# Patient Record
Sex: Female | Born: 1975 | Race: White | Hispanic: No | Marital: Married | Smoking: Former smoker
Health system: Southern US, Community
[De-identification: ages and names within clinical notes are randomized; demographics above are authoritative.]

## PROBLEM LIST (undated history)

## (undated) DIAGNOSIS — G43909 Migraine, unspecified, not intractable, without status migrainosus: Secondary | ICD-10-CM

## (undated) DIAGNOSIS — G510 Bell's palsy: Secondary | ICD-10-CM

## (undated) DIAGNOSIS — F988 Other specified behavioral and emotional disorders with onset usually occurring in childhood and adolescence: Secondary | ICD-10-CM

## (undated) HISTORY — DX: Migraine, unspecified, not intractable, without status migrainosus: G43.909

## (undated) HISTORY — PX: TUBAL LIGATION: SHX77

## (undated) HISTORY — PX: INCONTINENCE SURGERY: SHX676

## (undated) HISTORY — PX: HEMORRHOID SURGERY: SHX153

## (undated) HISTORY — DX: Bell's palsy: G51.0

## (undated) HISTORY — DX: Other specified behavioral and emotional disorders with onset usually occurring in childhood and adolescence: F98.8

---

## 1999-08-12 ENCOUNTER — Inpatient Hospital Stay (HOSPITAL_COMMUNITY): Admission: AD | Admit: 1999-08-12 | Discharge: 1999-08-12 | Payer: Self-pay | Admitting: Obstetrics

## 2004-04-04 ENCOUNTER — Emergency Department (HOSPITAL_COMMUNITY): Admission: EM | Admit: 2004-04-04 | Discharge: 2004-04-04 | Payer: Self-pay | Admitting: Emergency Medicine

## 2004-04-07 ENCOUNTER — Emergency Department (HOSPITAL_COMMUNITY): Admission: EM | Admit: 2004-04-07 | Discharge: 2004-04-07 | Payer: Self-pay | Admitting: Emergency Medicine

## 2004-10-14 ENCOUNTER — Encounter: Admission: RE | Admit: 2004-10-14 | Discharge: 2004-10-14 | Payer: Self-pay | Admitting: Family Medicine

## 2005-06-12 ENCOUNTER — Emergency Department (HOSPITAL_COMMUNITY): Admission: EM | Admit: 2005-06-12 | Discharge: 2005-06-12 | Payer: Self-pay | Admitting: Emergency Medicine

## 2006-03-08 ENCOUNTER — Emergency Department (HOSPITAL_COMMUNITY): Admission: EM | Admit: 2006-03-08 | Discharge: 2006-03-09 | Payer: Self-pay | Admitting: *Deleted

## 2006-03-10 ENCOUNTER — Emergency Department (HOSPITAL_COMMUNITY): Admission: EM | Admit: 2006-03-10 | Discharge: 2006-03-10 | Payer: Self-pay | Admitting: Emergency Medicine

## 2006-09-11 ENCOUNTER — Emergency Department (HOSPITAL_COMMUNITY): Admission: EM | Admit: 2006-09-11 | Discharge: 2006-09-11 | Payer: Self-pay | Admitting: Emergency Medicine

## 2006-11-02 ENCOUNTER — Emergency Department (HOSPITAL_COMMUNITY): Admission: EM | Admit: 2006-11-02 | Discharge: 2006-11-02 | Payer: Self-pay | Admitting: Family Medicine

## 2009-06-11 ENCOUNTER — Emergency Department (HOSPITAL_BASED_OUTPATIENT_CLINIC_OR_DEPARTMENT_OTHER): Admission: EM | Admit: 2009-06-11 | Discharge: 2009-06-12 | Payer: Self-pay | Admitting: Emergency Medicine

## 2009-06-12 ENCOUNTER — Ambulatory Visit: Payer: Self-pay | Admitting: Diagnostic Radiology

## 2011-01-08 LAB — URINALYSIS, ROUTINE W REFLEX MICROSCOPIC
Glucose, UA: NEGATIVE mg/dL
Specific Gravity, Urine: 1.02 (ref 1.005–1.030)
Urobilinogen, UA: 0.2 mg/dL (ref 0.0–1.0)
pH: 6.5 (ref 5.0–8.0)

## 2011-01-08 LAB — CBC
HCT: 36.8 % (ref 36.0–46.0)
Hemoglobin: 12.6 g/dL (ref 12.0–15.0)
RBC: 4.01 MIL/uL (ref 3.87–5.11)

## 2011-01-08 LAB — GC/CHLAMYDIA PROBE AMP, GENITAL: Chlamydia, DNA Probe: NEGATIVE

## 2011-01-08 LAB — WET PREP, GENITAL
Trich, Wet Prep: NONE SEEN
Yeast Wet Prep HPF POC: NONE SEEN

## 2011-01-08 LAB — BASIC METABOLIC PANEL
CO2: 30 mEq/L (ref 19–32)
Calcium: 9.3 mg/dL (ref 8.4–10.5)
GFR calc Af Amer: >60 mL/min (ref >60)
GFR calc non Af Amer: >60 mL/min (ref >60)
Potassium: 4.5 mEq/L (ref 3.5–5.1)
Sodium: 142 mEq/L (ref 135–145)

## 2011-01-08 LAB — URINE MICROSCOPIC-ADD ON

## 2011-01-08 LAB — DIFFERENTIAL
Eosinophils Relative: 1 % (ref 0–5)
Lymphocytes Relative: 31 % (ref 12–46)
Lymphs Abs: 2.3 10*3/uL (ref 0.7–4.0)
Monocytes Absolute: 0.5 10*3/uL (ref 0.1–1.0)
Monocytes Relative: 6 % (ref 3–12)
Neutro Abs: 4.5 10*3/uL (ref 1.7–7.7)

## 2011-03-08 ENCOUNTER — Inpatient Hospital Stay (INDEPENDENT_AMBULATORY_CARE_PROVIDER_SITE_OTHER)
Admission: RE | Admit: 2011-03-08 | Discharge: 2011-03-08 | Disposition: A | Discharge status: Home / Self Care | Source: Ambulatory Visit | Attending: Emergency Medicine | Admitting: Emergency Medicine

## 2011-03-08 ENCOUNTER — Ambulatory Visit (INDEPENDENT_AMBULATORY_CARE_PROVIDER_SITE_OTHER)

## 2011-03-08 DIAGNOSIS — S8010XA Contusion of unspecified lower leg, initial encounter: Secondary | ICD-10-CM

## 2011-03-26 ENCOUNTER — Encounter: Payer: Self-pay | Admitting: Family Medicine

## 2011-03-26 DIAGNOSIS — G43909 Migraine, unspecified, not intractable, without status migrainosus: Secondary | ICD-10-CM | POA: Insufficient documentation

## 2011-07-25 ENCOUNTER — Encounter (HOSPITAL_BASED_OUTPATIENT_CLINIC_OR_DEPARTMENT_OTHER): Payer: Self-pay | Admitting: *Deleted

## 2011-07-25 ENCOUNTER — Emergency Department (HOSPITAL_BASED_OUTPATIENT_CLINIC_OR_DEPARTMENT_OTHER)
Admission: EM | Admit: 2011-07-25 | Discharge: 2011-07-25 | Disposition: A | Discharge status: Home / Self Care | Attending: Emergency Medicine | Admitting: Emergency Medicine

## 2011-07-25 DIAGNOSIS — R51 Headache: Secondary | ICD-10-CM | POA: Insufficient documentation

## 2011-07-25 DIAGNOSIS — R110 Nausea: Secondary | ICD-10-CM | POA: Insufficient documentation

## 2011-07-25 MED ORDER — DIPHENHYDRAMINE HCL 50 MG/ML IJ SOLN
25.0000 mg | Freq: Once | INTRAMUSCULAR | Status: AC
  Administered 2011-07-25: 25 mg via INTRAVENOUS
  Filled 2011-07-25: qty 1

## 2011-07-25 MED ORDER — LORAZEPAM 2 MG/ML IJ SOLN
INTRAMUSCULAR | Status: AC
  Administered 2011-07-25: 1 mg via INTRAVENOUS
  Filled 2011-07-25: qty 1

## 2011-07-25 MED ORDER — LORAZEPAM 2 MG/ML IJ SOLN
1.0000 mg | Freq: Once | INTRAMUSCULAR | Status: AC
  Administered 2011-07-25: 1 mg via INTRAVENOUS

## 2011-07-25 MED ORDER — KETOROLAC TROMETHAMINE 30 MG/ML IJ SOLN
30.0000 mg | Freq: Once | INTRAMUSCULAR | Status: AC
  Administered 2011-07-25: 30 mg via INTRAVENOUS
  Filled 2011-07-25: qty 1

## 2011-07-25 MED ORDER — METOCLOPRAMIDE HCL 5 MG/ML IJ SOLN
10.0000 mg | Freq: Once | INTRAMUSCULAR | Status: AC
  Administered 2011-07-25: 10 mg via INTRAVENOUS
  Filled 2011-07-25: qty 2

## 2011-07-25 NOTE — ED Notes (Signed)
Pt c/o feeling hot & "weird" after injections- NP made aware, order received

## 2011-07-25 NOTE — ED Notes (Signed)
Pt reports headache since 10/2- saw PCP last week and was given prednisone- reports headache has never gone away- also was given  maxalt on Friday without relief

## 2011-07-25 NOTE — ED Provider Notes (Signed)
History     CSN: [id number] Arrival date & time: 07/25/2011  3:56 PM   First MD Initiated Contact with Patient 07/25/11 1605      Chief Complaint  Patient presents with  . Headache    (Consider location/radiation/quality/duration/timing/severity/associated sxs/prior treatment) HPI Comments: Pt states that she has been having headache for 1 month:pt state that she was seen by her doctor 2 days ago and was given steroids and maxalt without relief  Patient is a 35 y.o. female presenting with headaches. The history is provided by the patient. No language interpreter was used.  Headache  This is a recurrent problem. The current episode started more than 1 week ago. The problem occurs constantly. The problem has not changed since onset.The headache is associated with bright light. The pain is located in the frontal region. The quality of the pain is described as throbbing. The pain is moderate. The pain does not radiate. Associated symptoms include nausea. Pertinent negatives include no fever, no near-syncope, no syncope, no shortness of breath and no vomiting.    Past Medical History  Diagnosis Date  . Migraines     Past Surgical History  Procedure Date  . Incontinence surgery   . Tubal ligation   . Hemorrhoid surgery     No family history on file.  History  Substance Use Topics  . Smoking status: Former Games developer  . Smokeless tobacco: Not on file  . Alcohol Use: No    OB History    Grav Para Term Preterm Abortions TAB SAB Ect Mult Living                  Review of Systems  Constitutional: Negative for fever.  Respiratory: Negative for shortness of breath.   Cardiovascular: Negative for syncope and near-syncope.  Gastrointestinal: Positive for nausea. Negative for vomiting.  Neurological: Positive for headaches.  All other systems reviewed and are negative.    Allergies  Codeine; Penicillins; and Treximet  Home Medications   Current Outpatient Rx  Name Route  Sig Dispense Refill  . ACETAMINOPHEN 500 MG PO TABS Oral Take 1,000 mg by mouth every 6 (six) hours as needed. For pain     . CLONAZEPAM 1 MG PO TABS Oral Take 1 mg by mouth 4 (four) times a week. Take Monday - Thursday     . HYDROCODONE-ACETAMINOPHEN 5-500 MG PO TABS Oral Take 1-2 tablets by mouth every 6 (six) hours as needed. For pain     . RIZATRIPTAN BENZOATE 10 MG PO TBDP Oral Take 10 mg by mouth as needed. May repeat in 2 hours if needed     . ALPRAZOLAM 0.25 MG PO TABS Oral Take 0.25 mg by mouth 3 (three) times daily as needed.      . METHYLPHENIDATE HCL 10 MG PO CP24 Oral Take 10 mg by mouth as needed.        BP 109/55  Pulse 70  Temp(Src) 98.6 F (37 C) (Oral)  Resp 20  Ht 5\' 4"  (1.626 m)  Wt 140 lb (63.504 kg)  BMI 24.03 kg/m2  SpO2 98%  LMP 07/25/2011  Physical Exam  Nursing note and vitals reviewed. Constitutional: She is oriented to person, place, and time. She appears well-developed and well-nourished.  HENT:  Head: Normocephalic and atraumatic.  Right Ear: External ear normal.  Left Ear: External ear normal.  Mouth/Throat: Oropharynx is clear and moist.  Eyes: Conjunctivae are normal. Pupils are equal, round, and reactive to light.  Neck: Normal range  of motion.  Cardiovascular: Normal rate and regular rhythm.   Pulmonary/Chest: Effort normal and breath sounds normal.  Musculoskeletal: Normal range of motion.  Neurological: She is alert and oriented to person, place, and time.  Skin: Skin is warm and dry.  Psychiatric: She has a normal mood and affect.    ED Course  Procedures (including critical care time)  Labs Reviewed - No data to display No results found.   1. Headache       MDM  Pt is feeling better and ready to go home:pt sleeping in room   Medical screening examination/treatment/procedure(s) were performed by non-physician practitioner and as supervising physician I was immediately available for consultation/collaboration. Osvaldo Human, M.D.     Teressa Lower, NP 07/25/11 1827  Carleene Cooper III, MD 07/27/11 [phone]

## 2011-10-05 HISTORY — PX: BREAST ENHANCEMENT SURGERY: SHX7

## 2012-05-15 ENCOUNTER — Other Ambulatory Visit: Payer: Self-pay | Admitting: Family Medicine

## 2012-05-15 ENCOUNTER — Ambulatory Visit
Admission: RE | Admit: 2012-05-15 | Discharge: 2012-05-15 | Disposition: A | Discharge status: Home / Self Care | Source: Ambulatory Visit | Attending: Family Medicine | Admitting: Family Medicine

## 2012-05-15 DIAGNOSIS — R52 Pain, unspecified: Secondary | ICD-10-CM

## 2012-10-26 ENCOUNTER — Emergency Department (HOSPITAL_BASED_OUTPATIENT_CLINIC_OR_DEPARTMENT_OTHER)
Admission: EM | Admit: 2012-10-26 | Discharge: 2012-10-26 | Disposition: A | Discharge status: Home / Self Care | Attending: Emergency Medicine | Admitting: Emergency Medicine

## 2012-10-26 ENCOUNTER — Encounter (HOSPITAL_BASED_OUTPATIENT_CLINIC_OR_DEPARTMENT_OTHER): Payer: Self-pay | Admitting: *Deleted

## 2012-10-26 DIAGNOSIS — Z8679 Personal history of other diseases of the circulatory system: Secondary | ICD-10-CM | POA: Insufficient documentation

## 2012-10-26 DIAGNOSIS — Z87891 Personal history of nicotine dependence: Secondary | ICD-10-CM | POA: Insufficient documentation

## 2012-10-26 DIAGNOSIS — J329 Chronic sinusitis, unspecified: Secondary | ICD-10-CM

## 2012-10-26 DIAGNOSIS — Z79899 Other long term (current) drug therapy: Secondary | ICD-10-CM | POA: Insufficient documentation

## 2012-10-26 MED ORDER — LEVOFLOXACIN 750 MG PO TABS: 750.0000 mg | ORAL_TABLET | Freq: Every day | ORAL | Status: DC

## 2012-10-26 MED ORDER — FLUTICASONE PROPIONATE 50 MCG/ACT NA SUSP: 2.0000 | Freq: Every day | NASAL | Status: DC

## 2012-10-26 NOTE — ED Notes (Signed)
Pt c/o h/a and sinus congestion x 1 day

## 2012-10-26 NOTE — ED Provider Notes (Signed)
Medical screening examination/treatment/procedure(s) were performed by non-physician practitioner and as supervising physician I was immediately available for consultation/collaboration.   Lakresha Stifter, MD 10/26/12 2333 

## 2012-10-26 NOTE — ED Provider Notes (Signed)
History     CSN: [id number]  Arrival date & time 10/26/12  1845   First MD Initiated Contact with Patient 10/26/12 1858      Chief Complaint  Patient presents with  . Headache    (Consider location/radiation/quality/duration/timing/severity/associated sxs/prior treatment) HPI Comments: Pt states that she has a history of sinusitis and she feels like the symptoms started again today:pt denies fever:pt state that she is followed by [facility]  Patient is a 37 y.o. female presenting with headaches. The history is provided by the patient. No language interpreter was used.  Headache  This is a new problem. The current episode started 2 days ago. The problem occurs constantly. The problem has not changed since onset.The pain is located in the frontal region. The quality of the pain is described as throbbing. The pain is moderate. The pain does not radiate.    Past Medical History  Diagnosis Date  . Migraines     Past Surgical History  Procedure Date  . Incontinence surgery   . Tubal ligation   . Hemorrhoid surgery     History reviewed. No pertinent family history.  History  Substance Use Topics  . Smoking status: Former Games developer  . Smokeless tobacco: Not on file  . Alcohol Use: No    OB History    Grav Para Term Preterm Abortions TAB SAB Ect Mult Living                  Review of Systems  Constitutional: Negative.   Respiratory: Negative.   Cardiovascular: Negative.   Neurological: Positive for headaches.    Allergies  Codeine; Penicillins; and Treximet  Home Medications   Current Outpatient Rx  Name  Route  Sig  Dispense  Refill  . ACETAMINOPHEN 500 MG PO TABS   Oral   Take 1,000 mg by mouth every 6 (six) hours as needed. For pain          . TOPIRAMATE 100 MG PO TABS   Oral   Take 100 mg by mouth 2 (two) times daily.         Marland Kitchen ALPRAZOLAM 0.25 MG PO TABS   Oral   Take 0.25 mg by mouth 3 (three) times daily as needed.           Marland Kitchen  CLONAZEPAM 1 MG PO TABS   Oral   Take 1 mg by mouth 4 (four) times a week. Take Monday - Thursday          . FLUTICASONE PROPIONATE 50 MCG/ACT NA SUSP   Nasal   Place 2 sprays into the nose daily.   16 g   2   . HYDROCODONE-ACETAMINOPHEN 5-500 MG PO TABS   Oral   Take 1-2 tablets by mouth every 6 (six) hours as needed. For pain          . LEVOFLOXACIN 750 MG PO TABS   Oral   Take 1 tablet (750 mg total) by mouth daily.   5 tablet   0   . METHYLPHENIDATE HCL ER (LA) 10 MG PO CP24   Oral   Take 10 mg by mouth as needed.           Marland Kitchen RIZATRIPTAN BENZOATE 10 MG PO TBDP   Oral   Take 10 mg by mouth as needed. May repeat in 2 hours if needed            BP 124/74  Pulse 83  Temp 98.2 F (36.8 C) (Oral)  Resp 16  Ht 5\' 4"  (1.626 m)  Wt 143 lb (64.864 kg)  BMI 24.55 kg/m2  SpO2 100%  LMP 10/05/2012  Physical Exam  Nursing note and vitals reviewed. Constitutional: She appears well-developed and well-nourished.  HENT:  Right Ear: External ear normal.  Left Ear: External ear normal.  Nose: Mucosal edema present. Right sinus exhibits frontal sinus tenderness. Left sinus exhibits frontal sinus tenderness.  Neck: Normal range of motion. Neck supple.  Cardiovascular: Normal rate and regular rhythm.   Pulmonary/Chest: Effort normal and breath sounds normal.  Musculoskeletal: Normal range of motion.  Neurological: She is alert.  Skin: Skin is warm and dry.  Psychiatric: She has a normal mood and affect.    ED Course  Procedures (including critical care time)  Labs Reviewed - No data to display No results found.   1. Sinusitis       MDM  Pt treat for sinusitis with levaquin and flonase        Teressa Lower, NP 10/26/12 2112

## 2012-10-26 NOTE — ED Notes (Signed)
Pt reports waking up with severe headache around 7 am, reports nose bleed to left nare this am bleeding controlled at that time, no bleeding noted at this time. Pt has taken tylenol 3 times today last being around 1600 with no relief.

## 2012-11-04 ENCOUNTER — Emergency Department (HOSPITAL_COMMUNITY)
Admission: EM | Admit: 2012-11-04 | Discharge: 2012-11-04 | Disposition: A | Discharge status: Home / Self Care | Attending: Emergency Medicine | Admitting: Emergency Medicine

## 2012-11-04 ENCOUNTER — Emergency Department (HOSPITAL_COMMUNITY)

## 2012-11-04 ENCOUNTER — Encounter (HOSPITAL_COMMUNITY): Payer: Self-pay | Admitting: Emergency Medicine

## 2012-11-04 DIAGNOSIS — M5410 Radiculopathy, site unspecified: Secondary | ICD-10-CM

## 2012-11-04 DIAGNOSIS — M5412 Radiculopathy, cervical region: Secondary | ICD-10-CM | POA: Insufficient documentation

## 2012-11-04 DIAGNOSIS — Z79899 Other long term (current) drug therapy: Secondary | ICD-10-CM | POA: Insufficient documentation

## 2012-11-04 DIAGNOSIS — G43909 Migraine, unspecified, not intractable, without status migrainosus: Secondary | ICD-10-CM | POA: Insufficient documentation

## 2012-11-04 DIAGNOSIS — Z87891 Personal history of nicotine dependence: Secondary | ICD-10-CM | POA: Insufficient documentation

## 2012-11-04 DIAGNOSIS — M25512 Pain in left shoulder: Secondary | ICD-10-CM

## 2012-11-04 MED ORDER — HYDROCODONE-ACETAMINOPHEN 5-325 MG PO TABS: 2.0000 | ORAL_TABLET | ORAL | Status: DC | PRN

## 2012-11-04 MED ORDER — PREDNISONE 20 MG PO TABS: 40.0000 mg | ORAL_TABLET | Freq: Every day | ORAL | Status: DC

## 2012-11-04 MED ORDER — NAPROXEN 500 MG PO TABS: 500.0000 mg | ORAL_TABLET | Freq: Two times a day (BID) | ORAL | Status: DC

## 2012-11-04 NOTE — ED Notes (Signed)
Pt c/o lt shoulder pain since yesterday.  States that it only hurts when she lifts it above her head.  Does not remember what she did to it.

## 2012-11-04 NOTE — ED Provider Notes (Signed)
History   This chart was scribed for non-physician practitioner working with Hurman Horn, MD by Frederik Pear, ED Scribe. This patient was seen in room WTR7/WTR7 and the patient's care was started at 1749.   CSN: [id number]  Arrival date & time 11/04/12  1627   First MD Initiated Contact with Patient 11/04/12 1749      Chief Complaint  Patient presents with  . Shoulder Pain    (Consider location/radiation/quality/duration/timing/severity/associated sxs/prior treatment) The history is provided by the patient. No language interpreter was used.    Tracey Malone is a 37 y.o. female who presents to the Emergency Department complaining of constant, moderate left anterior shoulder pain that is aggravated by lifting it above her head that began last night. She denies paresthesia to her left area, but reports that the sensation feels different than baseline. She denies any injury to the area as well having lifted any boxes, weights, children, or other heavy objects. She reports that she has treated the pain with Tylenol with no relief. She reports a h/o of pain in other joints. She has no chronic medical conditions, including arthritis, that she require daily medications.  She rates her pain as moderate, located in the left shoulder with radiation down the left arm, worsens with movement and nothing makes it better.  It is associated with numbness of the left hand and arm.    Past Medical History  Diagnosis Date  . Migraines     Past Surgical History  Procedure Date  . Incontinence surgery   . Tubal ligation   . Hemorrhoid surgery     History reviewed. No pertinent family history.  History  Substance Use Topics  . Smoking status: Former Games developer  . Smokeless tobacco: Not on file  . Alcohol Use: No    OB History    Grav Para Term Preterm Abortions TAB SAB Ect Mult Living                  Review of Systems  Constitutional: Negative for fever, diaphoresis, appetite change,  fatigue and unexpected weight change.  HENT: Negative for mouth sores and neck stiffness.   Eyes: Negative for visual disturbance.  Respiratory: Negative for cough, chest tightness, shortness of breath and wheezing.   Cardiovascular: Negative for chest pain.  Gastrointestinal: Negative for nausea, vomiting, abdominal pain, diarrhea and constipation.  Genitourinary: Negative for dysuria, urgency, frequency and hematuria.  Musculoskeletal: Negative for back pain.       Shoulder pain  Skin: Negative for rash.  Neurological: Negative for syncope, light-headedness and headaches.  Hematological: Does not bruise/bleed easily.  Psychiatric/Behavioral: Negative for sleep disturbance. The patient is not nervous/anxious.   All other systems reviewed and are negative.    Allergies  Codeine; Penicillins; and Treximet  Home Medications   Current Outpatient Rx  Name  Route  Sig  Dispense  Refill  . ACETAMINOPHEN 500 MG PO TABS   Oral   Take 1,000 mg by mouth every 6 (six) hours as needed. For pain          . FLUTICASONE PROPIONATE 50 MCG/ACT NA SUSP   Nasal   Place 2 sprays into the nose daily.   16 g   2   . LEVOFLOXACIN 750 MG PO TABS   Oral   Take 1 tablet (750 mg total) by mouth daily.   5 tablet   0   . TOPIRAMATE 100 MG PO TABS   Oral   Take 100 mg  by mouth 2 (two) times daily.         Marland Kitchen HYDROCODONE-ACETAMINOPHEN 5-325 MG PO TABS   Oral   Take 2 tablets by mouth every 4 (four) hours as needed for pain.   10 tablet   0   . NAPROXEN 500 MG PO TABS   Oral   Take 1 tablet (500 mg total) by mouth 2 (two) times daily with a meal.   30 tablet   0   . PREDNISONE 20 MG PO TABS   Oral   Take 2 tablets (40 mg total) by mouth daily.   10 tablet   0     BP 115/67  Pulse 87  Temp 98 F (36.7 C) (Oral)  Resp 18  SpO2 100%  LMP 10/05/2012  Physical Exam  Nursing note and vitals reviewed. Constitutional: She is oriented to person, place, and time. She appears  well-developed and well-nourished. No distress.  HENT:  Head: Normocephalic and atraumatic.  Mouth/Throat: Oropharynx is clear and moist. No oropharyngeal exudate.  Eyes: Conjunctivae normal are normal. No scleral icterus.  Neck: Normal range of motion. Neck supple.  Cardiovascular: Normal rate, regular rhythm and intact distal pulses.   Pulmonary/Chest: Effort normal and breath sounds normal. No respiratory distress. She has no wheezes.  Abdominal: Soft. Bowel sounds are normal. She exhibits no mass. There is no tenderness. There is no rebound and no guarding.  Musculoskeletal: Normal range of motion. She exhibits no edema.       Some decreased ROM secondary to pain with adduction.  Adduction more decreased with active than passive. Full rotational ROM. Strong equal grip strength bilaterally. Strength 5/5 in all nerve distributions.  Decreased sensation to sharp and dull in the radial distribution in the left hand and forearm. Intact 2 point discrimination in all nerve distribution.   Lymphadenopathy:    She has no cervical adenopathy.  Neurological: She is alert and oriented to person, place, and time. No cranial nerve deficit. She exhibits normal muscle tone. Coordination normal.       Speech is clear and goal oriented Moves extremities without ataxia  Skin: Skin is warm and dry. She is not diaphoretic.  Psychiatric: She has a normal mood and affect.    ED Course  Procedures (including critical care time)  DIAGNOSTIC STUDIES: Oxygen Saturation is 100% on room air, normal by my interpretation.    COORDINATION OF CARE:  20:20- Discussed planned course of treatment with the patient, including antiinflammatories, pain and following up with an orthopedist, who is agreeable at this time.   Labs Reviewed - No data to display Dg Shoulder Left  11/04/2012  *RADIOLOGY REPORT*  Clinical Data: Left shoulder pain, no known injury  LEFT SHOULDER - 2+ VIEW  Comparison: None.  Findings: No  fracture or dislocation is seen.  The joint spaces are preserved.  The visualized soft tissues are unremarkable.  Visualized left lung is clear.  IMPRESSION: Normal left shoulder radiographs.   Original Report Authenticated By: Charline Bills, M.D.      1. Left anterior shoulder pain   2. Radiculopathy of arm       MDM  Ty Hilts presents with nontraumatic L shoulder pain.  X-ray without bony abnormality.  Pt with radicular symptoms on exam and decreased ROM 2/2 pain.  Pt without neck pain but this does not exclude a cervical radiculopathy.  Will sling arm for comfort and d/c home with steroids, pain medication and anti-inflammatories. Pt has been instructed to  f/u with orthopedics for futher evaluation and outpatient imaging.  I have also discussed reasons to return immediately to the ER.  Patient expresses understanding and agrees with plan.  1. Medications: Naprosyn, Vicodin, prednisone, usual home medications 2. Treatment: rest, drink plenty of fluids, use sling for comfort 3. Follow Up: Please followup with your primary doctor for discussion of your diagnoses and further evaluation after today's visit; followup with [facility] orthopedics for further evaluation of your shoulder pain   I personally performed the services described in this documentation, which was scribed in my presence. The recorded information has been reviewed and is accurate.     Dahlia Client Grayling Schranz, PA-C 11/05/12 0030

## 2012-11-05 NOTE — ED Provider Notes (Signed)
Medical screening examination/treatment/procedure(s) were performed by non-physician practitioner and as supervising physician I was immediately available for consultation/collaboration.  Hurman Horn, MD 11/05/12 1215

## 2013-01-26 ENCOUNTER — Ambulatory Visit (INDEPENDENT_AMBULATORY_CARE_PROVIDER_SITE_OTHER): Admitting: Physician Assistant

## 2013-01-26 ENCOUNTER — Encounter: Payer: Self-pay | Admitting: Physician Assistant

## 2013-01-26 VITALS — BP 132/86 | HR 108 | Temp 98.2°F | Resp 18 | Ht 63.75 in | Wt 146.0 lb

## 2013-01-26 DIAGNOSIS — B9689 Other specified bacterial agents as the cause of diseases classified elsewhere: Secondary | ICD-10-CM

## 2013-01-26 DIAGNOSIS — J988 Other specified respiratory disorders: Secondary | ICD-10-CM

## 2013-01-26 DIAGNOSIS — A499 Bacterial infection, unspecified: Secondary | ICD-10-CM

## 2013-01-26 MED ORDER — AZITHROMYCIN 250 MG PO TABS: ORAL_TABLET | ORAL | Status: DC

## 2013-01-26 MED ORDER — BENZONATATE 200 MG PO CAPS: 200.0000 mg | ORAL_CAPSULE | Freq: Two times a day (BID) | ORAL | Status: DC | PRN

## 2013-01-26 NOTE — Progress Notes (Signed)
Patient ID: QUEEN ABBETT MRN: [id number], DOB: 11/22/1975, 37 y.o. Date of Encounter: 01/26/2013, 2:48 PM    Chief Complaint:  Chief Complaint  Patient presents with  . c/o upper respiratory     HPI: 37 y.o. year old female says she has "gotten same thing her husband has." Her husband has been sick for a full week and has thick dark phlegm.  Pt has lots of drainage in throat but has not been able to cough out any phlegm. Has cough. Has a lot of pressure in head. Not blowing much from nose. This morning left ear felt like "fluid behind it." No sore throat.No other ear pain. No fever/chills.      Home Meds: Current Outpatient Prescriptions on File Prior to Visit  Medication Sig Dispense Refill  . acetaminophen (TYLENOL) 500 MG tablet Take 1,000 mg by mouth every 6 (six) hours as needed. For pain       . topiramate (TOPAMAX) 100 MG tablet Take 100 mg by mouth 2 (two) times daily.      . fluticasone (FLONASE) 50 MCG/ACT nasal spray Place 2 sprays into the nose daily.  16 g  2  . HYDROcodone-acetaminophen (NORCO/VICODIN) 5-325 MG per tablet Take 2 tablets by mouth every 4 (four) hours as needed for pain.  10 tablet  0  . levofloxacin (LEVAQUIN) 750 MG tablet Take 1 tablet (750 mg total) by mouth daily.  5 tablet  0  . naproxen (NAPROSYN) 500 MG tablet Take 1 tablet (500 mg total) by mouth 2 (two) times daily with a meal.  30 tablet  0  . predniSONE (DELTASONE) 20 MG tablet Take 2 tablets (40 mg total) by mouth daily.  10 tablet  0   No current facility-administered medications on file prior to visit.    Allergies:  Allergies  Allergen Reactions  . Codeine Shortness Of Breath  . Penicillins Swelling  . Treximet (Sumatriptan-Naproxen Sodium)     Throat gets tight.      Review of Systems: See HPI for pertinent ros. All others negative.    Physical Exam: Blood pressure 132/86, pulse 108, temperature 98.2 F (36.8 C), temperature source Oral, resp. rate 18, height 5' 3.75"  (1.619 m), weight 146 lb (66.225 kg)., Body mass index is 25.27 kg/(m^2). General: Well developed, well nourished, in no acute distress. HEENT: Normocephalic, atraumatic, eyes without discharge, sclera non-icteric, nares are without discharge. Bilateral auditory canals clear, TM's are without perforation, pearly grey and translucent with reflective cone of light bilaterally. Oral cavity moist, posterior pharynx without exudate, erythema, peritonsillar abscess, or post nasal drip.Sinuses with no tenderness with percusion.  Neck: Supple. No thyromegaly. Full ROM. No lymphadenopathy. Lungs: Clear bilaterally to auscultation without wheezes, rales, or rhonchi. Breathing is unlabored. Heart: Regular rhythm. No murmurs, rubs, or gallops. Msk:  Strength and tone normal for age. Extremities/Skin: Warm and dry. No clubbing or cyanosis. No edema. No rashes or suspicious lesions. Neuro: Alert and oriented X 3. Moves all extremities spontaneously. Gait is normal. CNII-XII grossly in tact. Psych:  Responds to questions appropriately with a normal affect.     ASSESSMENT AND PLAN:  37 y.o. year old female with  1. Bacterial respiratory infection - azithromycin (ZITHROMAX) 250 MG tablet; Day 1: Take 2 pills daily Days 2-5: Take one pill daily  Dispense: 6 tablet; Refill: 0 - benzonatate (TESSALON) 200 MG capsule; Take 1 capsule (200 mg total) by mouth 2 (two) times daily as needed for cough.  Dispense: 20 capsule;  Refill: 0  F/u if worsens/does not resolve.  Murray Hodgkins [place], [place], [facility] 01/26/2013 2:48 PM

## 2013-03-13 ENCOUNTER — Other Ambulatory Visit: Payer: Self-pay | Admitting: Family Medicine

## 2013-03-13 NOTE — Telephone Encounter (Signed)
?   OK to Refill  

## 2013-03-13 NOTE — Telephone Encounter (Signed)
Ok to refill 

## 2013-06-27 ENCOUNTER — Telehealth: Payer: Self-pay | Admitting: Family Medicine

## 2013-06-27 ENCOUNTER — Encounter: Payer: Self-pay | Admitting: Family Medicine

## 2013-06-27 ENCOUNTER — Other Ambulatory Visit: Payer: Self-pay | Admitting: Family Medicine

## 2013-06-27 ENCOUNTER — Ambulatory Visit (INDEPENDENT_AMBULATORY_CARE_PROVIDER_SITE_OTHER): Admitting: Family Medicine

## 2013-06-27 VITALS — BP 100/60 | HR 80 | Temp 97.3°F | Resp 16 | Wt 147.5 lb

## 2013-06-27 DIAGNOSIS — N3090 Cystitis, unspecified without hematuria: Secondary | ICD-10-CM

## 2013-06-27 DIAGNOSIS — Z23 Encounter for immunization: Secondary | ICD-10-CM

## 2013-06-27 DIAGNOSIS — K219 Gastro-esophageal reflux disease without esophagitis: Secondary | ICD-10-CM

## 2013-06-27 DIAGNOSIS — F988 Other specified behavioral and emotional disorders with onset usually occurring in childhood and adolescence: Secondary | ICD-10-CM

## 2013-06-27 DIAGNOSIS — N390 Urinary tract infection, site not specified: Secondary | ICD-10-CM

## 2013-06-27 LAB — URINALYSIS, ROUTINE W REFLEX MICROSCOPIC
Glucose, UA: NEGATIVE mg/dL
Specific Gravity, Urine: 1.015 (ref 1.005–1.030)
pH: 7 (ref 5.0–8.0)

## 2013-06-27 LAB — URINALYSIS, MICROSCOPIC ONLY
Casts: NONE SEEN
Crystals: NONE SEEN

## 2013-06-27 MED ORDER — DEXLANSOPRAZOLE 30 MG PO CPDR: 30.0000 mg | DELAYED_RELEASE_CAPSULE | Freq: Every day | ORAL | Status: DC

## 2013-06-27 MED ORDER — METHYLPHENIDATE HCL ER (LA) 10 MG PO CP24: 10.0000 mg | ORAL_CAPSULE | Freq: Every day | ORAL | Status: DC

## 2013-06-27 MED ORDER — FLUCONAZOLE 150 MG PO TABS: 150.0000 mg | ORAL_TABLET | Freq: Once | ORAL | Status: DC

## 2013-06-27 MED ORDER — TOPIRAMATE 100 MG PO TABS: 100.0000 mg | ORAL_TABLET | Freq: Every day | ORAL | Status: DC

## 2013-06-27 MED ORDER — PANTOPRAZOLE SODIUM 40 MG PO TBEC: 40.0000 mg | DELAYED_RELEASE_TABLET | Freq: Every day | ORAL | Status: DC

## 2013-06-27 MED ORDER — CIPROFLOXACIN HCL 500 MG PO TABS: 500.0000 mg | ORAL_TABLET | Freq: Two times a day (BID) | ORAL | Status: DC

## 2013-06-27 NOTE — Telephone Encounter (Signed)
Insurance will not pay for dexilant DR 30 mg cap 1 QD. They are saying that she must try Omeprazole or Nexium or pantoprazole first.

## 2013-06-27 NOTE — Assessment & Plan Note (Addendum)
Meds refilled, uses very rarely for concentration Chart reviewed and dose reviewed, given 30 tablets

## 2013-06-27 NOTE — Assessment & Plan Note (Signed)
cipro given Dilfucan for yeast

## 2013-06-27 NOTE — Telephone Encounter (Signed)
Ok to switch and which one would you like to switch too

## 2013-06-27 NOTE — Assessment & Plan Note (Signed)
Trial of dexilant  

## 2013-06-27 NOTE — Progress Notes (Signed)
  Subjective:    Patient ID: Tracey Malone, female    DOB: [birth year], 37 y.o.   MRN: [id number]  HPI   Patient here with dysuria for the past 4 days. She has history of bladder mesh as well as a urethral sling that was placed in 2012. She has history of recurrent UTI in the past. She denies any abdominal pain, fever, nausea vomiting associated.  She also complains of acid reflux worsened over the past few months she's been trying to control with diet has been using a lot of TUMS. She will like to try Paxil and she knows her insurance covers this.  Refill needed on her Topamax as well as her Ritalin she uses as needed for ADD    Review of Systems  GEN- denies fatigue, fever, weight loss,weakness, recent illness HEENT- denies eye drainage, change in vision, nasal discharge, CVS- denies chest pain, palpitations RESP- denies SOB, cough, wheeze ABD- denies N/V, change in stools, abd pain GU- +dysuria, hematuria, dribbling, incontinence MSK- denies joint pain, muscle aches, injury Neuro- denies headache, dizziness, syncope, seizure activity      Objective:   Physical Exam GEN- NAD, alert and oriented x3 CVS- RRR, no murmur RESP-CTAB ABD-NABS,soft, mild suprapubic tenderness, no rebound, no CVA tenderness EXT- No edema Pulses- Radial 2+        Assessment & Plan:

## 2013-06-27 NOTE — Patient Instructions (Signed)
Topamax changed to 100mg  tablet Dexilant as for acid reflux Cipro for UTI Diflucan in case of yeast infection Call if you do not improve F/U with Dr. Tanya Nones

## 2013-06-27 NOTE — Telephone Encounter (Signed)
Switch to protonix 40mg  1 tablet daily

## 2013-06-27 NOTE — Telephone Encounter (Signed)
Sent med to pharm

## 2013-06-28 NOTE — Telephone Encounter (Signed)
?   OK to Refill  

## 2013-06-28 NOTE — Telephone Encounter (Signed)
ok 

## 2013-08-09 ENCOUNTER — Ambulatory Visit (INDEPENDENT_AMBULATORY_CARE_PROVIDER_SITE_OTHER): Admitting: Family Medicine

## 2013-08-09 ENCOUNTER — Encounter: Payer: Self-pay | Admitting: Family Medicine

## 2013-08-09 VITALS — BP 110/70 | HR 74 | Temp 97.9°F | Resp 16 | Ht 65.0 in | Wt 147.0 lb

## 2013-08-09 DIAGNOSIS — Z23 Encounter for immunization: Secondary | ICD-10-CM

## 2013-08-09 DIAGNOSIS — Z0000 Encounter for general adult medical examination without abnormal findings: Secondary | ICD-10-CM

## 2013-08-09 NOTE — Progress Notes (Signed)
Subjective:    Patient ID: Tracey Malone, female    DOB: 29-Jan-1976, 37 y.o.   MRN: [id number]  HPI  Patient is a very pleasant 37 year old white female who comes in today for complete physical exam. She has a history of abnormal Pap smears. Her last aspirin was 4 years ago. She is evident for a Pap smear. Otherwise she has no major medical concerns the she has no family history of breast cancer or colon cancer. She has no family history of premature coronary artery disease. She is already had her flu shot this year. She is overdue Tdap. Past Medical History  Diagnosis Date  . Migraines    Current Outpatient Prescriptions on File Prior to Visit  Medication Sig Dispense Refill  . acetaminophen (TYLENOL) 500 MG tablet Take 1,000 mg by mouth every 6 (six) hours as needed. For pain       . ALPRAZolam (XANAX) 0.25 MG tablet TAKE 1 TABLET BY MOUTH 3 TIMES A DAY AS NEEDED  90 tablet  0  . methylphenidate (RITALIN LA) 10 MG 24 hr capsule Take 1 capsule (10 mg total) by mouth daily.  30 capsule  0  . pantoprazole (PROTONIX) 40 MG tablet Take 1 tablet (40 mg total) by mouth daily.  30 tablet  5  . topiramate (TOPAMAX) 100 MG tablet Take 1 tablet (100 mg total) by mouth at bedtime.  30 tablet  6   No current facility-administered medications on file prior to visit.   Allergies  Allergen Reactions  . Codeine Shortness Of Breath  . Penicillins Swelling  . Treximet [Sumatriptan-Naproxen Sodium]     Throat gets tight.   History   Social History  . Marital Status: Married    Spouse Name: N/A    Number of Children: N/A  . Years of Education: N/A   Occupational History  . Not on file.   Social History Main Topics  . Smoking status: Former Games developer  . Smokeless tobacco: Not on file  . Alcohol Use: No  . Drug Use: No  . Sexual Activity: Yes    Birth Control/ Protection: Surgical     Comment: Married to [employer], two kids.  Retired from [employer].   Other Topics Concern  . Not on file    Social History Narrative  . No narrative on file   Family History  Problem Relation Age of Onset  . COPD Father    Past Surgical History  Procedure Laterality Date  . Incontinence surgery    . Tubal ligation    . Hemorrhoid surgery       Review of Systems  All other systems reviewed and are negative.       Objective:   Physical Exam  Vitals reviewed. Constitutional: She is oriented to person, place, and time. She appears well-developed and well-nourished. No distress.  HENT:  Head: Normocephalic and atraumatic.  Right Ear: External ear normal.  Left Ear: External ear normal.  Nose: Nose normal.  Mouth/Throat: Oropharynx is clear and moist. No oropharyngeal exudate.  Eyes: Conjunctivae and EOM are normal. Pupils are equal, round, and reactive to light. Right eye exhibits no discharge. Left eye exhibits no discharge. No scleral icterus.  Neck: Normal range of motion. Neck supple. No JVD present. No tracheal deviation present. No thyromegaly present.  Cardiovascular: Normal rate, regular rhythm, normal heart sounds and intact distal pulses.  Exam reveals no gallop and no friction rub.   No murmur heard. Pulmonary/Chest: Effort normal and breath sounds normal.  No stridor. No respiratory distress. She has no wheezes. She has no rales. She exhibits no tenderness.  Abdominal: Soft. Bowel sounds are normal. She exhibits no distension and no mass. There is no tenderness. There is no rebound and no guarding.  Genitourinary: Vagina normal and uterus normal. No vaginal discharge found.  Musculoskeletal: Normal range of motion. She exhibits no edema and no tenderness.  Lymphadenopathy:    She has no cervical adenopathy.  Neurological: She is alert and oriented to person, place, and time. She has normal reflexes. She displays normal reflexes. No cranial nerve deficit. She exhibits normal muscle tone. Coordination normal.  Skin: Skin is warm. No rash noted. She is not diaphoretic. No  erythema. No pallor.  Psychiatric: She has a normal mood and affect. Her behavior is normal. Judgment and thought content normal.          Assessment & Plan:  1. Routine general medical examination at a health care facility His physical exam is completely normal. Her breast exam is normal. She has post surgical changes from breast enhancement surgery.  Pelvic exam is completely normal. Have asked the patient to return fasting for a CBC, CMP, fasting lipid panel and a TSH. She will receive her tetanus shot today. She has already received her flu shot. Pap smear was sent to pathology. Follow up in one year or as needed. - Pap IG (Image Guided)

## 2013-08-09 NOTE — Addendum Note (Signed)
Addended by: Legrand Rams B on: 08/09/2013 02:36 PM   Modules accepted: Orders

## 2013-08-10 ENCOUNTER — Other Ambulatory Visit

## 2013-08-10 DIAGNOSIS — Z0000 Encounter for general adult medical examination without abnormal findings: Secondary | ICD-10-CM

## 2013-08-10 LAB — COMPLETE METABOLIC PANEL WITH GFR
ALT: 21 U/L (ref 0–35)
Albumin: 4.6 g/dL (ref 3.5–5.2)
CO2: 24 mEq/L (ref 19–32)
Calcium: 9.8 mg/dL (ref 8.4–10.5)
Chloride: 108 mEq/L (ref 96–112)
Creat: 0.88 mg/dL (ref 0.50–1.10)
GFR, Est African American: >89 mL/min
Total Protein: 7.1 g/dL (ref 6.0–8.3)

## 2013-08-10 LAB — CBC WITH DIFFERENTIAL/PLATELET
Eosinophils Relative: 1 % (ref 0–5)
HCT: 37.8 % (ref 36.0–46.0)
Lymphocytes Relative: 24 % (ref 12–46)
Lymphs Abs: 1.7 10*3/uL (ref 0.7–4.0)
MCV: 87.9 fL (ref 78.0–100.0)
Platelets: 328 10*3/uL (ref 150–400)
RBC: 4.3 MIL/uL (ref 3.87–5.11)
WBC: 6.9 10*3/uL (ref 4.0–10.5)

## 2013-08-10 LAB — LIPID PANEL
LDL Cholesterol: 130 mg/dL — ABNORMAL HIGH (ref 0–99)
Triglycerides: 100 mg/dL (ref <150)

## 2013-08-10 LAB — TSH: TSH: 1.341 u[IU]/mL (ref 0.350–4.500)

## 2013-08-10 LAB — PAP IG (IMAGE GUIDED)

## 2013-08-13 ENCOUNTER — Other Ambulatory Visit: Payer: Self-pay | Admitting: Family Medicine

## 2013-08-20 ENCOUNTER — Encounter: Payer: Self-pay | Admitting: Family Medicine

## 2013-08-21 ENCOUNTER — Other Ambulatory Visit: Payer: Self-pay | Admitting: Family Medicine

## 2013-08-21 MED ORDER — CARBAMAZEPINE ER 200 MG PO TB12: ORAL_TABLET | ORAL | Status: DC

## 2013-09-23 ENCOUNTER — Other Ambulatory Visit: Payer: Self-pay | Admitting: Family Medicine

## 2013-09-25 MED ORDER — ALPRAZOLAM 0.25 MG PO TABS: ORAL_TABLET | ORAL | Status: DC

## 2013-09-25 NOTE — Telephone Encounter (Signed)
Rx Refilled  

## 2013-10-24 ENCOUNTER — Encounter: Payer: Self-pay | Admitting: Physician Assistant

## 2013-10-24 ENCOUNTER — Ambulatory Visit (INDEPENDENT_AMBULATORY_CARE_PROVIDER_SITE_OTHER): Admitting: Physician Assistant

## 2013-10-24 VITALS — BP 106/64 | HR 84 | Temp 97.3°F | Resp 18 | Wt 149.0 lb

## 2013-10-24 DIAGNOSIS — J069 Acute upper respiratory infection, unspecified: Secondary | ICD-10-CM

## 2013-10-24 DIAGNOSIS — B9689 Other specified bacterial agents as the cause of diseases classified elsewhere: Secondary | ICD-10-CM

## 2013-10-24 DIAGNOSIS — A499 Bacterial infection, unspecified: Secondary | ICD-10-CM

## 2013-10-24 MED ORDER — AZITHROMYCIN 250 MG PO TABS: ORAL_TABLET | ORAL | Status: DC

## 2013-10-24 NOTE — Progress Notes (Signed)
Patient ID: Tracey Malone I Bibby MRN: [id number], DOB: 11/01/1975, 38 y.o. Date of Encounter: 10/24/2013, 10:11 AM    Chief Complaint:  Chief Complaint  Patient presents with  . sick    runny nose, head hurts,ears stuffy     HPI: 38 y.o. year old white female states that she's been sick for over one week. Says that she has no chest congestion-- that everything is from her neck up. Says that it is mostly on her left side.  says that her left cheek to left ear area feels like there is a brick there. Has had no significant fever that she is aware of.     Home Meds: See attached medication section for any medications that were entered at today's visit. The computer does not put those onto this list.The following list is a list of meds entered prior to today's visit.   Current Outpatient Prescriptions on File Prior to Visit  Medication Sig Dispense Refill  . acetaminophen (TYLENOL) 500 MG tablet Take 1,000 mg by mouth every 6 (six) hours as needed. For pain       . ALPRAZolam (XANAX) 0.25 MG tablet TAKE 1 TABLET BY MOUTH 3 TIMES A DAY AS NEEDED  90 tablet  0  . methylphenidate (RITALIN LA) 10 MG 24 hr capsule Take 1 capsule (10 mg total) by mouth daily.  30 capsule  0  . pantoprazole (PROTONIX) 40 MG tablet Take 1 tablet (40 mg total) by mouth daily.  30 tablet  5  . topiramate (TOPAMAX) 100 MG tablet Take 1 tablet (100 mg total) by mouth at bedtime.  30 tablet  6   No current facility-administered medications on file prior to visit.    Allergies:  Allergies  Allergen Reactions  . Codeine Shortness Of Breath  . Penicillins Swelling  . Treximet [Sumatriptan-Naproxen Sodium]     Throat gets tight.      Review of Systems: See HPI for pertinent ROS. All other ROS negative.    Physical Exam: Blood pressure 106/64, pulse 84, temperature 97.3 F (36.3 C), temperature source Oral, resp. rate 18, weight 149 lb (67.586 kg)., Body mass index is 24.79 kg/(m^2). General:  WNWD WF Appears  in no acute distress. HEENT: Normocephalic, atraumatic, eyes without discharge, sclera non-icteric, nares are without discharge. Bilateral auditory canals clear, TM's are without perforation, pearly grey and translucent with reflective cone of light bilaterally. Oral cavity moist, posterior pharynx without exudate, erythema, peritonsillar abscess. Left maxillary sinus is tender with percussion. Right maxillary sinuses not tender. Frontal sinuses are nontender with percussion.  Neck: Supple. No thyromegaly. No lymphadenopathy. Lungs: Clear bilaterally to auscultation without wheezes, rales, or rhonchi. Breathing is unlabored. Heart: Regular rhythm. No murmurs, rubs, or gallops. Msk:  Strength and tone normal for age. Extremities/Skin: Warm and dry. No clubbing or cyanosis. No edema. No rashes or suspicious lesions. Neuro: Alert and oriented X 3. Moves all extremities spontaneously. Gait is normal. CNII-XII grossly in tact. Psych:  Responds to questions appropriately with a normal affect.     ASSESSMENT AND PLAN:  38 y.o. year old female with  1. Bacterial upper respiratory infection Penicillin allergy. - azithromycin (ZITHROMAX) 250 MG tablet; Day 1: Take 2 daily.  Days 2-5: Take 1 daily.  Dispense: 6 tablet; Refill: 0 Complete antibiotic. If symptoms do not resolve within one week after completion of antibiotics and followup. And use over-the-counter decongestants as it for symptom relief if needed.  [address]igned, Mary Beth Vega [place], [place], [facility] 10/24/2013 10:11  AM    

## 2013-11-07 ENCOUNTER — Ambulatory Visit (INDEPENDENT_AMBULATORY_CARE_PROVIDER_SITE_OTHER): Admitting: Physician Assistant

## 2013-11-07 ENCOUNTER — Encounter: Payer: Self-pay | Admitting: Physician Assistant

## 2013-11-07 VITALS — BP 104/70 | HR 88 | Temp 97.3°F | Resp 18 | Ht 65.0 in | Wt 150.0 lb

## 2013-11-07 DIAGNOSIS — B9789 Other viral agents as the cause of diseases classified elsewhere: Secondary | ICD-10-CM

## 2013-11-07 DIAGNOSIS — R509 Fever, unspecified: Secondary | ICD-10-CM

## 2013-11-07 DIAGNOSIS — J988 Other specified respiratory disorders: Secondary | ICD-10-CM

## 2013-11-07 DIAGNOSIS — E669 Obesity, unspecified: Secondary | ICD-10-CM

## 2013-11-07 LAB — INFLUENZA A AND B
Inflenza A Ag: NEGATIVE
Influenza B Ag: NEGATIVE

## 2013-11-07 MED ORDER — PHENTERMINE HCL 37.5 MG PO CAPS: 37.5000 mg | ORAL_CAPSULE | ORAL | Status: DC

## 2013-11-08 NOTE — Progress Notes (Signed)
Patient ID: Ty HiltsMichelle I Scaff MRN: [id number], DOB: [birth year], 38 y.o. Date of Encounter: @DATE @  Chief Complaint:  Chief Complaint  Patient presents with  . sick x 1 day    cough, itchy eyes, burning nose,  no aches or chills    HPI: 38 y.o. year old white female  presents with the following complaints:  She had an office visit with me on 10/24/12. At that time she been sick for one week with a lot of pressure in her left cheek and ear. Had no chest congestion. Was prescribed a Z-Pak which she completed and her symptoms completely resolved. She remained symptom free in this aspect until just yesterday. Yesterday developed a burning sensation in her nose is also having runny nose and sneezing. Some mild cough that seems to be secondary to postnasal drip. No sore throat, no ear ache, no significant chest congestion. No fevers or chills.  She has a history of migraine headaches and was prescribed Topamax. However this is causing significant cognitive slowing. She says that at work she is typing  the same thing repeatedly. Sometimes she is talking to her kids and will start a sentence and then there will be a long pause before she can complete the sentence. Her kids ask her what is wrong with her and she says that she took her Topamax. She says that she is very concerned that this is going to cause an error at work when she is adding up money etc. therefore she is already started weaning herself off of the Topamax herself. Says that her migraines are not as bad as they were in the past.  She says that she quit smoking and her weight has increased significantly. She says that when Dr. Tanya Nonespickard prescribed Topamax he discussed the fact that one positive with the Topamax is that it can help with weight management. She is upset that her weight is up significantly and thinks that now she is going off of the Topamax her weight is going to increase even further. Says that she has started an exercise  program. Is requesting some type of medicine to just help her get a "jump start on weight loss". says that she had labs to evaluate this weight gain with Dr. Tanya Nonespickard. I reviewed records and on 08/10/13 TSH was done and was normal. She has no hypertension and no cardiac history.   Past Medical History  Diagnosis Date  . Migraines      Home Meds: See attached medication section for current medication list. Any medications entered into computer today will not appear on this note's list. The medications listed below were entered prior to today. Current Outpatient Prescriptions on File Prior to Visit  Medication Sig Dispense Refill  . acetaminophen (TYLENOL) 500 MG tablet Take 1,000 mg by mouth every 6 (six) hours as needed. For pain       . ALPRAZolam (XANAX) 0.25 MG tablet TAKE 1 TABLET BY MOUTH 3 TIMES A DAY AS NEEDED  90 tablet  0  . methylphenidate (RITALIN LA) 10 MG 24 hr capsule Take 1 capsule (10 mg total) by mouth daily.  30 capsule  0  . pantoprazole (PROTONIX) 40 MG tablet Take 1 tablet (40 mg total) by mouth daily.  30 tablet  5   No current facility-administered medications on file prior to visit.    Allergies:  Allergies  Allergen Reactions  . Codeine Shortness Of Breath  . Penicillins Swelling  . Topamax [Topiramate]  Cognitive Slowing--types same word over and over. Cannot get out correct words when talking.  Colleen Can [Sumatriptan-Naproxen Sodium]     Throat gets tight.    History   Social History  . Marital Status: Married    Spouse Name: N/A    Number of Children: N/A  . Years of Education: N/A   Occupational History  . Not on file.   Social History Main Topics  . Smoking status: Former Games developer  . Smokeless tobacco: Not on file  . Alcohol Use: No  . Drug Use: No  . Sexual Activity: Yes    Birth Control/ Protection: Surgical     Comment: Married to [employer], two kids.  Retired from [employer].   Other Topics Concern  . Not on file   Social History  Narrative  . No narrative on file    Family History  Problem Relation Age of Onset  . COPD Father      Review of Systems:  See HPI for pertinent ROS. All other ROS negative.    Physical Exam: Blood pressure 104/70, pulse 88, temperature 97.3 F (36.3 C), temperature source Oral, resp. rate 18, height 5\' 5"  (1.651 m), weight 150 lb (68.04 kg)., Body mass index is 24.96 kg/(m^2). General: WNWD WF. Appears in no acute distress. Head: Normocephalic, atraumatic, eyes without discharge, sclera non-icteric, nares are without discharge. Bilateral auditory canals clear, TM's are without perforation, pearly grey and translucent with reflective cone of light bilaterally. Oral cavity moist, posterior pharynx without exudate, erythema, peritonsillar abscess. No sinus tenderness with percussion.  Neck: Supple. No thyromegaly. No lymphadenopathy. Lungs: Clear bilaterally to auscultation without wheezes, rales, or rhonchi. Breathing is unlabored. Heart: RRR with S1 S2. No murmurs, rubs, or gallops. Musculoskeletal:  Strength and tone normal for age. Extremities/Skin: Warm and dry. Neuro: Alert and oriented X 3. Moves all extremities spontaneously. Gait is normal. CNII-XII grossly in tact. Psych:  Responds to questions appropriately with a normal affect.     ASSESSMENT AND PLAN:  38 y.o. year old female with  1. Viral respiratory infection Results for orders placed in visit on 11/07/13  INFLUENZA A AND B      Result Value Range   Source-INFBD NASAL     Inflenza A Ag NEG  Negative   Influenza B Ag NEG  Negative     2. Fever, unspecified - Influenza a and b  3. Obesity, unspecified Discussed that this medication will suppress her appetite while she is on the medication. Discussed that the problem is, once she has stopped the medication, things are going to be back to where they are now. Therefore, while on the medication she must establish a good exercise and diet program that she can  continue lifelong. - phentermine 37.5 MG capsule; Take 1 capsule (37.5 mg total) by mouth every morning.  Dispense: 30 capsule; Refill: 0  4. adverse effects with Topamax: Agree for her to stop and stay off of the Topamax. I have added Topamax to her" allergy" list.  Signed, Frazier Richards, [place], [facility] 11/08/2013 7:21 AM

## 2013-12-04 ENCOUNTER — Other Ambulatory Visit: Payer: Self-pay | Admitting: Physician Assistant

## 2013-12-04 ENCOUNTER — Other Ambulatory Visit: Payer: Self-pay | Admitting: Family Medicine

## 2013-12-06 MED ORDER — ALPRAZOLAM 0.25 MG PO TABS: ORAL_TABLET | ORAL | Status: DC

## 2013-12-06 NOTE — Telephone Encounter (Signed)
Med called to pharm 

## 2013-12-08 ENCOUNTER — Other Ambulatory Visit: Payer: Self-pay | Admitting: Physician Assistant

## 2013-12-11 ENCOUNTER — Other Ambulatory Visit: Payer: Self-pay | Admitting: Family Medicine

## 2013-12-11 DIAGNOSIS — E669 Obesity, unspecified: Secondary | ICD-10-CM

## 2013-12-11 MED ORDER — PHENTERMINE HCL 37.5 MG PO CAPS: 37.5000 mg | ORAL_CAPSULE | ORAL | Status: DC

## 2014-06-12 ENCOUNTER — Telehealth: Payer: Self-pay | Admitting: Family Medicine

## 2014-06-12 NOTE — Telephone Encounter (Signed)
Patient has cpe scheduled with dr Tanya Nones in November would like referral for mammogram to the [facility] of [place]  [phone]

## 2014-06-13 ENCOUNTER — Other Ambulatory Visit: Payer: Self-pay | Admitting: Family Medicine

## 2014-06-13 DIAGNOSIS — Z1231 Encounter for screening mammogram for malignant neoplasm of breast: Secondary | ICD-10-CM

## 2014-06-13 NOTE — Telephone Encounter (Signed)
Order place for mammogram and letter sent to pt to call and schedule appt

## 2014-06-14 ENCOUNTER — Other Ambulatory Visit: Payer: Self-pay | Admitting: Physician Assistant

## 2014-06-19 ENCOUNTER — Emergency Department (HOSPITAL_BASED_OUTPATIENT_CLINIC_OR_DEPARTMENT_OTHER)
Admission: EM | Admit: 2014-06-19 | Discharge: 2014-06-20 | Disposition: A | Discharge status: Home / Self Care | Attending: Emergency Medicine | Admitting: Emergency Medicine

## 2014-06-19 ENCOUNTER — Encounter (HOSPITAL_BASED_OUTPATIENT_CLINIC_OR_DEPARTMENT_OTHER): Payer: Self-pay | Admitting: Emergency Medicine

## 2014-06-19 ENCOUNTER — Emergency Department (HOSPITAL_BASED_OUTPATIENT_CLINIC_OR_DEPARTMENT_OTHER)

## 2014-06-19 DIAGNOSIS — R29810 Facial weakness: Secondary | ICD-10-CM | POA: Insufficient documentation

## 2014-06-19 DIAGNOSIS — G510 Bell's palsy: Secondary | ICD-10-CM | POA: Insufficient documentation

## 2014-06-19 LAB — COMPREHENSIVE METABOLIC PANEL
ALT: 18 U/L (ref 0–35)
ANION GAP: 13 (ref 5–15)
AST: 17 U/L (ref 0–37)
Albumin: 4.4 g/dL (ref 3.5–5.2)
Alkaline Phosphatase: 61 U/L (ref 39–117)
BILIRUBIN TOTAL: 0.4 mg/dL (ref 0.3–1.2)
BUN: 13 mg/dL (ref 6–23)
CO2: 27 meq/L (ref 19–32)
CREATININE: 0.8 mg/dL (ref 0.50–1.10)
Calcium: 9.9 mg/dL (ref 8.4–10.5)
Chloride: 100 mEq/L (ref 96–112)
GFR calc non Af Amer: >90 mL/min (ref >90)
Glucose, Bld: 147 mg/dL — ABNORMAL HIGH (ref 70–99)
Potassium: 3.7 mEq/L (ref 3.7–5.3)
Sodium: 140 mEq/L (ref 137–147)
Total Protein: 8.1 g/dL (ref 6.0–8.3)

## 2014-06-19 LAB — PROTIME-INR
INR: 0.96 (ref 0.00–1.49)
Prothrombin Time: 12.8 seconds (ref 11.6–15.2)

## 2014-06-19 LAB — CBC
HEMATOCRIT: 39.3 % (ref 36.0–46.0)
HEMOGLOBIN: 13.3 g/dL (ref 12.0–15.0)
MCH: 29.6 pg (ref 26.0–34.0)
MCHC: 33.8 g/dL (ref 30.0–36.0)
MCV: 87.5 fL (ref 78.0–100.0)
Platelets: 302 10*3/uL (ref 150–400)
RBC: 4.49 MIL/uL (ref 3.87–5.11)
RDW: 13.2 % (ref 11.5–15.5)
WBC: 8 10*3/uL (ref 4.0–10.5)

## 2014-06-19 LAB — URINALYSIS, ROUTINE W REFLEX MICROSCOPIC
Bilirubin Urine: NEGATIVE
Glucose, UA: NEGATIVE mg/dL
Ketones, ur: NEGATIVE mg/dL
Leukocytes, UA: NEGATIVE
Nitrite: NEGATIVE
PH: 6 (ref 5.0–8.0)
Protein, ur: NEGATIVE mg/dL
SPECIFIC GRAVITY, URINE: 1.018 (ref 1.005–1.030)
UROBILINOGEN UA: 0.2 mg/dL (ref 0.0–1.0)

## 2014-06-19 LAB — DIFFERENTIAL
Basophils Absolute: 0 10*3/uL (ref 0.0–0.1)
Basophils Relative: 0 % (ref 0–1)
EOS PCT: 0 % (ref 0–5)
Eosinophils Absolute: 0 10*3/uL (ref 0.0–0.7)
LYMPHS PCT: 28 % (ref 12–46)
Lymphs Abs: 2.3 10*3/uL (ref 0.7–4.0)
MONO ABS: 0.5 10*3/uL (ref 0.1–1.0)
MONOS PCT: 7 % (ref 3–12)
NEUTROS ABS: 5.2 10*3/uL (ref 1.7–7.7)
Neutrophils Relative %: 65 % (ref 43–77)

## 2014-06-19 LAB — RAPID URINE DRUG SCREEN, HOSP PERFORMED
Amphetamines: NOT DETECTED
Barbiturates: NOT DETECTED
Benzodiazepines: NOT DETECTED
Cocaine: NOT DETECTED
Opiates: NOT DETECTED
Tetrahydrocannabinol: NOT DETECTED

## 2014-06-19 LAB — APTT: APTT: 27 s (ref 24–37)

## 2014-06-19 LAB — URINE MICROSCOPIC-ADD ON

## 2014-06-19 LAB — TROPONIN I

## 2014-06-19 LAB — ETHANOL: Alcohol, Ethyl (B): <11 mg/dL (ref 0–11)

## 2014-06-19 MED ORDER — DIPHENHYDRAMINE HCL 50 MG/ML IJ SOLN
25.0000 mg | Freq: Once | INTRAMUSCULAR | Status: AC
  Administered 2014-06-19: 25 mg via INTRAVENOUS
  Filled 2014-06-19: qty 1

## 2014-06-19 MED ORDER — SODIUM CHLORIDE 0.9 % IV BOLUS (SEPSIS)
1000.0000 mL | Freq: Once | INTRAVENOUS | Status: AC
  Administered 2014-06-19: 1000 mL via INTRAVENOUS

## 2014-06-19 MED ORDER — METOCLOPRAMIDE HCL 5 MG/ML IJ SOLN
10.0000 mg | Freq: Once | INTRAMUSCULAR | Status: AC
  Administered 2014-06-19: 10 mg via INTRAVENOUS
  Filled 2014-06-19: qty 2

## 2014-06-19 NOTE — ED Provider Notes (Signed)
CSN: [id number]     Arrival date & time 06/19/14  2007 History  This chart was scribed for Tracey Octave, MD by Luisa Dago, ED Scribe. This patient was seen in room MH09/MH09 and the patient's care was started at 8:39 PM.  Chief Complaint  Patient presents with  . Facial Droop   The history is provided by the patient. No language interpreter was used.   HPI Comments: Tracey Malone is a 38 y.o. female who presents to the Emergency Department complaining of left sided facial droop that started 2 days PTA. Pt states that she was taking a picture in the car when she noticed that her left side of her face was drooping. She does endorse a frontal headache 3 days ago, but it resolved after taking Tylenol. Pt states that the headache she had on Monday felt different, because it woke her up from her sleep. Spouse states pt called her a yesterday saying that she had forgotten her house number. He also thinks that pt is slurring her speech. Pt does not think that her speech has changed. Tracey Malone endorses tingling to the right side of her face. She denies any chest pain, abdominal pain, hx of HTN, hx of DM, dizziness, light-headedness. Denies being in any pain. The last time she was known normal was 2 days ago.  PCP: Leo Grosser, MD   Past Medical History  Diagnosis Date  . Migraines    Past Surgical History  Procedure Laterality Date  . Incontinence surgery    . Tubal ligation    . Hemorrhoid surgery     Family History  Problem Relation Age of Onset  . COPD Father    History  Substance Use Topics  . Smoking status: Never Smoker   . Smokeless tobacco: Not on file  . Alcohol Use: No   OB History   Grav Para Term Preterm Abortions TAB SAB Ect Mult Living                 Review of Systems A complete 10 system review of systems was obtained and all systems are negative except as noted in the HPI and PMH.    Allergies  Codeine; Topamax; Treximet; and Penicillins  Home  Medications   Prior to Admission medications   Medication Sig Start Date End Date Taking? Authorizing Provider  acetaminophen (TYLENOL) 500 MG tablet Take 1,000 mg by mouth every 6 (six) hours as needed. For pain    Yes Historical Provider, MD  phentermine 37.5 MG capsule Take 1 capsule (37.5 mg total) by mouth every morning. 12/11/13  Yes Dorena Bodo, PA-C   Triage Vitals:BP 120/73  Pulse 103  Temp(Src) 98.2 F (36.8 C) (Oral)  Resp 18  Ht  (1.626 m)  Wt 130 lb (58.968 kg)  BMI 22.30 kg/m2  SpO2 100%  LMP 06/02/2014  Physical Exam  Nursing note and vitals reviewed. Constitutional: She is oriented to person, place, and time. She appears well-developed and well-nourished. No distress.  HENT:  Head: Normocephalic and atraumatic.  Mouth/Throat: Oropharynx is clear and moist. No oropharyngeal exudate.  Eyes: Conjunctivae and EOM are normal. Pupils are equal, round, and reactive to light.  Neck: Normal range of motion. Neck supple.  No meningismus.  Cardiovascular: Normal rate, regular rhythm, normal heart sounds and intact distal pulses.   No murmur heard. Pulmonary/Chest: Effort normal and breath sounds normal. No respiratory distress.  Abdominal: Soft. There is no tenderness. There is no rebound and no  guarding.  Musculoskeletal: Normal range of motion. She exhibits no edema and no tenderness.  Neurological: She is alert and oriented to person, place, and time. No cranial nerve deficit. She exhibits normal muscle tone. Coordination normal.  Equal grip strength. Sensation intact. Flattening of frontalis muscle on the left, but still able to wrinkle forehead somewhat. Symmetrical eyebrow raise. Asymetric smile with nasolabial folding flattening on the left. Equal strength throughout. No ataxia on finger to nose.   Skin: Skin is warm.  Psychiatric: She has a normal mood and affect. Her behavior is normal.    ED Course  Procedures (including critical care time)  DIAGNOSTIC  STUDIES: Oxygen Saturation is 100% on RA, normal by my interpretation.    COORDINATION OF CARE: 8:47 PM- Will order a head CT. Pt advised of plan for treatment and pt agrees.  Labs Review Labs Reviewed  COMPREHENSIVE METABOLIC PANEL - Abnormal; Notable for the following:    Glucose, Bld 147 (*)    All other components within normal limits  URINALYSIS, ROUTINE W REFLEX MICROSCOPIC - Abnormal; Notable for the following:    APPearance CLOUDY (*)    Hgb urine dipstick SMALL (*)    All other components within normal limits  URINE MICROSCOPIC-ADD ON - Abnormal; Notable for the following:    Squamous Epithelial / LPF FEW (*)    All other components within normal limits  ETHANOL  PROTIME-INR  APTT  CBC  DIFFERENTIAL  URINE RAPID DRUG SCREEN (HOSP PERFORMED)  TROPONIN I    Imaging Review Ct Head Wo Contrast  06/19/2014   CLINICAL DATA:  Facial droop  EXAM: CT HEAD WITHOUT CONTRAST  TECHNIQUE: Contiguous axial images were obtained from the base of the skull through the vertex without intravenous contrast.  COMPARISON:  Prior CT from 09/11/2006  FINDINGS: There is no acute intracranial hemorrhage or infarct. No mass lesion or midline shift. Gray-white matter differentiation is well maintained. Ventricles are normal in size without evidence of hydrocephalus. CSF containing spaces are within normal limits. No extra-axial fluid collection.  The calvarium is intact.  Orbital soft tissues are within normal limits.  The paranasal sinuses and mastoid air cells are well pneumatized and free of fluid.  Scalp soft tissues are unremarkable.  IMPRESSION: Normal head CT   Electronically Signed   By: Rise Mu M.D.   On: 06/19/2014 21:15   9:23 PM-Pt was rechecked. Head CT normal. She will transferred to [facility] for an MRI.  MDM   Final diagnoses:  Facial droop   Patient presents with left-sided facial droop, difficulty speaking last seen normal 2 days ago. Says she had a headache 2 days  ago which has since resolved. She took a picture of her self today noticed her face was drooping today. Her husband returned home after being out of town and found her to have a facial droop with difficulty speaking. No weakness in her arms or legs.  Code stroke not activated given delayed presentation. CT head is negative.  Labs are unremarkable.  Suspect Bell's palsy versus complex migraine. No headache at this time. Doubt CVA. Patient has some motor function of her left forehead so Bell's palsy appears to be incomplete. Will need MRI to rule out CVA.  Discussed with Dr. Radford Pax who accepts patient for MRI [facility].   Date: 06/19/2014  Rate: 107  Rhythm: sinus tachycardia  QRS Axis: normal  Intervals: normal  ST/T Wave abnormalities: normal  Conduction Disutrbances:none  Narrative Interpretation:   Old  EKG Reviewed: none available    I personally performed the services described in this documentation, which was scribed in my presence. The recorded information has been reviewed and is accurate.    Tracey Octave, MD 06/19/14 [id number]

## 2014-06-19 NOTE — ED Notes (Signed)
Pt transferred from [facility] for slurred speech, right side facial droop. CT negative, here to neuro consult and MRI. A&OX4. BP-122/71, HR-102, RR-18. NIH-2, passed swallow screen.

## 2014-06-19 NOTE — ED Provider Notes (Signed)
Pt arrives at this receiving facility, from one [place] ED to another [facility], as a transfer due to service availability and/or Pt request.  Pt seen at [facility] by Dr Lajean Saver.  Pt facial droop starting Monday, HA waking from sleep early Tuesday morning.  No recent viral illness or tick bite.  Husband returned from deployment today, noted change in speech.  Was transferred for MRI due to incomplete nature of facial droop, presumed incomplete Bell's Palsy.  MRI not available at this time unless emergent.  Consult to neurology, Dr Amada Jupiter, who will see patient.  Olivia Mackie, MD 06/20/14 [phone]

## 2014-06-19 NOTE — ED Notes (Signed)
Patient transported to CT 

## 2014-06-19 NOTE — ED Notes (Signed)
Pt monitored by pulse ox, bp cuff, and 5-lead. 

## 2014-06-19 NOTE — ED Notes (Addendum)
C/o rt facial numbness with rt facial droop. States h/a awaken her on Tuesday during the night from a sound sleep.C/o rt sided h/a 3/10.  No vision problems and no other neuro deficits. Husband states he feels her speech is slurred.

## 2014-06-19 NOTE — ED Notes (Signed)
C/o ha onset Monday,  Denies ha now  Had onset of facial droop rt side Tuesday am  Grips equal bilateral

## 2014-06-19 NOTE — ED Notes (Signed)
Admitting MD at bedside.

## 2014-06-20 DIAGNOSIS — R29810 Facial weakness: Secondary | ICD-10-CM

## 2014-06-20 MED ORDER — PREDNISONE 5 MG PO TABS: 10.0000 mg | ORAL_TABLET | Freq: Every day | ORAL | Status: DC

## 2014-06-20 MED ORDER — PREDNISONE 20 MG PO TABS
60.0000 mg | ORAL_TABLET | Freq: Once | ORAL | Status: AC
  Administered 2014-06-20: 60 mg via ORAL
  Filled 2014-06-20: qty 3

## 2014-06-20 NOTE — Discharge Instructions (Signed)
Please take prednisone as prescribed.  Follow up with neurology.   Bell's Palsy Bell's palsy is a condition in which the muscles on one side of the face cannot move (paralysis). This is because the nerves in the face are paralyzed. It is most often thought to be caused by a virus. The virus causes swelling of the nerve that controls movement on one side of the face. The nerve travels through a tight space surrounded by bone. When the nerve swells, it can be compressed by the bone. This results in damage to the protective covering around the nerve. This damage interferes with how the nerve communicates with the muscles of the face. As a result, it can cause weakness or paralysis of the facial muscles.  Injury (trauma), tumor, and surgery may cause Bell's palsy, but most of the time the cause is unknown. It is a relatively common condition. It starts suddenly (abrupt onset) with the paralysis usually ending within 2 days. Bell's palsy is not dangerous. But because the eye does not close properly, you may need care to keep the eye from getting dry. This can include splinting (to keep the eye shut) or moistening with artificial tears. Bell's palsy very seldom occurs on both sides of the face at the same time. SYMPTOMS   Eyebrow sagging.  Drooping of the eyelid and corner of the mouth.  Inability to close one eye.  Loss of taste on the front of the tongue.  Sensitivity to loud noises. TREATMENT  The treatment is usually non-surgical. If the patient is seen within the first 24 to 48 hours, a short course of steroids may be prescribed, in an attempt to shorten the length of the condition. Antiviral medicines may also be used with the steroids, but it is unclear if they are helpful.  You will need to protect your eye, if you cannot close it. The cornea (clear covering over your eye) will become dry and can be damaged. Artificial tears can be used to keep your eye moist. Glasses or an eye patch should be  worn to protect your eye. PROGNOSIS  Recovery is variable, ranging from days to months. Although the problem usually goes away completely (about 80% of cases resolve), predicting the outcome is impossible. Most people improve within 3 weeks of when the symptoms began. Improvement may continue for 3 to 6 months. A small number of people have moderate to severe weakness that is permanent.  HOME CARE INSTRUCTIONS   If your caregiver prescribed medication to reduce swelling in the nerve, use as directed. Do not stop taking the medication unless directed by your caregiver.  Use moisturizing eye drops as needed to prevent drying of your eye, as directed by your caregiver.  Protect your eye, as directed by your caregiver.  Use facial massage and exercises, as directed by your caregiver.  Perform your normal activities, and get your normal rest. SEEK IMMEDIATE MEDICAL CARE IF:   There is pain, redness or irritation in the eye.  You or your child has an oral temperature above 102 F (38.9 C), not controlled by medicine. MAKE SURE YOU:   Understand these instructions.  Will watch your condition.  Will get help right away if you are not doing well or get worse. Document Released: 09/20/2005 Document Revised: 12/13/2011 Document Reviewed: 12/28/2013 [facility] Patient Information 2015 [place], [place]. This information is not intended to replace advice given to you by your health care provider. Make sure you discuss any questions you have with your  health care provider. ° °

## 2014-06-20 NOTE — Consult Note (Signed)
Neurology Consultation Reason for Consult: Facial weakness Referring Physician: Jenean Lindau  CC: Facial weakness  History is obtained from: Patient, husband  HPI: Tracey Malone is a 38 y.o. female with facial weakness progressively worsening over the past several days. Her husband states that he first noticed she was moving her mouth slightly differently this past weekend. She states that she noticed when she took a selfie he today that her left side of her face was not reacting normally. She denies numbness, weakness, difficulty walking. She has had a little bit of slurred speech today.  Of note, she has been under a great deal of stress  LKW: Last weekend tpa given?: no, not stroke, outside of window   ROS: A 14 point ROS was performed and is negative except as noted in the HPI.   Past Medical History  Diagnosis Date  . Migraines     Family History: Denies history of neurological disease  Social History: Tob: Denies  Exam: Current vital signs: BP 107/62  Pulse 84  Temp(Src) 98.6 F (37 C) (Oral)  Resp 17  Ht  (1.626 m)  Wt 58.968 kg (130 lb)  BMI 22.30 kg/m2  SpO2 98%  LMP 06/02/2014 Vital signs in last 24 hours: Temp:  [98.2 F (36.8 C)-98.6 F (37 C)] 98.6 F (37 C) (09/16 2151) Pulse Rate:  [84-103] 84 (09/16 2330) Resp:  [17-22] 17 (09/16 2330) BP: (100-120)/(53-73) 107/62 mmHg (09/16 2330) SpO2:  [98 %-100 %] 98 % (09/16 2330) Weight:  [58.968 kg (130 lb)] 58.968 kg (130 lb) (09/16 2022)  General: In bed, NAD CV: Regular rate and rhythm Mental Status: Patient is awake, alert, oriented to person, place, month, year, and situation. Immediate and remote memory are intact. Patient is able to give a clear and coherent history. No signs of aphasia or neglect She has a mild dysarthria that improves with elevation of her left angle of her mouth Cranial Nerves: II: Visual Fields are full. Pupils are equal, round, and reactive to light.  Discs are  sharp. III,IV, VI: EOMI without ptosis or diploplia.  V: Facial sensation is symmetric to pinprick VII: Facial movement is notable for left facial weakness including some involvement of the forehead with a notable diminishment in forehead wrinkling. VIII: hearing is intact to voice X: Uvula elevates symmetrically XI: Shoulder shrug is symmetric. XII: tongue is midline without atrophy or fasciculations.  Motor: Tone is normal. Bulk is normal. 5/5 strength was present in all four extremities.  Sensory: Sensation is symmetric to light touch and temperature in the arms and legs. Deep Tendon Reflexes: 2+ and symmetric in the biceps and patellae.  Plantars: Toes are downgoing bilaterally.  Cerebellar: FNF and HKS are intact bilaterally Gait: Able to tandem     I have reviewed labs in epic and the results pertinent to this consultation are: CMP-unremarkable  I have reviewed the images obtained: CT head-no acute findings  Impression: 38 year old female with subacute progressive left facial weakness involving before head. 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 Unless she were to  have worsening beyond 3 weeks or develop other symptoms, I don't feel that further investigation is needed at this time and would therefore treat for Bell's palsy.  Recommendations: 1) prednisone 60 mg per day for 5 days then 50 for one day and 40 for one day then 30 for one day and 20 for one day then 10  for one day then stop for a total of a 10 day course 2) followup as outpatient with neurology.   Ritta Slot, MD [facility] [phone]  If 7pm- 7am, please page neurology on call as listed in [facility].

## 2014-07-01 ENCOUNTER — Inpatient Hospital Stay: Admitting: Family Medicine

## 2014-07-01 ENCOUNTER — Ambulatory Visit
Admission: RE | Admit: 2014-07-01 | Discharge: 2014-07-01 | Disposition: A | Discharge status: Home / Self Care | Source: Ambulatory Visit | Attending: Family Medicine | Admitting: Family Medicine

## 2014-07-01 DIAGNOSIS — Z1231 Encounter for screening mammogram for malignant neoplasm of breast: Secondary | ICD-10-CM

## 2014-07-02 ENCOUNTER — Ambulatory Visit (INDEPENDENT_AMBULATORY_CARE_PROVIDER_SITE_OTHER): Admitting: Family Medicine

## 2014-07-02 ENCOUNTER — Encounter: Payer: Self-pay | Admitting: Family Medicine

## 2014-07-02 VITALS — BP 100/80 | HR 76 | Temp 98.9°F | Resp 14 | Ht 65.0 in | Wt 136.0 lb

## 2014-07-02 DIAGNOSIS — G510 Bell's palsy: Secondary | ICD-10-CM

## 2014-07-02 DIAGNOSIS — Z09 Encounter for follow-up examination after completed treatment for conditions other than malignant neoplasm: Secondary | ICD-10-CM

## 2014-07-02 NOTE — Progress Notes (Signed)
Subjective:    Patient ID: Tracey Malone, female    DOB: 02/16/1976, 38 y.o.   MRN: [id number]  HPI  Patient was seen in ER 06/20/14.  I have copied the Neurologist consult note below for my reference: CC: Facial weakness  History is obtained from: Patient, husband  HPI: Tracey Malone is a 38 y.o. female with facial weakness progressively worsening over the past several days. Her husband states that he first noticed she was moving her mouth slightly differently this past weekend. She states that she noticed when she took a selfie he today that her left side of her face was not reacting normally. She denies numbness, weakness, difficulty walking. She has had a little bit of slurred speech today.  Of note, she has been under a great deal of stress  LKW: Last weekend  tpa given?: no, not stroke, outside of window  ROS: A 14 point ROS was performed and is negative except as noted in the HPI.  Past Medical History   Diagnosis  Date   .  Migraines    Family History:  Denies history of neurological disease  Social History:  Tob: Denies  Exam:  Current vital signs:  BP 107/62  Pulse 84  Temp(Src) 98.6 F (37 C) (Oral)  Resp 17  Ht 5\' 4"  (1.626 m)  Wt 58.968 kg (130 lb)  BMI 22.30 kg/m2  SpO2 98%  LMP 06/02/2014  Vital signs in last 24 hours:  Temp: [98.2 F (36.8 C)-98.6 F (37 C)] 98.6 F (37 C) (09/16 2151)  Pulse Rate: [84-103] 84 (09/16 2330)  Resp: [17-22] 17 (09/16 2330)  BP: (100-120)/(53-73) 107/62 mmHg (09/16 2330)  SpO2: [98 %-100 %] 98 % (09/16 2330)  Weight: [58.968 kg (130 lb)] 58.968 kg (130 lb) (09/16 2022)  General: In bed, NAD  CV: Regular rate and rhythm  Mental Status:  Patient is awake, alert, oriented to person, place, month, year, and situation.  Immediate and remote memory are intact.  Patient is able to give a clear and coherent history.  No signs of aphasia or neglect  She has a mild dysarthria that improves with elevation of her left angle of  her mouth  Cranial Nerves:  II: Visual Fields are full. Pupils are equal, round, and reactive to light. Discs are sharp.  III,IV, VI: EOMI without ptosis or diploplia.  V: Facial sensation is symmetric to pinprick  VII: Facial movement is notable for left facial weakness including some involvement of the forehead with a notable diminishment in forehead wrinkling.  VIII: hearing is intact to voice  X: Uvula elevates symmetrically  XI: Shoulder shrug is symmetric.  XII: tongue is midline without atrophy or fasciculations.  Motor:  Tone is normal. Bulk is normal. 5/5 strength was present in all four extremities.  Sensory:  Sensation is symmetric to light touch and temperature in the arms and legs.  Deep Tendon Reflexes:  2+ and symmetric in the biceps and patellae.  Plantars:  Toes are downgoing bilaterally.  Cerebellar:  FNF and HKS are intact bilaterally  Gait:  Able to tandem  I have reviewed labs in epic and the results pertinent to this consultation are:  CMP-unremarkable  I have reviewed the images obtained: CT head-no acute findings  Impression: 38 year old female with subacute progressive left facial weakness involving before head. 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 Unless she were to have worsening  beyond 3 weeks or develop other symptoms, I don't feel that further investigation is needed at this time and would therefore treat for Bell's palsy.  Recommendations:  1) prednisone 60 mg per day for 5 days then 50 for one day and 40 for one day then 30 for one day and 20 for one day then 10 for one day then stop for a total of a 10 day course  2) followup as outpatient with neurology.  Ritta SlotMcNeill Kirkpatrick, MD  [facility]  She is here today for follow up.  I have reviewed all of her laboratory studies, her head CT, and her hospital notes. Patient states that her symptoms began  approximately 12 days ago. It was a gradual onset. The patient noticed weakness in her orehead muscles as well as weakness in her cheek muscles. She was unable to smile. The wound this was limited to the left side of her face. She denied any numbness or tingling in the face. She denied any postauricular pain. She denied any recent cold symptoms or viral infections. She denied any recent tick bites, erythema migrans, or unexplained rashes. She went to the emergency room and workup was negative for acute stroke and she was diagnosed with Bell's palsy. She completed a ten-day course of prednisone. The weakness in her face is starting to improve. She still has some mild weakness in the muscles over her left eye. She also has some slight weakness in her facial muscles particularly noticeable when she tries to smile. When her faces at rest there is no obvious asymmetry. She denies any other neurologic deficits. She denies any headache, fevers, neck stiffness, or rashes. Past Medical History  Diagnosis Date  . Migraines    Past Surgical History  Procedure Laterality Date  . Incontinence surgery    . Tubal ligation    . Hemorrhoid surgery     No current outpatient prescriptions on file prior to visit.   No current facility-administered medications on file prior to visit.   Allergies  Allergen Reactions  . Codeine Shortness Of Breath  . Topamax [Topiramate]     Cognitive Slowing--types same word over and over. Cannot get out correct words when talking.  Colleen Can. Treximet [Sumatriptan-Naproxen Sodium] Shortness Of Breath and Swelling    Throat gets tight.  . Penicillins Swelling   History   Social History  . Marital Status: Married    Spouse Name: N/A    Number of Children: N/A  . Years of Education: N/A   Occupational History  . Not on file.   Social History Main Topics  . Smoking status: Never Smoker   . Smokeless tobacco: Not on file  . Alcohol Use: No  . Drug Use: No  . Sexual Activity:  Yes    Birth Control/ Protection: Surgical     Comment: Married to Tracey Malone, two kids.  Retired from [employer].   Other Topics Concern  . Not on file   Social History Narrative  . No narrative on file   Family History  Problem Relation Age of Onset  . COPD Father       Review of Systems  All other systems reviewed and are negative.      Objective:   Physical Exam  Vitals reviewed. Constitutional: She is oriented to person, place, and time. She appears well-developed and well-nourished. No distress.  HENT:  Head: Normocephalic and atraumatic.  Right Ear: External ear normal.  Left Ear: External ear normal.  Nose: Nose normal.  Mouth/Throat:  Oropharynx is clear and moist. No oropharyngeal exudate.  Eyes: Conjunctivae and EOM are normal. Pupils are equal, round, and reactive to light. Right eye exhibits no discharge. Left eye exhibits no discharge. No scleral icterus.  Neck: Normal range of motion. Neck supple. No JVD present. No tracheal deviation present. No thyromegaly present.  Cardiovascular: Normal rate, regular rhythm, normal heart sounds and intact distal pulses.   No murmur heard. Pulmonary/Chest: Effort normal and breath sounds normal. No respiratory distress. She has no wheezes. She has no rales. She exhibits no tenderness.  Lymphadenopathy:    She has no cervical adenopathy.  Neurological: She is alert and oriented to person, place, and time. She has normal strength and normal reflexes. A cranial nerve deficit is present. No sensory deficit. She displays a negative Romberg sign. Coordination and gait normal.  Patient has a left-sided palsy in the seventh cranial nerve  Skin: She is not diaphoretic.  Psychiatric: She has a normal mood and affect. Her behavior is normal. Judgment and thought content normal.          Assessment & Plan:  Hospital discharge follow-up  Bell's palsy   Based on the patient's history, physical exam, and evaluation in the emergency  room, the patient has Barrett's palsy. I see no evidence of a recent herpetiform infection or Lyme disease. Therefore I do not believe the addition of acyclovir or doxycycline would be helpful. She's completed a ten-day course of prednisone. She is already beginning to improve. I explained to the patient that a 90% of patients improving 6 weeks to 3 months. I anticipate gradual spontaneous resolution of her symptoms over the next several weeks. The patient is instructed to followup immediately if she develops new neurologic deficits or if her symptoms worsen.

## 2014-08-12 ENCOUNTER — Encounter: Payer: Self-pay | Admitting: Family Medicine

## 2014-08-12 ENCOUNTER — Ambulatory Visit (INDEPENDENT_AMBULATORY_CARE_PROVIDER_SITE_OTHER): Admitting: Family Medicine

## 2014-08-12 VITALS — BP 98/62 | HR 72 | Temp 98.4°F | Resp 14 | Ht 65.0 in | Wt 134.0 lb

## 2014-08-12 DIAGNOSIS — Z0000 Encounter for general adult medical examination without abnormal findings: Secondary | ICD-10-CM

## 2014-08-12 DIAGNOSIS — G510 Bell's palsy: Secondary | ICD-10-CM | POA: Insufficient documentation

## 2014-08-12 DIAGNOSIS — Z23 Encounter for immunization: Secondary | ICD-10-CM

## 2014-08-12 LAB — COMPLETE METABOLIC PANEL WITH GFR
ALK PHOS: 49 U/L (ref 39–117)
ALT: 20 U/L (ref 0–35)
AST: 19 U/L (ref 0–37)
Albumin: 4.5 g/dL (ref 3.5–5.2)
BILIRUBIN TOTAL: 0.5 mg/dL (ref 0.2–1.2)
BUN: 16 mg/dL (ref 6–23)
CO2: 27 mEq/L (ref 19–32)
Calcium: 9.2 mg/dL (ref 8.4–10.5)
Chloride: 102 mEq/L (ref 96–112)
Creat: 0.78 mg/dL (ref 0.50–1.10)
GFR, Est African American: >89 mL/min
GLUCOSE: 77 mg/dL (ref 70–99)
Potassium: 4.8 mEq/L (ref 3.5–5.3)
Sodium: 137 mEq/L (ref 135–145)
Total Protein: 6.9 g/dL (ref 6.0–8.3)

## 2014-08-12 LAB — CBC WITH DIFFERENTIAL/PLATELET
Basophils Absolute: 0 10*3/uL (ref 0.0–0.1)
Basophils Relative: 0 % (ref 0–1)
EOS PCT: 1 % (ref 0–5)
Eosinophils Absolute: 0.1 10*3/uL (ref 0.0–0.7)
HCT: 38.8 % (ref 36.0–46.0)
Hemoglobin: 13.2 g/dL (ref 12.0–15.0)
LYMPHS PCT: 29 % (ref 12–46)
Lymphs Abs: 1.7 10*3/uL (ref 0.7–4.0)
MCH: 29.8 pg (ref 26.0–34.0)
MCHC: 34 g/dL (ref 30.0–36.0)
MCV: 87.6 fL (ref 78.0–100.0)
Monocytes Absolute: 0.3 10*3/uL (ref 0.1–1.0)
Monocytes Relative: 6 % (ref 3–12)
NEUTROS ABS: 3.6 10*3/uL (ref 1.7–7.7)
Neutrophils Relative %: 64 % (ref 43–77)
PLATELETS: 299 10*3/uL (ref 150–400)
RBC: 4.43 MIL/uL (ref 3.87–5.11)
RDW: 13.7 % (ref 11.5–15.5)
WBC: 5.7 10*3/uL (ref 4.0–10.5)

## 2014-08-12 LAB — LIPID PANEL
Cholesterol: 179 mg/dL (ref 0–200)
HDL: 74 mg/dL (ref >39)
LDL Cholesterol: 94 mg/dL (ref 0–99)
Total CHOL/HDL Ratio: 2.4 Ratio
Triglycerides: 56 mg/dL (ref <150)
VLDL: 11 mg/dL (ref 0–40)

## 2014-08-12 LAB — TSH: TSH: 1.337 u[IU]/mL (ref 0.350–4.500)

## 2014-08-12 MED ORDER — CYCLOBENZAPRINE HCL 5 MG PO TABS: 5.0000 mg | ORAL_TABLET | Freq: Three times a day (TID) | ORAL | Status: DC | PRN

## 2014-08-12 NOTE — Addendum Note (Signed)
Addended by: Legrand RamsWILLIS, SANDY B on: 08/12/2014 12:34 PM   Modules accepted: Orders

## 2014-08-12 NOTE — Progress Notes (Signed)
Subjective:    Patient ID: Tracey Malone, female    DOB: [birth year], 38 y.o.   MRN: [id number]  HPI Patient is a very pleasant 38 yo wf here for cpe.  Patient had Bell's palsy affecting the left side of her face several months ago. The strength is pretty much returned to her face although she will occasionally have muscle spasms in the contralateral right side particularly at night when she is tired. She is requesting a muscle relaxer that she can take on an as-needed basis. Otherwise she is doing well. Her mammogram was performed in September and is normal. She is due for a Pap smear as well as her flu shot. Past Medical History  Diagnosis Date  . Migraines   . Bell's palsy    Past Surgical History  Procedure Laterality Date  . Incontinence surgery    . Tubal ligation    . Hemorrhoid surgery     No current outpatient prescriptions on file prior to visit.   No current facility-administered medications on file prior to visit.   Allergies  Allergen Reactions  . Codeine Shortness Of Breath  . Topamax [Topiramate]     Cognitive Slowing--types same word over and over. Cannot get out correct words when talking.  Colleen Can. Treximet [Sumatriptan-Naproxen Sodium] Shortness Of Breath and Swelling    Throat gets tight.  . Penicillins Swelling   History   Social History  . Marital Status: Married    Spouse Name: N/A    Number of Children: N/A  . Years of Education: N/A   Occupational History  . Not on file.   Social History Main Topics  . Smoking status: Never Smoker   . Smokeless tobacco: Not on file  . Alcohol Use: No  . Drug Use: No  . Sexual Activity: Yes    Birth Control/ Protection: Surgical     Comment: Married to [employer] Corporationim, two kids.  Retired from [employer].   Other Topics Concern  . Not on file   Social History Narrative   Family History  Problem Relation Age of Onset  . COPD Father       Review of Systems  All other systems reviewed and are negative.        Objective:   Physical Exam  Constitutional: She is oriented to person, place, and time. She appears well-developed and well-nourished. No distress.  HENT:  Head: Normocephalic and atraumatic.  Right Ear: External ear normal.  Left Ear: External ear normal.  Nose: Nose normal.  Mouth/Throat: Oropharynx is clear and moist. No oropharyngeal exudate.  Eyes: Conjunctivae and EOM are normal. Pupils are equal, round, and reactive to light. Right eye exhibits no discharge. Left eye exhibits no discharge. No scleral icterus.  Neck: Normal range of motion. Neck supple. No JVD present. No tracheal deviation present. No thyromegaly present.  Cardiovascular: Normal rate, regular rhythm, normal heart sounds and intact distal pulses.  Exam reveals no gallop and no friction rub.   No murmur heard. Pulmonary/Chest: Effort normal and breath sounds normal. No respiratory distress. She has no wheezes. She has no rales. She exhibits no tenderness.  Abdominal: Soft. Bowel sounds are normal. She exhibits no distension and no mass. There is no tenderness. There is no rebound and no guarding.  Genitourinary: Vagina normal and uterus normal. No vaginal discharge found.  Musculoskeletal: Normal range of motion. She exhibits no edema or tenderness.  Lymphadenopathy:    She has no cervical adenopathy.  Neurological: She is alert and  oriented to person, place, and time. She has normal reflexes. She displays normal reflexes. No cranial nerve deficit. She exhibits normal muscle tone. Coordination normal.  Skin: Skin is warm. No rash noted. She is not diaphoretic. No erythema. No pallor.  Psychiatric: She has a normal mood and affect. Her behavior is normal. Judgment and thought content normal.  Vitals reviewed.         Assessment & Plan:  Routine general medical examination at a health care facility - Plan: COMPLETE METABOLIC PANEL WITH GFR, CBC with Differential, Lipid panel, TSH, PAP, Thin Prep w/HPV rflx HPV Type  16/18  Agents physical exam today is completely normal. I will check a CBC, CMP, fasting lipid panel, and TSH. Her Pap smear was sent to pathology. The patient received her flu shot today in clinic. Recheck in 1 year or as needed. I did give her Flexeril to take sparingly at night to help with muscle spasms and fasciculations.

## 2014-08-13 ENCOUNTER — Encounter: Payer: Self-pay | Admitting: Family Medicine

## 2014-08-14 LAB — PAP, THIN PREP W/HPV RFLX HPV TYPE 16/18: HPV DNA High Risk: NOT DETECTED

## 2014-10-14 ENCOUNTER — Telehealth: Payer: Self-pay | Admitting: *Deleted

## 2014-10-14 DIAGNOSIS — IMO0001 Reserved for inherently not codable concepts without codable children: Secondary | ICD-10-CM

## 2014-10-14 NOTE — Telephone Encounter (Signed)
Pt called stating she went to [facility] yesterday with pain in foot L pinky toe painful, done xrays on her and did not see anything, pt states would like to have a referral to podiatrist as recommended by UC. ?ok to do referral

## 2014-10-14 NOTE — Telephone Encounter (Signed)
Ok with referral. 

## 2014-10-15 NOTE — Telephone Encounter (Signed)
Referral placed for podiatry

## 2014-10-24 ENCOUNTER — Ambulatory Visit (INDEPENDENT_AMBULATORY_CARE_PROVIDER_SITE_OTHER): Admitting: Podiatry

## 2014-10-24 ENCOUNTER — Ambulatory Visit (INDEPENDENT_AMBULATORY_CARE_PROVIDER_SITE_OTHER)

## 2014-10-24 ENCOUNTER — Encounter: Payer: Self-pay | Admitting: Podiatry

## 2014-10-24 VITALS — BP 123/69 | HR 88 | Resp 16 | Ht 65.0 in | Wt 136.0 lb

## 2014-10-24 DIAGNOSIS — G5792 Unspecified mononeuropathy of left lower limb: Secondary | ICD-10-CM

## 2014-10-24 DIAGNOSIS — M21622 Bunionette of left foot: Secondary | ICD-10-CM

## 2014-10-24 DIAGNOSIS — M205X2 Other deformities of toe(s) (acquired), left foot: Secondary | ICD-10-CM

## 2014-10-24 NOTE — Progress Notes (Signed)
   Subjective:    Patient ID: Tracey Malone, female    DOB: [birth year], 39 y.o.   MRN: [id number]  HPI Comments: Pain on the fifth met lateral side of foot that started in October , had a xray at urgent care last weekend . Tailor bunion left foot   Foot Pain      Review of Systems  All other systems reviewed and are negative.      Objective:   Physical Exam: I have reviewed her past mental history medications allergy surgery social history and review of systems area pulses are strongly palpable bilateral. Neurologic sensorium is intact per Semmes-Weinstein monofilament. Deep tendon reflexes are intact bilateral. Muscle strength is 5 over 5 dorsiflexion plantar flexors and inverters everters onto the musculature is intact. Orthopedic evaluation today demonstrates tailor's bunion deformity fifth metatarsal head left foot with overlying neuritis. Radiographs confirm irregularity to the head of the fifth metatarsal left foot with an increase in the fourth intermetatarsal angle resulting in the neuritis.      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  Plan: Injected the area today with dexamethasone and local anesthetic discussed appropriate shoe gear stretching exercises ice therapy with some the shoes and the possible need for surgical intervention.

## 2014-11-02 ENCOUNTER — Emergency Department (HOSPITAL_BASED_OUTPATIENT_CLINIC_OR_DEPARTMENT_OTHER)
Admission: EM | Admit: 2014-11-02 | Discharge: 2014-11-02 | Disposition: A | Discharge status: Home / Self Care | Attending: Emergency Medicine | Admitting: Emergency Medicine

## 2014-11-02 ENCOUNTER — Encounter (HOSPITAL_BASED_OUTPATIENT_CLINIC_OR_DEPARTMENT_OTHER): Payer: Self-pay

## 2014-11-02 ENCOUNTER — Emergency Department (HOSPITAL_BASED_OUTPATIENT_CLINIC_OR_DEPARTMENT_OTHER)

## 2014-11-02 DIAGNOSIS — Z880 Allergy status to penicillin: Secondary | ICD-10-CM | POA: Insufficient documentation

## 2014-11-02 DIAGNOSIS — Z8679 Personal history of other diseases of the circulatory system: Secondary | ICD-10-CM | POA: Insufficient documentation

## 2014-11-02 DIAGNOSIS — Z8669 Personal history of other diseases of the nervous system and sense organs: Secondary | ICD-10-CM | POA: Diagnosis not present

## 2014-11-02 DIAGNOSIS — S6992XA Unspecified injury of left wrist, hand and finger(s), initial encounter: Secondary | ICD-10-CM | POA: Diagnosis present

## 2014-11-02 DIAGNOSIS — Y999 Unspecified external cause status: Secondary | ICD-10-CM | POA: Insufficient documentation

## 2014-11-02 DIAGNOSIS — Y9289 Other specified places as the place of occurrence of the external cause: Secondary | ICD-10-CM | POA: Insufficient documentation

## 2014-11-02 DIAGNOSIS — S60212A Contusion of left wrist, initial encounter: Secondary | ICD-10-CM | POA: Insufficient documentation

## 2014-11-02 DIAGNOSIS — X58XXXA Exposure to other specified factors, initial encounter: Secondary | ICD-10-CM | POA: Diagnosis not present

## 2014-11-02 DIAGNOSIS — Y9389 Activity, other specified: Secondary | ICD-10-CM | POA: Diagnosis not present

## 2014-11-02 NOTE — ED Notes (Signed)
Patient here with left hand pain with bruising after pushing large binder into shelf. Heard a loud pop and has ongoing pain

## 2014-11-02 NOTE — ED Provider Notes (Signed)
CSN: [id number]     Arrival date & time 11/02/14  1244 History   First MD Initiated Contact with Patient 11/02/14 1325     Chief Complaint  Patient presents with  . Hand Injury     (Consider location/radiation/quality/duration/timing/severity/associated sxs/prior Treatment) HPI   Tracey Malone is a 39 y.o. female complaining of left hand and wrist pain after Pt impacted the wrist while putting a heavy binder in a bookcase x7 days ago. Pain had improved but persists and is rated at 4/10. Patient is been taking ibuprofen at home with good relief. She states that she has a fracture to this wrist in the past and so she is concerned that she broke it. She denies weakness, numbness, decreased range of motion. States that she had a bruise in the area which has largely resolved.  Past Medical History  Diagnosis Date  . Migraines   . Bell's palsy    Past Surgical History  Procedure Laterality Date  . Incontinence surgery    . Tubal ligation    . Hemorrhoid surgery     Family History  Problem Relation Age of Onset  . COPD Father    History  Substance Use Topics  . Smoking status: Never Smoker   . Smokeless tobacco: Not on file  . Alcohol Use: No   OB History    No data available     Review of Systems  10 systems reviewed and found to be negative, except as noted in the HPI.   Allergies  Codeine; Topamax; Treximet; and Penicillins  Home Medications   Prior to Admission medications   Not on File   BP 121/68 mmHg  Pulse 70  Temp(Src) 99 F (37.2 C) (Oral)  Resp 18  Ht  (1.626 m)  Wt 133 lb (60.328 kg)  BMI 22.82 kg/m2  SpO2 100% Physical Exam  Constitutional: She is oriented to person, place, and time. She appears well-developed and well-nourished. No distress.  HENT:  Head: Normocephalic.  Eyes: Conjunctivae and EOM are normal.  Cardiovascular: Normal rate, regular rhythm and intact distal pulses.   Pulmonary/Chest: Effort normal. No stridor.    Musculoskeletal: Normal range of motion.       Hands: Neurological: She is alert and oriented to person, place, and time.  Psychiatric: She has a normal mood and affect.  Nursing note and vitals reviewed.   ED Course  Procedures (including critical care time) Labs Review Labs Reviewed - No data to display  Imaging Review Dg Hand Complete Left  11/02/2014   CLINICAL DATA:  Pain at the base of the first metacarpal for 1 week since pushing a book onto a shelf. Initial encounter.  EXAM: LEFT HAND - COMPLETE 3+ VIEW  COMPARISON:  None.  FINDINGS: Imaged bones, joints and soft tissues appear normal.  IMPRESSION: Negative exam.   Electronically Signed   By: Drusilla Kanner M.D.   On: 11/02/2014 13:40     EKG Interpretation None      MDM   Final diagnoses:  Wrist contusion, left, initial encounter    Filed Vitals:   11/02/14 1252  BP: 121/68  Pulse: 70  Temp: 99 F (37.2 C)  TempSrc: Oral  Resp: 18  Height:  (1.626 m)  Weight: 133 lb (60.328 kg)  SpO2: 100%    Tracey Malone is a pleasant 39 y.o. female presenting with persistent left hand pain after she impacted the hand while putting a binder into a bookcase 7 days  ago. X-rays negative, physical exam with no significant abnormalities. Recommend applying heat to the area and close follow-up with primary care.  Evaluation does not show pathology that would require ongoing emergent intervention or inpatient treatment. Pt is hemodynamically stable and mentating appropriately. Discussed findings and plan with patient/guardian, who agrees with care plan. All questions answered. Return precautions discussed and outpatient follow up given.     Wynetta Emeryicole Britany Callicott, PA-C 11/02/14 1358  Ethelda ChickMartha K Linker, MD 11/02/14 1359

## 2014-11-02 NOTE — Discharge Instructions (Signed)
For pain control please take ibuprofen (also known as Motrin or Advil) 800mg  (this is normally 4 over the counter pills) 3 times a day  for 5 days. Take with food to minimize stomach irritation.  Please follow with your primary care doctor in the next 2 days for a check-up. They must obtain records for further management.   Do not hesitate to return to the Emergency Department for any new, worsening or concerning symptoms.    Contusion A contusion is a deep bruise. Contusions happen when an injury causes bleeding under the skin. Signs of bruising include pain, puffiness (swelling), and discolored skin. The contusion may turn blue, purple, or yellow. HOME CARE   Put ice on the injured area.  Put ice in a plastic bag.  Place a towel between your skin and the bag.  Leave the ice on for 15-20 minutes, 03-04 times a day.  Only take medicine as told by your doctor.  Rest the injured area.  If possible, raise (elevate) the injured area to lessen puffiness. GET HELP RIGHT AWAY IF:   You have more bruising or puffiness.  You have pain that is getting worse.  Your puffiness or pain is not helped by medicine. MAKE SURE YOU:   Understand these instructions.  Will watch your condition.  Will get help right away if you are not doing well or get worse. Document Released: 03/08/2008 Document Revised: 12/13/2011 Document Reviewed: 07/26/2011 [facility] Patient Information 2015 [place]. This information is not intended to replace advice given to you by your health care provider. Make sure you discuss any questions you have with your health care provider.

## 2015-06-18 ENCOUNTER — Telehealth: Payer: Self-pay | Admitting: Family Medicine

## 2015-06-18 NOTE — Telephone Encounter (Signed)
Called and spoke to pt and she states that it is a PRN med when she needs to focus. We have no rx it since 9/14. ? OK to Refill or does she need an ov?

## 2015-06-18 NOTE — Telephone Encounter (Signed)
Patient asking for rx for her ritalin [phone]

## 2015-06-19 NOTE — Telephone Encounter (Signed)
NTBS as has never been used.

## 2015-06-19 NOTE — Telephone Encounter (Signed)
Pt aware to make appt via vm

## 2016-01-27 ENCOUNTER — Encounter: Payer: Self-pay | Admitting: Family Medicine

## 2016-01-27 ENCOUNTER — Ambulatory Visit (INDEPENDENT_AMBULATORY_CARE_PROVIDER_SITE_OTHER): Admitting: Family Medicine

## 2016-01-27 VITALS — BP 108/60 | HR 78 | Temp 98.1°F | Resp 16 | Ht 65.0 in | Wt 150.0 lb

## 2016-01-27 DIAGNOSIS — Z0000 Encounter for general adult medical examination without abnormal findings: Secondary | ICD-10-CM

## 2016-01-27 LAB — TSH: TSH: 1.66 m[IU]/L

## 2016-01-27 LAB — COMPLETE METABOLIC PANEL WITH GFR
ALT: 17 U/L (ref 6–29)
AST: 15 U/L (ref 10–30)
Albumin: 4.5 g/dL (ref 3.6–5.1)
Alkaline Phosphatase: 56 U/L (ref 33–115)
BUN: 14 mg/dL (ref 7–25)
CHLORIDE: 104 mmol/L (ref 98–110)
CO2: 28 mmol/L (ref 20–31)
CREATININE: 0.72 mg/dL (ref 0.50–1.10)
Calcium: 9.2 mg/dL (ref 8.6–10.2)
GFR, Est African American: >89 mL/min (ref >=60)
GFR, Est Non African American: >89 mL/min (ref >=60)
GLUCOSE: 88 mg/dL (ref 70–99)
Potassium: 4.4 mmol/L (ref 3.5–5.3)
Sodium: 139 mmol/L (ref 135–146)
Total Bilirubin: 0.6 mg/dL (ref 0.2–1.2)
Total Protein: 6.8 g/dL (ref 6.1–8.1)

## 2016-01-27 LAB — CBC WITH DIFFERENTIAL/PLATELET
BASOS PCT: 0 %
Basophils Absolute: 0 cells/uL (ref 0–200)
Eosinophils Absolute: 130 cells/uL (ref 15–500)
Eosinophils Relative: 2 %
HCT: 36.9 % (ref 35.0–45.0)
HEMOGLOBIN: 12.2 g/dL (ref 12.0–15.0)
LYMPHS ABS: 1885 {cells}/uL (ref 850–3900)
LYMPHS PCT: 29 %
MCH: 30.1 pg (ref 27.0–33.0)
MCHC: 33.1 g/dL (ref 32.0–36.0)
MCV: 91.1 fL (ref 80.0–100.0)
MPV: 9 fL (ref 7.5–12.5)
Monocytes Absolute: 390 cells/uL (ref 200–950)
Monocytes Relative: 6 %
Neutro Abs: 4095 cells/uL (ref 1500–7800)
Neutrophils Relative %: 63 %
Platelets: 328 10*3/uL (ref 140–400)
RBC: 4.05 MIL/uL (ref 3.80–5.10)
RDW: 13.5 % (ref 11.0–15.0)
WBC: 6.5 10*3/uL (ref 3.8–10.8)

## 2016-01-27 LAB — LIPID PANEL
CHOLESTEROL: 191 mg/dL (ref 125–200)
HDL: 73 mg/dL (ref >=46)
LDL Cholesterol: 101 mg/dL (ref <130)
Total CHOL/HDL Ratio: 2.6 Ratio (ref <=5.0)
Triglycerides: 85 mg/dL (ref <150)
VLDL: 17 mg/dL (ref <30)

## 2016-01-27 NOTE — Progress Notes (Signed)
Subjective:    Patient ID: Tracey Malone, female    DOB: 12-Dec-1975, 40 y.o.   MRN: [id number]  HPI  Patient is a very pleasant 40 yo wf here for cpe.  Patient had Bell's palsy affecting the left side of her face in 2015. The strength is pretty much returned to her face although she still talks slightly abnormal due to some residual weakness in her lips. She is due for a pap and mammogram  Past Medical History  Diagnosis Date  . Migraines   . Bell's palsy    Past Surgical History  Procedure Laterality Date  . Incontinence surgery    . Tubal ligation    . Hemorrhoid surgery     No current outpatient prescriptions on file prior to visit.   No current facility-administered medications on file prior to visit.   Allergies  Allergen Reactions  . Codeine Shortness Of Breath  . Topamax [Topiramate]     Cognitive Slowing--types same word over and over. Cannot get out correct words when talking.  Colleen Can. Treximet [Sumatriptan-Naproxen Sodium] Shortness Of Breath and Swelling    Throat gets tight.  . Penicillins Swelling   Social History   Social History  . Marital Status: Married    Spouse Name: N/A  . Number of Children: N/A  . Years of Education: N/A   Occupational History  . Not on file.   Social History Main Topics  . Smoking status: Never Smoker   . Smokeless tobacco: Not on file  . Alcohol Use: No  . Drug Use: No  . Sexual Activity: Yes    Birth Control/ Protection: Surgical     Comment: Married to [employer], two kids.  Retired from [employer].   Other Topics Concern  . Not on file   Social History Narrative   Family History  Problem Relation Age of Onset  . COPD Father       Review of Systems  All other systems reviewed and are negative.      Objective:   Physical Exam  Constitutional: She is oriented to person, place, and time. She appears well-developed and well-nourished. No distress.  HENT:  Head: Normocephalic and atraumatic.  Right Ear: External ear  normal.  Left Ear: External ear normal.  Nose: Nose normal.  Mouth/Throat: Oropharynx is clear and moist. No oropharyngeal exudate.  Eyes: Conjunctivae and EOM are normal. Pupils are equal, round, and reactive to light. Right eye exhibits no discharge. Left eye exhibits no discharge. No scleral icterus.  Neck: Normal range of motion. Neck supple. No JVD present. No tracheal deviation present. No thyromegaly present.  Cardiovascular: Normal rate, regular rhythm, normal heart sounds and intact distal pulses.  Exam reveals no gallop and no friction rub.   No murmur heard. Pulmonary/Chest: Effort normal and breath sounds normal. No respiratory distress. She has no wheezes. She has no rales. She exhibits no tenderness.  Abdominal: Soft. Bowel sounds are normal. She exhibits no distension and no mass. There is no tenderness. There is no rebound and no guarding.  Genitourinary: Vagina normal and uterus normal. No vaginal discharge found.  Musculoskeletal: Normal range of motion. She exhibits no edema or tenderness.  Lymphadenopathy:    She has no cervical adenopathy.  Neurological: She is alert and oriented to person, place, and time. She has normal reflexes. No cranial nerve deficit. She exhibits normal muscle tone. Coordination normal.  Skin: Skin is warm. No rash noted. She is not diaphoretic. No erythema. No pallor.  Psychiatric: She has a normal mood and affect. Her behavior is normal. Judgment and thought content normal.  Vitals reviewed.         Assessment & Plan:  General medical exam - Plan: CBC with Differential/Platelet, COMPLETE METABOLIC PANEL WITH GFR, Lipid panel, TSH, PAP, Thin Prep w/HPV rflx HPV Type 16/18  Physical exam today is completely normal. I will check a CBC, CMP, fasting lipid panel, and TSH. Her Pap smear was sent to pathology. Will schedule mammogram.

## 2016-01-29 ENCOUNTER — Other Ambulatory Visit: Payer: Self-pay | Admitting: Family Medicine

## 2016-01-29 ENCOUNTER — Encounter: Payer: Self-pay | Admitting: Family Medicine

## 2016-01-29 DIAGNOSIS — Z1231 Encounter for screening mammogram for malignant neoplasm of breast: Secondary | ICD-10-CM

## 2016-01-30 LAB — PAP, THIN PREP W/HPV RFLX HPV TYPE 16/18: HPV DNA High Risk: NOT DETECTED

## 2016-02-11 ENCOUNTER — Ambulatory Visit
Admission: RE | Admit: 2016-02-11 | Discharge: 2016-02-11 | Disposition: A | Discharge status: Home / Self Care | Source: Ambulatory Visit | Attending: Family Medicine | Admitting: Family Medicine

## 2016-02-11 DIAGNOSIS — Z1231 Encounter for screening mammogram for malignant neoplasm of breast: Secondary | ICD-10-CM

## 2016-04-07 ENCOUNTER — Emergency Department (HOSPITAL_COMMUNITY)
Admission: EM | Admit: 2016-04-07 | Discharge: 2016-04-07 | Disposition: A | Discharge status: Home / Self Care | Attending: Emergency Medicine | Admitting: Emergency Medicine

## 2016-04-07 ENCOUNTER — Emergency Department (HOSPITAL_BASED_OUTPATIENT_CLINIC_OR_DEPARTMENT_OTHER)
Admit: 2016-04-07 | Discharge: 2016-04-07 | Disposition: A | Discharge status: Home / Self Care | Attending: Emergency Medicine | Admitting: Emergency Medicine

## 2016-04-07 ENCOUNTER — Encounter (HOSPITAL_COMMUNITY): Payer: Self-pay

## 2016-04-07 DIAGNOSIS — M79609 Pain in unspecified limb: Secondary | ICD-10-CM

## 2016-04-07 DIAGNOSIS — Z79899 Other long term (current) drug therapy: Secondary | ICD-10-CM | POA: Insufficient documentation

## 2016-04-07 DIAGNOSIS — M25562 Pain in left knee: Secondary | ICD-10-CM | POA: Insufficient documentation

## 2016-04-07 NOTE — ED Notes (Signed)
Pt presents with c/o left leg pain. Pt reports the pain is behind her left knee, no injury to that area. Pt reports she feels like her leg "feels thick" and her foot on that leg feels different than the other leg. Pt was told by a friend of hers that is a PA to come and be evaluated to rule out a blood clot.

## 2016-04-07 NOTE — Progress Notes (Signed)
*  Preliminary Results* Left lower extremity venous duplex completed. Left lower extremity is negative for deep vein thrombosis. There is no evidence of left Baker's cyst.  04/07/2016 4:09 PM  Gertie FeyMichelle Bacilio Abascal, RVT, RDCS, RDMS

## 2016-04-07 NOTE — ED Provider Notes (Signed)
CSN: [id number]     Arrival date & time 04/07/16  1436 History   First MD Initiated Contact with Patient 04/07/16 1457     Chief Complaint  Patient presents with  . Leg Pain      HPI Pt presents with c/o left leg pain. Pt reports the pain is behind her left knee, no injury to that area. Pt reports she feels like her leg "feels thick" and her foot on that leg feels different than the other leg. Pt was told by a friend of hers that is a PA to come and be evaluated to rule out a blood clot Past Medical History  Diagnosis Date  . Migraines   . Bell's palsy    Past Surgical History  Procedure Laterality Date  . Incontinence surgery    . Tubal ligation    . Hemorrhoid surgery     Family History  Problem Relation Age of Onset  . COPD Father    Social History  Substance Use Topics  . Smoking status: Never Smoker   . Smokeless tobacco: None  . Alcohol Use: No   OB History    No data available     Review of Systems  All other systems reviewed and are negative.     Allergies  Codeine; Topamax; Treximet; and Penicillins  Home Medications   Prior to Admission medications   Medication Sig Start Date End Date Taking? Authorizing Provider  naproxen sodium (ANAPROX) 220 MG tablet Take 220 mg by mouth every 12 (twelve) hours as needed (for pain).   Yes Historical Provider, MD   BP 119/74 mmHg  Pulse 81  Temp(Src) 98.2 F (36.8 C) (Oral)  Resp 14  SpO2 100%  LMP 03/28/2016 (Approximate) Physical Exam  Constitutional: She is oriented to person, place, and time. She appears well-developed and well-nourished. No distress.  HENT:  Head: Normocephalic and atraumatic.  Eyes: Pupils are equal, round, and reactive to light.  Neck: Normal range of motion.  Cardiovascular: Normal rate and intact distal pulses.   Pulmonary/Chest: No respiratory distress.  Abdominal: Normal appearance. She exhibits no distension.  Musculoskeletal: Normal range of motion.       Legs: Neurological:  She is alert and oriented to person, place, and time. No cranial nerve deficit.  Skin: Skin is warm and dry. No rash noted.  Psychiatric: She has a normal mood and affect. Her behavior is normal.  Nursing note and vitals reviewed.   ED Course  Procedures (including critical care time) Labs Review Labs Reviewed - No data to display  Imaging Review No results found. Preliminary report showed no evidence of DVT or Baker's cyst on venous Doppler.   MDM   Final diagnoses:  Popliteal pain        Nelva Nayobert Donato Studley, MD 04/07/16 1625

## 2016-04-07 NOTE — Discharge Instructions (Signed)
Heat Therapy Heat therapy can help ease sore, stiff, injured, and tight muscles and joints. Heat relaxes your muscles, which may help ease your pain.  RISKS AND COMPLICATIONS If you have any of the following conditions, do not use heat therapy unless your health care provider has approved:  Poor circulation.  Healing wounds or scarred skin in the area being treated.  Diabetes, heart disease, or high blood pressure.  Not being able to feel (numbness) the area being treated.  Unusual swelling of the area being treated.  Active infections.  Blood clots.  Cancer.  Inability to communicate pain. This may include young children and people who have problems with their brain function (dementia).  Pregnancy. Heat therapy should only be used on old, pre-existing, or long-lasting (chronic) injuries. Do not use heat therapy on new injuries unless directed by your health care provider. HOW TO USE HEAT THERAPY There are several different kinds of heat therapy, including:  Moist heat pack.  Warm water bath.  Hot water bottle.  Electric heating pad.  Heated gel pack.  Heated wrap.  Electric heating pad. Use the heat therapy method suggested by your health care provider. Follow your health care provider's instructions on when and how to use heat therapy. GENERAL HEAT THERAPY RECOMMENDATIONS  Do not sleep while using heat therapy. Only use heat therapy while you are awake.  Your skin may turn pink while using heat therapy. Do not use heat therapy if your skin turns red.  Do not use heat therapy if you have new pain.  High heat or long exposure to heat can cause burns. Be careful when using heat therapy to avoid burning your skin.  Do not use heat therapy on areas of your skin that are already irritated, such as with a rash or sunburn. SEEK MEDICAL CARE IF:  You have blisters, redness, swelling, or numbness.  You have new pain.  Your pain is worse. MAKE SURE  YOU:  Understand these instructions.  Will watch your condition.  Will get help right away if you are not doing well or get worse.   This information is not intended to replace advice given to you by your health care provider. Make sure you discuss any questions you have with your health care provider.   Document Released: 12/13/2011 Document Revised: 10/11/2014 Document Reviewed: 11/13/2013 Elsevier Interactive Patient Education 2016 Elsevier Inc.  Pain Without a Known Cause WHAT IS PAIN WITHOUT A KNOWN CAUSE? Pain can occur in any part of the body and can range from mild to severe. Sometimes no cause can be found for why you are having pain. Some types of pain that can occur without a known cause include:   Headache.  Back pain.  Abdominal pain.  Neck pain. HOW IS PAIN WITHOUT A KNOWN CAUSE DIAGNOSED?  Your health care provider will try to find the cause of your pain. This may include:  Physical exam.  Medical history.  Blood tests.  Urine tests.  X-rays. If no cause is found, your health care provider may diagnose you with pain without a known cause.  IS THERE TREATMENT FOR PAIN WITHOUT A CAUSE?  Treatment depends on the kind of pain you have. Your health care provider may prescribe medicines to help relieve your pain.  WHAT CAN I DO AT HOME FOR MY PAIN?   Take medicines only as directed by your health care provider.  Stop any activities that cause pain. During periods of severe pain, bed rest may help.  Try to reduce your stress with activities such as yoga or meditation. Talk to your health care provider for other stress-reducing activity recommendations.  Exercise regularly, if approved by your health care provider.  Eat a healthy diet that includes fruits and vegetables. This may improve pain. Talk to your health care provider if you have any questions about your diet. WHAT IF MY PAIN DOES NOT GET BETTER?  If you have a painful condition and no reason can be  found for the pain or the pain gets worse, it is important to follow up with your health care provider. It may be necessary to repeat tests and look further for a possible cause.    This information is not intended to replace advice given to you by your health care provider. Make sure you discuss any questions you have with your health care provider.   Document Released: 06/15/2001 Document Revised: 10/11/2014 Document Reviewed: 02/05/2014 Elsevier Interactive Patient Education [employer]2016 Elsevier Inc.

## 2016-04-13 ENCOUNTER — Encounter: Payer: Self-pay | Admitting: Family Medicine

## 2016-04-13 ENCOUNTER — Ambulatory Visit (INDEPENDENT_AMBULATORY_CARE_PROVIDER_SITE_OTHER): Admitting: Family Medicine

## 2016-04-13 VITALS — BP 112/70 | HR 80 | Temp 98.3°F | Resp 14 | Ht 65.0 in | Wt 149.0 lb

## 2016-04-13 DIAGNOSIS — M25562 Pain in left knee: Secondary | ICD-10-CM | POA: Diagnosis not present

## 2016-04-13 MED ORDER — MELOXICAM 15 MG PO TABS: 15.0000 mg | ORAL_TABLET | Freq: Every day | ORAL | Status: DC

## 2016-04-13 NOTE — Progress Notes (Signed)
Subjective:    Patient ID: Tracey Malone, female    DOB: [birth year], 40 y.o.   MRN: [id number]  HPI Patient reports swelling and pain behind her left knee. Symptoms occurred after she was painting and climbing up and down a ladder and stooping repetitively over the last week. Fluid accumulated behind the knee and she went to the emergency room where an ultrasound was negative for DVT. They also did not see a Baker's cyst at that time. The swelling comes and goes behind her knees suggestive of possibly a Baker's cyst. However her knee exam today is relatively benign. She has a negative Apley grind. She has a negative anterior posterior drawer sign. She has negative patellar grind. There is no erythema or effusion. She has no tenderness along the joint lines. However she states that the pain gradually occurs a she stands or walks on her knee throughout the day. Past Medical History  Diagnosis Date  . Migraines   . Bell's palsy    Past Surgical History  Procedure Laterality Date  . Incontinence surgery    . Tubal ligation    . Hemorrhoid surgery     No current outpatient prescriptions on file prior to visit.   No current facility-administered medications on file prior to visit.   Allergies  Allergen Reactions  . Codeine Shortness Of Breath  . Topamax [Topiramate]     Cognitive Slowing--types same word over and over. Cannot get out correct words when talking.  Colleen Can. Treximet [Sumatriptan-Naproxen Sodium] Shortness Of Breath and Swelling    Throat gets tight.  . Penicillins Swelling    Swelling unknown  Has patient had a PCN reaction causing immediate rash, facial/tongue/throat swelling, SOB or lightheadedness with hypotension: Unknown Has patient had a PCN reaction causing severe rash involving mucus membranes or skin necrosis: No  Has patient had a PCN reaction that required hospitalization: No  Has patient had a PCN reaction occurring within the last 10 years: No  If all of the above  answers are "NO", then may proceed with Cep   Social History   Social History  . Marital Status: Married    Spouse Name: N/A  . Number of Children: N/A  . Years of Education: N/A   Occupational History  . Not on file.   Social History Main Topics  . Smoking status: Never Smoker   . Smokeless tobacco: Not on file  . Alcohol Use: No  . Drug Use: No  . Sexual Activity: Yes    Birth Control/ Protection: Surgical     Comment: Married to [employer], two kids.  Retired from [employer].   Other Topics Concern  . Not on file   Social History Narrative      Review of Systems  All other systems reviewed and are negative.      Objective:   Physical Exam  Cardiovascular: Normal rate, regular rhythm and normal heart sounds.   Pulmonary/Chest: Effort normal and breath sounds normal.  Musculoskeletal:       Left knee: She exhibits normal range of motion, no swelling, no effusion, no erythema, normal alignment, no LCL laxity, no bony tenderness, normal meniscus and no MCL laxity. No medial joint line, no lateral joint line, no MCL and no LCL tenderness noted.  Vitals reviewed.         Assessment & Plan:  Left knee pain - Plan: meloxicam (MOBIC) 15 MG tablet  I suspect a Baker's cyst secondary to knee irritation from repetitive bending and  stooping or possibly a meniscal tear. Recommended conservative therapy with meloxicam 15 mg by mouth daily for 2 weeks and rest and ice and elevation. If no better at that point proceed with imaging of the left knee

## 2016-04-29 ENCOUNTER — Telehealth: Payer: Self-pay | Admitting: Family Medicine

## 2016-04-29 DIAGNOSIS — M25562 Pain in left knee: Secondary | ICD-10-CM

## 2016-04-29 NOTE — Telephone Encounter (Signed)
Order for MRI placed

## 2016-04-29 NOTE — Telephone Encounter (Signed)
Patient calling to say that her feet are not better and dr pickard mentioned to call back if not and that an mri could be scheduled  [phone]

## 2016-04-29 NOTE — Telephone Encounter (Signed)
Mri of left knee.

## 2016-04-29 NOTE — Telephone Encounter (Signed)
OK to order MRI?

## 2016-05-12 ENCOUNTER — Inpatient Hospital Stay: Admission: RE | Admit: 2016-05-12 | Source: Ambulatory Visit

## 2016-05-16 ENCOUNTER — Ambulatory Visit
Admission: RE | Admit: 2016-05-16 | Discharge: 2016-05-16 | Disposition: A | Discharge status: Home / Self Care | Source: Ambulatory Visit | Attending: Family Medicine | Admitting: Family Medicine

## 2016-05-19 ENCOUNTER — Encounter: Payer: Self-pay | Admitting: Family Medicine

## 2016-05-19 DIAGNOSIS — M25562 Pain in left knee: Secondary | ICD-10-CM

## 2016-05-21 NOTE — Telephone Encounter (Signed)
Referral placed and pt aware via mychart

## 2016-09-02 ENCOUNTER — Ambulatory Visit (INDEPENDENT_AMBULATORY_CARE_PROVIDER_SITE_OTHER): Admitting: Orthopaedic Surgery

## 2016-09-02 ENCOUNTER — Ambulatory Visit (INDEPENDENT_AMBULATORY_CARE_PROVIDER_SITE_OTHER)

## 2016-09-02 DIAGNOSIS — M25572 Pain in left ankle and joints of left foot: Secondary | ICD-10-CM

## 2016-09-02 DIAGNOSIS — M25571 Pain in right ankle and joints of right foot: Secondary | ICD-10-CM | POA: Diagnosis not present

## 2016-09-02 NOTE — Progress Notes (Signed)
q 

## 2016-09-02 NOTE — Progress Notes (Signed)
Office Visit Note   Patient: Tracey HiltsMichelle I Ohms           Date of Birth: Nov 05, 1975           MRN: [id number] Visit Date: 09/02/2016              Requested by: Donita BrooksWarren T Pickard, MD [address] [place], [place] [address] PCP: Leo GrosserPICKARD,WARREN TOM, MD   Assessment & Plan: Visit Diagnoses:  1. Pain in left ankle and joints of left foot   2. Pain in right ankle and joints of right foot     Plan: Regular try combination of physical therapy on her ankles as well as bilateral lace up ankle support. Her primary care physician recalled and some naproxen for her. I like see her back in about a month to see how she is doing overall. No x-rays of be needed at that visit.  Follow-Up Instructions: Return in about 4 weeks (around 09/30/2016).   Orders:  Orders Placed This Encounter  Procedures  . XR Ankle Complete Left  . XR Ankle Complete Right   No orders of the defined types were placed in this encounter.     Procedures: No procedures performed   Clinical Data: No additional findings.   Subjective: Chief Complaint  Patient presents with  . Right Ankle - Injury    Patient states she got bit by a lot of fire ants in October and also fell in a hole. She thought she was really swollen because of the ants. She is still swollen and having a lot of pain. Left ankle has really been bothering her aswell  . Left Ankle - Pain    HPI Both ankles are swollen quite a bit but the left one is less swollen the right limb. The right was had a lot more swelling as well. She states that they're less swollen the beginning of the day but she is on her feet all day Sunday hurt more so at the end of the day and swell more than a day. Review of Systems She denies any current headache, chest pain, shortness of breath, fever, chills, nausea, vomiting.  Objective: Vital Signs: There were no vitals taken for this visit.  Physical Exam She is alert and oriented 3 in no acute distress he gets up  on the exam table easily. Ortho Exam Both ankles her ligaments are stable. The left is less swollen the right ankle. Both ankles hurt over the anterior talofibular ligament. Both ankles have an intact Achilles exam as well as no pain of the deltoid ligament. Both ankles have slight pain laterally as well. Specialty Comments:  No specialty comments available.  Imaging: Xr Ankle Complete Left  Result Date: 09/02/2016 Views of her left ankle obtained AP, mortise, lateral and shows a well located ankle with no bony irregularities or no acute injury. There is no significant soft tissue swelling either. The alignment is well maintained.  Xr Ankle Complete Right  Result Date: 09/02/2016 3 views of her right ankle AP, lateral, mortise show well located ankle with no acute bony injury but there is some slight soft tissue swelling. Everything aligns normally otherwise.    PMFS History: Patient Active Problem List   Diagnosis Date Noted  . Bell's palsy   . UTI (urinary tract infection) 06/27/2013  . GERD (gastroesophageal reflux disease) 06/27/2013  . ADD (attention deficit disorder) 06/27/2013  . Migraines    Past Medical History:  Diagnosis Date  . Bell's  palsy   . Migraines     Family History  Problem Relation Age of Onset  . COPD Father     Past Surgical History:  Procedure Laterality Date  . HEMORRHOID SURGERY    . INCONTINENCE SURGERY    . TUBAL LIGATION     Social History   Occupational History  . Not on file.   Social History Main Topics  . Smoking status: Never Smoker  . Smokeless tobacco: Not on file  . Alcohol use No  . Drug use: No  . Sexual activity: Yes    Birth control/ protection: Surgical     Comment: Married to [employer] Corporationim, two kids.  Retired from [employer].

## 2016-09-06 ENCOUNTER — Ambulatory Visit: Attending: Orthopaedic Surgery | Admitting: Physical Therapy

## 2016-09-06 DIAGNOSIS — M25571 Pain in right ankle and joints of right foot: Secondary | ICD-10-CM | POA: Diagnosis present

## 2016-09-06 DIAGNOSIS — R262 Difficulty in walking, not elsewhere classified: Secondary | ICD-10-CM | POA: Diagnosis present

## 2016-09-06 DIAGNOSIS — M25572 Pain in left ankle and joints of left foot: Secondary | ICD-10-CM | POA: Diagnosis not present

## 2016-09-06 DIAGNOSIS — R600 Localized edema: Secondary | ICD-10-CM | POA: Diagnosis present

## 2016-09-06 NOTE — Patient Instructions (Signed)
Gastroc / Heel Cord Stretch - Seated With Towel   Sit on floor, towel around ball of foot. Gently pull foot in toward body, stretching heel cord and calf. Hold for ___ seconds. Repeat on involved leg. Repeat ___ times. Do ___ times per day.  Copyright  VHI. All rights reserved.  ANKLE: Eversion, Unilateral (Band)    Place band around left foot. Keeping heel in place, raise toes of banded foot up and away from body. Do not move hip. Hold __10_ seconds. Use _____GREEN___ band. __10-20_ reps per set, __2-3_ sets per day, _7__ days per week  Copyright  VHI. All rights reserved.    Apply a compressive ACE bandage. Rest and elevate the affected painful area.  Apply cold compresses intermittently as needed.  As pain recedes, begin normal activities slowly as tolerated.  Call if symptoms persist.  RICE!!

## 2016-09-06 NOTE — Therapy (Signed)
[facility] [address] [place], [place], [address] Phone: [phone]   Fax:  [phone]  Physical Therapy Evaluation  Patient Details  Name: Tracey HiltsMichelle I Dame MRN: [id number] Date of Birth: 07-11-1976 Referring Provider: Dr. Doneen Poissonhristopher Blackman   Encounter Date: 09/06/2016      PT End of Session - 09/06/16 1209    Visit Number 1   Number of Visits 16   Date for PT Re-Evaluation 11/01/16   PT Start Time 1105   PT Stop Time 1148   PT Time Calculation (min) 43 min   Activity Tolerance Patient tolerated treatment well   Behavior During Therapy [facility] for tasks assessed/performed      Past Medical History:  Diagnosis Date  . Bell's palsy   . Migraines     Past Surgical History:  Procedure Laterality Date  . HEMORRHOID SURGERY    . INCONTINENCE SURGERY    . TUBAL LIGATION      There were no vitals filed for this visit.       Subjective Assessment - 09/06/16 1108    Subjective Patient presents with bilateral ankle pain from 2 separate incidents. In July, she had a Bakers cyst on LLE and altered gait which caused her to turn ankle.  In Oct. she stepped in a hole and was stung by fire ants.  She was treated for swelling from the bite, did not realizee there was an underlying ankle injury.  The  Rt. is more painful, more swollen than the Lt.  She has brace for her LLE and a boot for her Rt. side.  She has difficulty walking, has noticed some LE deconditioning squat, fatigued at end of the day.  Instructed to wear boot for 4 weeks and ankle ASO as well.    Pertinent History bilateral lateral release 2012   Limitations House hold activities;Walking;Standing   How long can you walk comfortably? walks the majority of the day at work    Diagnostic tests XR bilateral, normal with soft tissue swelling on Rt. LE    Patient Stated Goals Regain normal mobility.    Currently in Pain? Yes   Pain Score 5    Pain Location Ankle   Pain  Orientation Right;Left;Lateral;Anterior   Pain Descriptors / Indicators Sharp   Pain Type Chronic pain   Pain Onset More than a month ago   Pain Frequency Intermittent   Aggravating Factors  walking, standing    Pain Relieving Factors cortisone shot for knee, braces, meds, no ice    Effect of Pain on Daily Activities impacts her comfort at work, wants to get back to walking, has gained weight            [facility] - 09/06/16 1116      Assessment   Medical Diagnosis bilateral ankle sprains    Referring Provider Dr. Doneen Poissonhristopher Blackman    Onset Date/Surgical Date --  July 2017, Oct. 2017   Next MD Visit 4 weeks    Prior Therapy yes for knees      Precautions   Precautions None     Restrictions   Weight Bearing Restrictions No     Balance Screen   Has the patient fallen in the past 6 months No     Home Environment   Living Environment Private residence   Living Arrangements Spouse/significant other;Children   Type of Home House   Home Access Stairs to enter   Home Layout Multi-level  Prior Function   Level of Independence Independent   Vocation Full time employment   Vocation Requirements walking, also office work      Cognition   Overall Cognitive Status Within Functional Limits for tasks assessed     Figure 8 Edema   Figure 8 - Right  19 inch    Figure 8 - Left  18 3/8 inch      Sensation   Light Touch Impaired by gross assessment   Additional Comments has some numbness in Rt. ankle as edema builds      Squat   Comments tightness in post Rt. ankle      Single Leg Stance   Comments increased pain on Rt., mod difficulty, min diff on L      Posture/Postural Control   Posture Comments none remarkable      AROM   Right Ankle Dorsiflexion 16  PROM to 0 deg with pain    Right Ankle Inversion 25   Right Ankle Eversion 5   Left Ankle Dorsiflexion 5   Left Ankle Inversion 30   Left Ankle Eversion 10     Strength   Right Knee Flexion 4+/5    Right Knee Extension 4+/5   Left Knee Flexion 4+/5   Left Knee Extension 4+/5   Right Ankle Dorsiflexion 4+/5   Right Ankle Plantar Flexion 4+/5   Right Ankle Inversion 4+/5   Right Ankle Eversion 3+/5  pain    Left Ankle Dorsiflexion 5/5   Left Ankle Plantar Flexion 4+/5   Left Ankle Inversion 4+/5   Left Ankle Eversion 4+/5     Palpation   Palpation comment pain Rt. talofibular area on Rt. side, min tenderness L ankle laterally.              PT Education - 09/06/16 1208    Education provided Yes   Education Details PT/POC, HEP, ankle stability, joint receptors   Person(s) Educated Patient   Methods Explanation;Demonstration;Handout   Comprehension Verbalized understanding;Returned demonstration          PT Short Term Goals - 09/06/16 1215      PT SHORT TERM GOAL #1   Title Pt will be I with HEP for bilateral LEs, ankles    Time 4   Period Weeks   Status New     PT SHORT TERM GOAL #2   Title Pt will be out of the Boot and walk without a limp (as MD allows) for short distances   Time 4   Status New     PT SHORT TERM GOAL #3   Title Pt will report only min swelling end of the day after working.    Time 4   Period Weeks   Status New     PT SHORT TERM GOAL #4   Title Pt will use RICE consistently at home for pain and swelling relief.    Time 4   Period Weeks   Status New           PT Long Term Goals - 09/06/16 1218      PT LONG TERM GOAL #1   Title Pt will be I with concepts of RICE and HEP (more advanced)    Time 8   Period Weeks   Status New     PT LONG TERM GOAL #2   Title Pt will be able to work a full day with min pain, fatigue at the end of the day   Time 8  Period Weeks   Status New     PT LONG TERM GOAL #3   Title Pt will be able to show good balance control with SLS for min dynamic activities.    Time 8   Period Weeks   Status New     PT LONG TERM GOAL #4   Title Pt will be able to squat without pain or tightness in post Rt.  ankle.    Time 8   Period Weeks   Status New               Plan - 09-Sep-2016 1209    Clinical Impression Statement Patient with low complexity eval for bilateral ankle sprains.  She has mild ROM deficits Rt>Lt. , min weakness and decreased stability in ankle with weightbearing.  Swelling in Rt. LE min today, per patient it is severe at night.  She was given basic HEP, advised to ice nightly and elevate, comply with brace wear. No increased pain post session.     Rehab Potential Excellent   PT Frequency 2x / week   PT Duration 8 weeks   PT Treatment/Interventions ADLs/Self Care Home Management;Therapeutic activities;Therapeutic exercise;Ultrasound;Manual techniques;Taping;Neuromuscular re-education;Cryotherapy;Electrical Stimulation;Iontophoresis 4mg /ml Dexamethasone;Functional mobility training;Patient/family education;Vasopneumatic Device   PT Next Visit Plan check HEP, progress standing ankle activities, ice/tape post   PT Home Exercise Plan ankle DF/gastroc stretching, eversion green band for both ankles    Consulted and Agree with Plan of Care Patient      Patient will benefit from skilled therapeutic intervention in order to improve the following deficits and impairments:  Difficulty walking, Pain, Impaired flexibility, Decreased balance, Decreased mobility, Decreased strength, Increased edema, Impaired sensation  Visit Diagnosis: Pain in left ankle and joints of left foot  Pain in right ankle and joints of right foot  Difficulty walking  Localized edema      G-Codes - September 09, 2016 1222    Functional Assessment Tool Used clinical judgement   Functional Limitation Mobility: Walking and moving around   Mobility: Walking and Moving Around Current Status [phone]) At least 40 percent but less than 60 percent impaired, limited or restricted   Mobility: Walking and Moving Around Goal Status [phone]) At least 1 percent but less than 20 percent impaired, limited or restricted        Problem List Patient Active Problem List   Diagnosis Date Noted  . Bell's palsy   . UTI (urinary tract infection) 06/27/2013  . GERD (gastroesophageal reflux disease) 06/27/2013  . ADD (attention deficit disorder) 06/27/2013  . Migraines     PAA,JENNIFER 2016/09/09, 12:24 PM  [facility] [address] [place], [place], [address] Phone: [phone]   Fax:  [phone]  Name: JASLYNN THOME MRN: [id number] Date of Birth: 12/20/1975  Karie Mainland, PT 09-09-16 12:24 PM Phone: [phone] Fax: [phone]

## 2016-09-14 ENCOUNTER — Ambulatory Visit: Admitting: Physical Therapy

## 2016-09-14 DIAGNOSIS — M25571 Pain in right ankle and joints of right foot: Secondary | ICD-10-CM

## 2016-09-14 DIAGNOSIS — R262 Difficulty in walking, not elsewhere classified: Secondary | ICD-10-CM

## 2016-09-14 DIAGNOSIS — R600 Localized edema: Secondary | ICD-10-CM

## 2016-09-14 DIAGNOSIS — M25572 Pain in left ankle and joints of left foot: Secondary | ICD-10-CM | POA: Diagnosis not present

## 2016-09-14 NOTE — Therapy (Signed)
[facility] [address] [place], [place], [address] Phone: [phone]   Fax:  [phone]  Physical Therapy Treatment  Patient Details  Name: Tracey HiltsMichelle I Malone MRN: [id number] Date of Birth: 1975/11/04 Referring Provider: Dr. Doneen Poissonhristopher Blackman   Encounter Date: 09/14/2016      PT End of Session - 09/14/16 1101    Visit Number 2   Number of Visits 16   Date for PT Re-Evaluation 11/01/16   PT Start Time 1017   PT Stop Time 1100   PT Time Calculation (min) 43 min   Activity Tolerance Patient tolerated treatment well   Behavior During Therapy [facility] for tasks assessed/performed      Past Medical History:  Diagnosis Date  . Bell's palsy   . Migraines     Past Surgical History:  Procedure Laterality Date  . HEMORRHOID SURGERY    . INCONTINENCE SURGERY    . TUBAL LIGATION      There were no vitals filed for this visit.      Subjective Assessment - 09/14/16 1026    Subjective No changes, reports difficulty driving with brace and boot.  Wears tight socks to PT.  No pain.    Currently in Pain? No/denies             [facility] Treatment/Exercise - 09/14/16 1033      Ankle Exercises: Aerobic   Stationary Bike NuStep LE only level 5, 6 min      Ankle Exercises: Seated   Towel Crunch Other (comment)  1 min each foot    Towel Inversion/Eversion 5 reps;Other (comment)  each foot    Other Seated Ankle Exercises theraband green for Eversion x 20   cues for proper technique      Ankle Exercises: Stretches   Slant Board Stretch 3 reps;30 seconds  bilat.    Other Stretch towel stretch x 1 x 30 sec for HEP check      Ankle Exercises: Standing   SLS foam board 10 sec x 3 static hold with sl knee flexion    Heel Raises 20 reps   Heel Raises Limitations 3 sets, parallel and turn out with ball, turn in with no ball                 PT Education - 09/14/16 1205    Education provided Yes   Education  Details heel raises, alignment    Person(s) Educated Patient   Methods Explanation;Handout   Comprehension Verbalized understanding;Returned demonstration          PT Short Term Goals - 09/14/16 1216      PT SHORT TERM GOAL #1   Title Pt will be I with HEP for bilateral LEs, ankles    Status On-going     PT SHORT TERM GOAL #2   Title Pt will be out of the Boot and walk without a limp (as MD allows) for short distances   Status On-going     PT SHORT TERM GOAL #3   Title Pt will report only min swelling end of the day after working.    Status On-going     PT SHORT TERM GOAL #4   Title Pt will use RICE consistently at home for pain and swelling relief.    Status Achieved           PT Long Term Goals - 09/14/16 1218      PT LONG TERM GOAL #1   Title Pt  will be I with concepts of RICE and HEP (more advanced)    Status On-going     PT LONG TERM GOAL #2   Title Pt will be able to work a full day with min pain, fatigue at the end of the day   Status On-going     PT LONG TERM GOAL #3   Title Pt will be able to show good balance control with SLS for min dynamic activities.    Status On-going     PT LONG TERM GOAL #4   Title Pt will be able to squat without pain or tightness in post Rt. ankle.    Status On-going               Plan - 09/14/16 1206    Clinical Impression Statement Patient doing well, able to keep good alignment with standing ex .  Needed correction for eversion with T band.     PT Next Visit Plan show her supine ev with long green band , progress standing ankle activities, ice/tape post   PT Home Exercise Plan ankle DF/gastroc stretching, eversion green band for both ankles    Consulted and Agree with Plan of Care Patient      Patient will benefit from skilled therapeutic intervention in order to improve the following deficits and impairments:  Difficulty walking, Pain, Impaired flexibility, Decreased balance, Decreased mobility, Decreased  strength, Increased edema, Impaired sensation  Visit Diagnosis: Pain in left ankle and joints of left foot  Pain in right ankle and joints of right foot  Difficulty walking  Localized edema     Problem List Patient Active Problem List   Diagnosis Date Noted  . Bell's palsy   . UTI (urinary tract infection) 06/27/2013  . GERD (gastroesophageal reflux disease) 06/27/2013  . ADD (attention deficit disorder) 06/27/2013  . Migraines     PAA,JENNIFER 09/14/2016, 12:19 PM  [facility] [address] [place], [place], [address] Phone: [phone]   Fax:  [phone]  Name: Tracey HiltsMichelle I Formica MRN: [id number] Date of Birth: [birth year]  Tracey MainlandJennifer Paa, PT 09/14/16 12:49 PM Phone: [phone] Fax: [phone]

## 2016-09-14 NOTE — Patient Instructions (Signed)
Heel Raises    Stand with support. Tighten pelvic floor and hold. With knees straight, raise heels off ground. Hold _5__ seconds. Relax for _1__ seconds. Repeat __20_ times. Do __2_ times a day.  Copyright  VHI. All rights reserved.

## 2016-09-15 ENCOUNTER — Encounter: Payer: Self-pay | Admitting: Physician Assistant

## 2016-09-15 ENCOUNTER — Ambulatory Visit (INDEPENDENT_AMBULATORY_CARE_PROVIDER_SITE_OTHER): Admitting: Physician Assistant

## 2016-09-15 VITALS — BP 110/78 | HR 88 | Temp 98.3°F | Resp 16 | Wt 146.0 lb

## 2016-09-15 DIAGNOSIS — M542 Cervicalgia: Secondary | ICD-10-CM | POA: Diagnosis not present

## 2016-09-15 MED ORDER — METAXALONE 800 MG PO TABS: 800.0000 mg | ORAL_TABLET | Freq: Three times a day (TID) | ORAL | 1 refills | Status: DC

## 2016-09-15 NOTE — Progress Notes (Signed)
Patient ID: Tracey Malone MRN: [id number], DOB: Jul 18, 1976, 40 y.o. Date of Encounter: 09/15/2016, 11:58 AM    Chief Complaint:  Chief Complaint  Patient presents with  . Neck Pain    x4wks     HPI: 40 y.o. year old female presents with above.   Says that at the end of November one morning when she woke up she had what she felt was "a crick" in the left side of her neck. Says then on December 5 she had her hair appointment and when they laid her back to wash her hair in the sink she developed increased pain in the left posterior neck. That night she went to [employer] for massage. However that didn't really help much. Has continued with pain and tightness in the left posterior neck. Has had no pain no numbness no tingling no weakness down either arm or hand. No other concerns to discuss today.     Home Meds:   Outpatient Medications Prior to Visit  Medication Sig Dispense Refill  . naproxen (NAPROSYN) 500 MG tablet     . meloxicam (MOBIC) 15 MG tablet Take 1 tablet (15 mg total) by mouth daily. (Patient not taking: Reported on 09/15/2016) 30 tablet 0  . phentermine 37.5 MG capsule once as needed.  1   No facility-administered medications prior to visit.     Allergies:  Allergies  Allergen Reactions  . Codeine Shortness Of Breath  . Topamax [Topiramate]     Cognitive Slowing--types same word over and over. Cannot get out correct words when talking.  Colleen Can. Treximet [Sumatriptan-Naproxen Sodium] Shortness Of Breath and Swelling    Throat gets tight.  . Penicillins Swelling    Swelling unknown  Has patient had a PCN reaction causing immediate rash, facial/tongue/throat swelling, SOB or lightheadedness with hypotension: Unknown Has patient had a PCN reaction causing severe rash involving mucus membranes or skin necrosis: No  Has patient had a PCN reaction that required hospitalization: No  Has patient had a PCN reaction occurring within the last 10 years: No  If all of  the above answers are "NO", then may proceed with Cep      Review of Systems: See HPI for pertinent ROS. All other ROS negative.    Physical Exam: Blood pressure 110/78, pulse 88, temperature 98.3 F (36.8 C), temperature source Oral, resp. rate 16, weight 146 lb (66.2 kg), last menstrual period 09/03/2016, SpO2 98 %., Body mass index is 24.3 kg/m. General:  WNWD WF. Appears in no acute distress. Neck: Supple. No thyromegaly. No lymphadenopathy. Lungs: Clear bilaterally to auscultation without wheezes, rales, or rhonchi. Breathing is unlabored. Heart: Regular rhythm. No murmurs, rubs, or gallops. Msk:  Strength and tone normal for age. Left posterior neck-----There is area of pain with palpation over this area.  She has full range of motion of the neck. It is just that when she tilts her head back and looks up this is what causes pain in the left posterior neck. 5/5 grip strength bilaterally and upper extremity strength bilaterally. Extremities/Skin: Warm and dry.  Neuro: Alert and oriented X 3. Moves all extremities spontaneously. Gait is normal. CNII-XII grossly in tact. Psych:  Responds to questions appropriately with a normal affect.     ASSESSMENT AND PLAN:  40 y.o. year old female with  1. Neck pain on left side Discussed that muscle relaxers cause some people to have drowsiness. Told her to try the first dose of this this evening when she  gets home from work. If causes no drowsiness then take this 3 times a day. If causes drowsiness then only take at bedtime. Also recommended applying heat to the area using warm water in the shower or heating pad. Also recommend gently stretching the neck and also range of motion of the shoulders throughout the day.  Discussed also using a prednisone taper but she defers. I told her that if she is getting no relief after the above treatment for a week to call and would consider adding prednisone taper her other medicine for relief. - metaxalone  (SKELAXIN) 800 MG tablet; Take 1 tablet (800 mg total) by mouth 3 (three) times daily.  Dispense: 30 tablet; Refill: 1   Signed, [address]Tracey Malone, [place], [facility] 09/15/2016 11:58 AM

## 2016-09-17 ENCOUNTER — Ambulatory Visit: Admitting: Physical Therapy

## 2016-09-17 DIAGNOSIS — R600 Localized edema: Secondary | ICD-10-CM

## 2016-09-17 DIAGNOSIS — M25572 Pain in left ankle and joints of left foot: Secondary | ICD-10-CM

## 2016-09-17 DIAGNOSIS — R262 Difficulty in walking, not elsewhere classified: Secondary | ICD-10-CM

## 2016-09-17 DIAGNOSIS — M25571 Pain in right ankle and joints of right foot: Secondary | ICD-10-CM

## 2016-09-17 NOTE — Therapy (Signed)
[facility] [address] [place], [place], [address] Phone: [phone]   Fax:  [phone]  Physical Therapy Treatment  Patient Details  Name: Tracey HiltsMichelle I Malone MRN: [id number] Date of Birth: 1976/05/07 Referring Provider: Dr. Doneen Poissonhristopher Blackman   Encounter Date: 09/17/2016      PT End of Session - 09/17/16 1159    Visit Number 3   Number of Visits 16   Date for PT Re-Evaluation 11/01/16   PT Start Time 1023   PT Stop Time 1058   PT Time Calculation (min) 35 min   Activity Tolerance Patient tolerated treatment well   Behavior [facility] for tasks assessed/performed      Past Medical History:  Diagnosis Date  . Bell's palsy   . Migraines     Past Surgical History:  Procedure Laterality Date  . HEMORRHOID SURGERY    . INCONTINENCE SURGERY    . TUBAL LIGATION      There were no vitals filed for this visit.      Subjective Assessment - 09/17/16 1027    Subjective Pain is 7/10 in L ankle, did alot of walking yesterday. Popped as I stood up from lying flat on my back.  (Floor to stand transfer)    Currently in Pain? Yes   Pain Score 7    Pain Location Ankle   Pain Orientation Left;Lateral   Pain Descriptors / Indicators Dull   Pain Type Chronic pain   Pain Onset More than a month ago   Pain Frequency Intermittent   Aggravating Factors  going to stand (from lying flat)               [facility] Adult PT Treatment/Exercise - 09/17/16 1031      Self-Care   Self-Care Other Self-Care Comments   Other Self-Care Comments  Kinesiotape      Ultrasound   Ultrasound Location lateral L ankle      Manual Therapy   Kinesiotex Facilitate Muscle;Ligament Correction     Kinesiotix   Facilitate Muscle  4 "I" strips for lateral ankle sprain      Ankle Exercises: Standing   Rocker Board 3 minutes  A/P balance with min UE, and M/L balance as well    Heel Raises 20 reps;Other (comment)   Heel Raises  Limitations off step , also done single leg with eccentric focus each leg x 10    Other Standing Ankle Exercises Rocker board UE exercises with 3 lb weight: front raise, bicep curl, lateral raise, and alt flex, abd x 10 each                  PT Education - 09/17/16 1159    Education provided Yes   Education Details Kinesiotape, US    Person(s) Educated Patient   Methods Explanation   Comprehension Verbalized understanding          PT Short Term Goals - 09/14/16 1216      PT SHORT TERM GOAL #1   Title Pt will be I with HEP for bilateral LEs, ankles    Status On-going     PT SHORT TERM GOAL #2   Title Pt will be out of the Boot and walk without a limp (as MD allows) for short distances   Status On-going     PT SHORT TERM GOAL #3   Title Pt will report only min swelling end of the day after working.    Status On-going  PT SHORT TERM GOAL #4   Title Pt will use RICE consistently at home for pain and swelling relief.    Status Achieved           PT Long Term Goals - 09/14/16 1218      PT LONG TERM GOAL #1   Title Pt will be I with concepts of RICE and HEP (more advanced)    Status On-going     PT LONG TERM GOAL #2   Title Pt will be able to work a full day with min pain, fatigue at the end of the day   Status On-going     PT LONG TERM GOAL #3   Title Pt will be able to show good balance control with SLS for min dynamic activities.    Status On-going     PT LONG TERM GOAL #4   Title Pt will be able to squat without pain or tightness in post Rt. ankle.    Status On-going               Plan - 09/17/16 1200    Clinical Impression Statement Patient with increase in pain today, when going from supine to standing (both feet DF and pushing up).  Suggested she get out of the bath a different way to avoid compression and ligament strain. Trial of US and tape to increase stability and ankle support.    PT Next Visit Plan show her supine ev with long  green band , progress standing ankle activities, ice/tape post   PT Home Exercise Plan ankle DF/gastroc stretching, eversion green band for both ankles    Consulted and Agree with Plan of Care Patient      Patient will benefit from skilled therapeutic intervention in order to improve the following deficits and impairments:  Difficulty walking, Pain, Impaired flexibility, Decreased balance, Decreased mobility, Decreased strength, Increased edema, Impaired sensation  Visit Diagnosis: Pain in left ankle and joints of left foot  Pain in right ankle and joints of right foot  Difficulty walking  Localized edema     Problem List Patient Active Problem List   Diagnosis Date Noted  . Bell's palsy   . UTI (urinary tract infection) 06/27/2013  . GERD (gastroesophageal reflux disease) 06/27/2013  . ADD (attention deficit disorder) 06/27/2013  . Migraines     PAA,JENNIFER 09/17/2016, 12:02 PM  [facility] [address] [place], [place], [address] Phone: [phone]   Fax:  [phone]  Name: Tracey Malone MRN: [id number] Date of Birth: 1976/03/25   Karie MainlandJennifer Paa, PT 09/17/16 12:03 PM Phone: [phone] Fax: [phone]

## 2016-09-20 ENCOUNTER — Telehealth: Payer: Self-pay

## 2016-09-20 ENCOUNTER — Encounter: Payer: Self-pay | Admitting: Physician Assistant

## 2016-09-20 DIAGNOSIS — M542 Cervicalgia: Secondary | ICD-10-CM

## 2016-09-20 NOTE — Telephone Encounter (Signed)
X ray order has been placed notified pt via my chart

## 2016-09-21 ENCOUNTER — Ambulatory Visit: Admitting: Physical Therapy

## 2016-09-21 DIAGNOSIS — R262 Difficulty in walking, not elsewhere classified: Secondary | ICD-10-CM

## 2016-09-21 DIAGNOSIS — R600 Localized edema: Secondary | ICD-10-CM

## 2016-09-21 DIAGNOSIS — M25571 Pain in right ankle and joints of right foot: Secondary | ICD-10-CM

## 2016-09-21 DIAGNOSIS — M25572 Pain in left ankle and joints of left foot: Secondary | ICD-10-CM | POA: Diagnosis not present

## 2016-09-21 NOTE — Therapy (Signed)
[place], [place], [address] Phone: [phone]   Fax:  [phone]  Physical Therapy Treatment  Patient Details  Name: Tracey Malone MRN: [id number] Date of Birth: [birth year] Referring Provider: Dr. Jean Rosenthal   Encounter Date: 09/21/2016      PT End of Session - 09/21/16 1025    Visit Number 4   Number of Visits 16   Date for PT Re-Evaluation 11/01/16   PT Start Time 4709   PT Stop Time 1055   PT Time Calculation (min) 40 min   Activity Tolerance Patient tolerated treatment well   Behavior During Therapy [facility] for tasks assessed/performed      Past Medical History:  Diagnosis Date  . Bell's palsy   . Migraines     Past Surgical History:  Procedure Laterality Date  . HEMORRHOID SURGERY    . INCONTINENCE SURGERY    . TUBAL LIGATION      There were no vitals filed for this visit.      Subjective Assessment - 09/21/16 1016    Subjective Still feels unstable.  Tape helped a bit.  Same pain L ankle. Rt. LE swollen end of the day, doesnt feel as tight.    Currently in Pain? Yes   Pain Score 2    Pain Location Ankle   Pain Orientation Left   Pain Type Chronic pain   Pain Onset More than a month ago   Pain Frequency Intermittent   Aggravating Factors  when it pops   Pain Relieving Factors meds, brace and ice, tape              [facility] Adult PT Treatment/Exercise - 09/21/16 1021      Knee/Hip Exercises: Machines for Strengthening   Other Machine Pilates Reformer see note         Pilates Reformer used for LE/core strength, postural strength, lumbopelvic disassociation and core control.  Exercises included: Footwork 2 Red 1 blue double leg press on heels, arches and forefoot.  Done in parallel and with slight turnout, which increases pain.   Heel raises with calf stretching 2 red Single leg press 2 Red 1 blue 2 x 10 each side, LLE done on forefoot for increasing PF  strength.          [place] Adult PT Treatment/Exercise - 09/21/16 1021      Self-Care   Other Self-Care Comments  Orthotics     Knee/Hip Exercises: Surveyor, minerals Reformer see note      Kinesiotix   Facilitate Muscle  4 "I" strips for lateral ankle sprain   L side only      Ankle Exercises: Standing   SLS Added med sized ball for diagonal pattern (D1) while in SLS.    Rebounder foam pad single leg balance with catch/toss several trials each Leg (15-20 reps) occasional toe touch required.  Able to do Lt SLS on floor (gripper) consecutively without touching Rt. foot.            PT Education - 09/21/16 1208    Education provided Yes   Education Details ankle stability, alignment and custom orthotics   Person(s) Educated Patient   Methods Explanation   Comprehension Verbalized understanding          PT Short Term Goals - 09/21/16 1216      PT SHORT TERM GOAL #1   Title Pt will be I with HEP for bilateral LEs,  ankles    Status Achieved     PT SHORT TERM GOAL #2   Title Pt will be out of the Boot and walk without a limp (as MD allows) for short distances   Baseline when in clinic    Status Partially Met     PT SHORT TERM GOAL #3   Title Pt will report only min swelling end of the day after working.    Baseline improving    Status Partially Met     PT SHORT TERM GOAL #4   Title Pt will use RICE consistently at home for pain and swelling relief.    Status Achieved           PT Long Term Goals - 09/21/16 1217      PT LONG TERM GOAL #1   Title Pt will be I with concepts of RICE and HEP (more advanced)    Status On-going     PT LONG TERM GOAL #2   Title Pt will be able to work a full day with min pain, fatigue at the end of the day   Status On-going     PT LONG TERM GOAL #3   Title Pt will be able to show good balance control with SLS for min dynamic activities.    Baseline pain increases   Status Achieved     PT LONG  TERM GOAL #4   Title Pt will be able to squat without pain or tightness in post Rt. ankle.    Status On-going               Plan - 09/21/16 1030    Clinical Impression Statement Pt cont to have mild pain mostly in lateral aspect of L ankle.  Pain increases with plantarflexion and eversion.  Good single leg dynamic balance, Lt > Rt., although pain increases to moderate.    PT Next Visit Plan Recheck MMT Rt and L ankle. progress standing ankle activities, ice/tape post   PT Home Exercise Plan ankle DF/gastroc stretching, eversion green band for both ankles , standing heel raises?    Consulted and Agree with Plan of Care Patient      Patient will benefit from skilled therapeutic intervention in order to improve the following deficits and impairments:  Difficulty walking, Pain, Impaired flexibility, Decreased balance, Decreased mobility, Decreased strength, Increased edema, Impaired sensation  Visit Diagnosis: Pain in left ankle and joints of left foot  Pain in right ankle and joints of right foot  Difficulty walking  Localized edema     Problem List Patient Active Problem List   Diagnosis Date Noted  . Bell's palsy   . UTI (urinary tract infection) 06/27/2013  . GERD (gastroesophageal reflux disease) 06/27/2013  . ADD (attention deficit disorder) 06/27/2013  . Migraines     Fenris Cauble 09/21/2016, 12:18 PM  [facility] [address] [place], [place], [address] Phone: [phone]   Fax:  [phone]  Name: Tracey Malone MRN: [id number] Date of Birth: [birth year]   Raeford Razor, PT 09/21/16 12:24 PM Phone: [phone] Fax: [phone]

## 2016-09-23 ENCOUNTER — Ambulatory Visit
Admission: RE | Admit: 2016-09-23 | Discharge: 2016-09-23 | Disposition: A | Discharge status: Home / Self Care | Source: Ambulatory Visit | Attending: Physician Assistant | Admitting: Physician Assistant

## 2016-09-23 DIAGNOSIS — M542 Cervicalgia: Secondary | ICD-10-CM

## 2016-09-24 ENCOUNTER — Ambulatory Visit: Admitting: Physical Therapy

## 2016-09-24 DIAGNOSIS — M25572 Pain in left ankle and joints of left foot: Secondary | ICD-10-CM | POA: Diagnosis not present

## 2016-09-24 DIAGNOSIS — R600 Localized edema: Secondary | ICD-10-CM

## 2016-09-24 DIAGNOSIS — R262 Difficulty in walking, not elsewhere classified: Secondary | ICD-10-CM

## 2016-09-24 DIAGNOSIS — M25571 Pain in right ankle and joints of right foot: Secondary | ICD-10-CM

## 2016-09-24 NOTE — Therapy (Signed)
[place], [place], [address] Phone: [phone]   Fax:  [phone]  Physical Therapy Treatment  Patient Details  Name: Tracey Malone MRN: [id number] Date of Birth: [birth year] Referring Provider: Dr. Jean Rosenthal   Encounter Date: 09/24/2016      PT End of Session - 09/24/16 1151    Visit Number 5   Number of Visits 16   Date for PT Re-Evaluation 11/01/16   PT Start Time 1100   PT Stop Time 1155   PT Time Calculation (min) 55 min      Past Medical History:  Diagnosis Date  . Bell's palsy   . Migraines     Past Surgical History:  Procedure Laterality Date  . HEMORRHOID SURGERY    . INCONTINENCE SURGERY    . TUBAL LIGATION      There were no vitals filed for this visit.      Subjective Assessment - 09/24/16 1114    Currently in Pain? Yes   Pain Score 3    Pain Location Ankle   Pain Orientation Right;Left   Pain Descriptors / Indicators Aching   Aggravating Factors  PF/EV   Pain Relieving Factors meds, brace, tape, ice             [facility] PT Assessment - 09/24/16 0001      Strength   Right Ankle Plantar Flexion --  25 single heel raises    Left Ankle Plantar Flexion --  25 single heel raises                      [facility] Adult PT Treatment/Exercise - 09/24/16 0001      Self-Care   Other Self-Care Comments  Avoid PF/Ev combination movements to prevent peroneal tendon subluxation, Try prevention icing 2-3 times per day for edema management of right ankle      Modalities   Modalities Cryotherapy     Cryotherapy   Number Minutes Cryotherapy 10 Minutes   Cryotherapy Location Ankle  bilateral   Type of Cryotherapy Ice pack     Kinesiotix   Facilitate Muscle  Peroneal tendon tape Left, Edema tape right lateral ankle      Ankle Exercises: Standing   Rebounder foam pad single leg balance with catch/toss several trials each Leg (15-20 reps) occasional  toe touch required.  Able to do Lt SLS on floor (gripper) consecutively without touching Rt. foot.     Heel Raises --  25 single each leg    Other Standing Ankle Exercises Bosu step ups on round side x 15 each , no UE support, Flat side narrow and wide balance, weight shifting, squats x 15     Ankle Exercises: Stretches   Slant Board Stretch 60 seconds                  PT Short Term Goals - 09/21/16 1216      PT SHORT TERM GOAL #1   Title Pt will be I with HEP for bilateral LEs, ankles    Status Achieved     PT SHORT TERM GOAL #2   Title Pt will be out of the Boot and walk without a limp (as MD allows) for short distances   Baseline when in clinic    Status Partially Met     PT SHORT TERM GOAL #3   Title Pt will report only min swelling end of the day after working.  Baseline improving    Status Partially Met     PT SHORT TERM GOAL #4   Title Pt will use RICE consistently at home for pain and swelling relief.    Status Achieved           PT Long Term Goals - 09/21/16 1217      PT LONG TERM GOAL #1   Title Pt will be I with concepts of RICE and HEP (more advanced)    Status On-going     PT LONG TERM GOAL #2   Title Pt will be able to work a full day with min pain, fatigue at the end of the day   Status On-going     PT LONG TERM GOAL #3   Title Pt will be able to show good balance control with SLS for min dynamic activities.    Baseline pain increases   Status Achieved     PT LONG TERM GOAL #4   Title Pt will be able to squat without pain or tightness in post Rt. ankle.    Status On-going               Plan - 09/24/16 1151    Clinical Impression Statement Continued ankle stabilization exercises without increased pain. Pt presents picture of edema on right ankle from previous evening. Trial of KT tape for right ankle edema. Continued peroneal tendon popping out of grove with PF and EV motions. Discussed avoiding this combination of movement to  prevent frequent subluxing,    PT Next Visit Plan Recheck MMT Rt and L ankle. progress standing ankle activities, ice/tape post   PT Home Exercise Plan assess tape ; ankle DF/gastroc stretching, eversion green band for both ankles , standing heel raises?    Consulted and Agree with Plan of Care Patient      Patient will benefit from skilled therapeutic intervention in order to improve the following deficits and impairments:  Difficulty walking, Pain, Impaired flexibility, Decreased balance, Decreased mobility, Decreased strength, Increased edema, Impaired sensation  Visit Diagnosis: Pain in left ankle and joints of left foot  Pain in right ankle and joints of right foot  Difficulty walking  Localized edema     Problem List Patient Active Problem List   Diagnosis Date Noted  . Bell's palsy   . UTI (urinary tract infection) 06/27/2013  . GERD (gastroesophageal reflux disease) 06/27/2013  . ADD (attention deficit disorder) 06/27/2013  . Migraines     Dorene Ar, [place] 09/24/2016, 12:03 PM  [place], [place], [address] Phone: [phone]   Fax:  [phone]  Name: LASHAWNA POCHE MRN: [id number] Date of Birth: 09/24/1976

## 2016-09-28 ENCOUNTER — Ambulatory Visit: Admitting: Physical Therapy

## 2016-09-28 DIAGNOSIS — R600 Localized edema: Secondary | ICD-10-CM

## 2016-09-28 DIAGNOSIS — M25572 Pain in left ankle and joints of left foot: Secondary | ICD-10-CM | POA: Diagnosis not present

## 2016-09-28 DIAGNOSIS — R262 Difficulty in walking, not elsewhere classified: Secondary | ICD-10-CM

## 2016-09-28 DIAGNOSIS — M25571 Pain in right ankle and joints of right foot: Secondary | ICD-10-CM

## 2016-09-28 NOTE — Therapy (Signed)
[place], [place], [address] Phone: [phone]   Fax:  [phone]  Physical Therapy Treatment  Patient Details  Name: Tracey Malone MRN: [id number] Date of Birth: December 19, 1975 Referring Provider: Dr. Jean Rosenthal   Encounter Date: 09/28/2016      PT End of Session - 09/28/16 1110    Visit Number 6   Number of Visits 16   Date for PT Re-Evaluation 11/01/16   PT Start Time 1013   PT Stop Time 1056   PT Time Calculation (min) 43 min   Activity Tolerance Patient tolerated treatment well   Behavior During Therapy [facility] for tasks assessed/performed      Past Medical History:  Diagnosis Date  . Bell's palsy   . Migraines     Past Surgical History:  Procedure Laterality Date  . HEMORRHOID SURGERY    . INCONTINENCE SURGERY    . TUBAL LIGATION      There were no vitals filed for this visit.      Subjective Assessment - 09/28/16 1012    Subjective The L one has been hurting really bad.  Swelling in Rt. ankle only at night.  Tape helped. My MD appt was rescheduled for 1/8.     Currently in Pain? Yes   Pain Location Ankle   Pain Orientation Right;Left   Pain Descriptors / Indicators Tightness  cold numby feeling in L    Pain Type Chronic pain   Pain Onset More than a month ago   Pain Frequency Intermittent   Aggravating Factors  PF/EV   Pain Relieving Factors meds, brace ice tape            [facility] PT Assessment - 09/28/16 1014      Strength   Right Ankle Dorsiflexion 5/5   Right Ankle Plantar Flexion 5/5   Right Ankle Inversion 4+/5   Right Ankle Eversion 4/5  pain    Left Ankle Dorsiflexion 5/5   Left Ankle Plantar Flexion 4+/5   Left Ankle Inversion 4+/5   Left Ankle Eversion 4+/5                     [facility] Adult PT Treatment/Exercise - 09/28/16 1016      Knee/Hip Exercises: Machines for Strengthening   Total Gym Leg Press Leg press 65 lbs parallel,  narrow , Hip EV and INV.  Single leg press 45 lbs x 10 each leg      Kinesiotix   Facilitate Muscle  4 "I" strips for lateral ankle sprain   L side only    Ligament Correction edema 2 fans on Rt.      Ankle Exercises: Aerobic   Elliptical level 10 ramp, level 4 resist.       Ankle Exercises: Standing   SLS on BOSU with controlled balance and weight shifting   wide squat 1 foot on BOSU    Heel Raises 20 reps;Other (comment)  ball between ankles    Other Standing Ankle Exercises squats on foam 2 x 10 , BOSU hold and squat, single leg balance                 PT Education - 09/28/16 1110    Education provided No          PT Short Term Goals - 09/28/16 1115      PT SHORT TERM GOAL #1   Title Pt will be I with HEP for bilateral LEs, ankles  Status Achieved     PT SHORT TERM GOAL #2   Title Pt will be out of the Boot and walk without a limp (as MD allows) for short distances   Baseline when in clinic    Status Partially Met     PT SHORT TERM GOAL #3   Title Pt will report only min swelling end of the day after working.    Status Partially Met     PT SHORT TERM GOAL #4   Title Pt will use RICE consistently at home for pain and swelling relief.    Status Achieved           PT Long Term Goals - 09/28/16 1115      PT LONG TERM GOAL #1   Title Pt will be I with concepts of RICE and HEP (more advanced)    Status On-going     PT LONG TERM GOAL #2   Title Pt will be able to work a full day with min pain, fatigue at the end of the day   Status On-going     PT LONG TERM GOAL #3   Title Pt will be able to show good balance control with SLS for min dynamic activities.    Status Achieved     PT LONG TERM GOAL #4   Title Pt will be able to squat without pain or tightness in post Rt. ankle.    Status On-going               Plan - 09/28/16 1111    Clinical Impression Statement Pt reports increased strain in L peroneals with SLS activity on compliant  surface.  She has decent strength in ankle when measured bilaterally.  Understands positioning to avoid pain in ankles.     PT Next Visit Plan proprioception and balance,  progress standing ankle activities, ice/tape post   PT Home Exercise Plan ankle DF/gastroc stretching, eversion green band for both ankles , standing heel raises   Consulted and Agree with Plan of Care Patient      Patient will benefit from skilled therapeutic intervention in order to improve the following deficits and impairments:  Difficulty walking, Pain, Impaired flexibility, Decreased balance, Decreased mobility, Decreased strength, Increased edema, Impaired sensation  Visit Diagnosis: Pain in right ankle and joints of right foot  Pain in left ankle and joints of left foot  Difficulty walking  Localized edema     Problem List Patient Active Problem List   Diagnosis Date Noted  . Bell's palsy   . UTI (urinary tract infection) 06/27/2013  . GERD (gastroesophageal reflux disease) 06/27/2013  . ADD (attention deficit disorder) 06/27/2013  . Migraines     Massiah Longanecker 09/28/2016, 11:36 AM  [facility] [address] [place], [place], [address] Phone: [phone]   Fax:  [phone]  Name: Tracey Malone MRN: [id number] Date of Birth: May 08, 1976   Raeford Razor, PT 09/28/16 11:36 AM Phone: [phone] Fax: [phone]

## 2016-09-30 ENCOUNTER — Ambulatory Visit: Admitting: Physical Therapy

## 2016-09-30 ENCOUNTER — Ambulatory Visit (INDEPENDENT_AMBULATORY_CARE_PROVIDER_SITE_OTHER): Admitting: Orthopaedic Surgery

## 2016-10-04 ENCOUNTER — Encounter: Payer: Self-pay | Admitting: Physician Assistant

## 2016-10-05 ENCOUNTER — Ambulatory Visit: Admitting: Physical Therapy

## 2016-10-06 MED ORDER — TRAMADOL HCL 50 MG PO TABS: 50.0000 mg | ORAL_TABLET | Freq: Three times a day (TID) | ORAL | 0 refills | Status: DC | PRN

## 2016-10-08 ENCOUNTER — Ambulatory Visit: Attending: Orthopaedic Surgery | Admitting: Physical Therapy

## 2016-10-08 DIAGNOSIS — M542 Cervicalgia: Secondary | ICD-10-CM | POA: Insufficient documentation

## 2016-10-08 DIAGNOSIS — R262 Difficulty in walking, not elsewhere classified: Secondary | ICD-10-CM | POA: Diagnosis present

## 2016-10-08 DIAGNOSIS — M6281 Muscle weakness (generalized): Secondary | ICD-10-CM | POA: Insufficient documentation

## 2016-10-08 DIAGNOSIS — M25572 Pain in left ankle and joints of left foot: Secondary | ICD-10-CM | POA: Insufficient documentation

## 2016-10-08 DIAGNOSIS — R293 Abnormal posture: Secondary | ICD-10-CM | POA: Insufficient documentation

## 2016-10-08 DIAGNOSIS — R600 Localized edema: Secondary | ICD-10-CM | POA: Diagnosis present

## 2016-10-08 DIAGNOSIS — M25571 Pain in right ankle and joints of right foot: Secondary | ICD-10-CM | POA: Insufficient documentation

## 2016-10-08 NOTE — Therapy (Addendum)
[place], [place], [address] Phone: [phone]   Fax:  [phone]  Physical Therapy Treatment/Discharge  Patient Details  Name: Tracey Malone MRN: [id number] Date of Birth: [birth year] Referring Provider: Dr. Jean Rosenthal   Encounter Date: 10/08/2016      PT End of Session - 10/08/16 1139    Visit Number 7   Number of Visits 16   Date for PT Re-Evaluation 11/01/16   PT Start Time 1100   PT Stop Time 1139   PT Time Calculation (min) 39 min      Past Medical History:  Diagnosis Date  . Bell's palsy   . Migraines     Past Surgical History:  Procedure Laterality Date  . HEMORRHOID SURGERY    . INCONTINENCE SURGERY    . TUBAL LIGATION      There were no vitals filed for this visit.      Subjective Assessment - 10/08/16 1103    Subjective Right one is better. Left one still pops. I canceled twice due to having the flu.   Currently in Pain? Yes   Pain Score 3    Pain Location Ankle   Pain Orientation Left   Aggravating Factors  PF/EV left    Pain Relieving Factors meds, brace ice                          [facility] Adult PT Treatment/Exercise - 10/08/16 0001      Ankle Exercises: Sidelying   Ankle Eversion 20 reps  bilat   Ankle Eversion Weights (lbs) 3     Ankle Exercises: Standing   Rebounder foam pad single leg balance with catch/toss several trials each Leg (15-20 reps) occasional toe touch required.  Able to do Lt SLS on floor (gripper) consecutively without touching Rt. foot.     Heel Raises 20 reps;Other (comment)  25 single each leg   Other Standing Ankle Exercises Squats narrow and wide, no pain    Other Standing Ankle Exercises Bosu step ups on round side x 15 each , no UE support, Flat side narrow and wide balance, weight shifting, squats x 15     Ankle Exercises: Stretches   Slant Board Stretch 60 seconds                  PT Short Term  Goals - 10/08/16 1107      PT SHORT TERM GOAL #1   Title Pt will be I with HEP for bilateral LEs, ankles    Time 4   Period Weeks   Status Achieved     PT SHORT TERM GOAL #2   Title Pt will be out of the Boot and walk without a limp (as MD allows) for short distances   Time 4   Period Weeks   Status Achieved     PT SHORT TERM GOAL #3   Title Pt will report only min swelling end of the day after working.    Baseline has been sick, so far swelling is better   Time 4   Period Weeks   Status Unable to assess     PT SHORT TERM GOAL #4   Title Pt will use RICE consistently at home for pain and swelling relief.    Status Achieved           PT Long Term Goals - 10/08/16 1107      PT LONG TERM  GOAL #1   Title Pt will be I with concepts of RICE and HEP (more advanced)    Time 8   Period Weeks   Status On-going     PT LONG TERM GOAL #2   Title Pt will be able to work a full day with min pain, fatigue at the end of the day   Baseline worked the last two days without increased pain, didnt walk as much due to weather   Time 8   Period Weeks   Status Achieved     PT LONG TERM GOAL #3   Title Pt will be able to show good balance control with SLS for min dynamic activities.    Status Achieved     PT LONG TERM GOAL #4   Title Pt will be able to squat without pain or tightness in post Rt. ankle.    Time 8   Period Weeks   Status Achieved               Plan - 10/08/16 1137    Clinical Impression Statement Pt reports no pain with therex today. She has noted decreased edema and pain in RLE however L ankle still has lateral pop with combined PF and EV motions. She has met most goals. She sees MD for follow up on ankles Monday. She will return here afterward. She is close to discharge. She may be interested in PT for her neck pain. See goals met. She declined tape or ice today.    PT Next Visit Plan proprioception and balance,  progress standing ankle activities, ice/tape  post   PT Home Exercise Plan ankle DF/gastroc stretching, eversion green band for both ankles , standing heel raises   Consulted and Agree with Plan of Care Patient      Patient will benefit from skilled therapeutic intervention in order to improve the following deficits and impairments:  Difficulty walking, Pain, Impaired flexibility, Decreased balance, Decreased mobility, Decreased strength, Increased edema, Impaired sensation  Visit Diagnosis: Pain in right ankle and joints of right foot  Pain in left ankle and joints of left foot  Difficulty walking  Localized edema     Problem List Patient Active Problem List   Diagnosis Date Noted  . Bell's palsy   . UTI (urinary tract infection) 06/27/2013  . GERD (gastroesophageal reflux disease) 06/27/2013  . ADD (attention deficit disorder) 06/27/2013  . Migraines     Dorene Ar, [place] 10/08/2016, 11:40 AM  [facility], [place], [address] Phone: [phone]   Fax:  [phone]  Name: Tracey Malone MRN: [id number] Date of Birth: [birth year]   PHYSICAL THERAPY DISCHARGE SUMMARY  Visits from Start of Care: 7  Current functional level related to goals / functional outcomes: See above for most recent info    Remaining deficits:Pain in ankle    Education / Equipment: HEP, posture, RICE  Plan: Patient agrees to discharge.  Patient goals were not met. Patient is being discharged due to being pleased with the current functional level.  ?????    Raeford Razor, PT 10/27/16 3:14 PM Phone: [phone] Fax: [phone]

## 2016-10-11 ENCOUNTER — Encounter (INDEPENDENT_AMBULATORY_CARE_PROVIDER_SITE_OTHER): Payer: Self-pay | Admitting: Physician Assistant

## 2016-10-11 ENCOUNTER — Ambulatory Visit (INDEPENDENT_AMBULATORY_CARE_PROVIDER_SITE_OTHER): Admitting: Physician Assistant

## 2016-10-11 DIAGNOSIS — M25571 Pain in right ankle and joints of right foot: Secondary | ICD-10-CM

## 2016-10-11 DIAGNOSIS — M25572 Pain in left ankle and joints of left foot: Secondary | ICD-10-CM

## 2016-10-11 NOTE — Progress Notes (Signed)
Office Visit Note   Patient: Tracey Malone           Date of Birth: Apr 17, 1976           MRN: [id number] Visit Date: 10/11/2016              Requested by: Tracey Brooks, MD [address] [facility] [address] [place], [place] [address] PCP: Tracey Grosser, MD   Assessment & Plan: Visit Diagnoses:  1. Pain in left ankle and joints of left foot     Plan: We will obtain an MRI of her left ankle to rule out left peroneal tendon tear. She has undergone formal therapy for at least 10 sessions, bracing and anti-inflammatories. Despite these conservative measures she continues to have daily chronic pain left lateral ankle that limits her daily activities.  Follow-Up Instructions: Return in about 2 weeks (around 10/25/2016) for after MRI left ankle.   Orders:  Orders Placed This Encounter  Procedures  . MR Ankle Left w/o contrast   No orders of the defined types were placed in this encounter.     Procedures: No procedures performed   Clinical Data: No additional findings.   Subjective: Chief Complaint  Patient presents with  . Right Ankle - Follow-up  . Left Ankle - Follow-up  . Follow-up    HPI Tracey Malone returns today follow-up bilateral ankle pain. She has left ankle pain lateral aspect with occasional painful popping with certain positions she states that she is unable to re-create it at will. She has had pain in the left ankle since June 2017, no actual injury at that time and just been doing a lot of painting. Regards to the right ankle she turned the ankle back in October continues to have pain in the anterior lateral aspect of the ankle but not to the degree that she has the left.  Review of Systems   Objective: Vital Signs: LMP 09/03/2016 (Exact Date)   Physical Exam  Ortho Exam Good range of motion of both ankles without pain. No weakness with internal and external rotation against resistance bilateral ankles. Tenderness over the left ankle peroneal tendon region  just posterior to the lateral malleolus. Left subtalar joint tenderness with palpation .Swelling of the right ankle globally with tenderness over the sinus Tarsi region. She is able to do heel raises bilaterally. Achilles are intact bilaterally. Dorsal pedal pulses are 2+ bilaterally sensation intact bilateral feet to light touch. No rashes skin lesions ulcerations impending ulcers of either foot or ankle. Specialty Comments:  No specialty comments available.  Imaging: No results found.   PMFS History: Patient Active Problem List   Diagnosis Date Noted  . Bell's palsy   . UTI (urinary tract infection) 06/27/2013  . GERD (gastroesophageal reflux disease) 06/27/2013  . ADD (attention deficit disorder) 06/27/2013  . Migraines    Past Medical History:  Diagnosis Date  . Bell's palsy   . Migraines     Family History  Problem Relation Age of Onset  . COPD Father     Past Surgical History:  Procedure Laterality Date  . HEMORRHOID SURGERY    . INCONTINENCE SURGERY    . TUBAL LIGATION     Social History   Occupational History  . Not on file.   Social History Main Topics  . Smoking status: Never Smoker  . Smokeless tobacco: Not on file  . Alcohol use No  . Drug use: No  . Sexual activity: Yes    Birth  control/ protection: Surgical     Comment: Married to Tracey Malone, two kids.  Retired from [employer].

## 2016-10-12 ENCOUNTER — Ambulatory Visit: Admitting: Physical Therapy

## 2016-10-12 ENCOUNTER — Other Ambulatory Visit (INDEPENDENT_AMBULATORY_CARE_PROVIDER_SITE_OTHER): Payer: Self-pay

## 2016-10-14 ENCOUNTER — Encounter: Payer: Self-pay | Admitting: Family Medicine

## 2016-10-14 ENCOUNTER — Ambulatory Visit (INDEPENDENT_AMBULATORY_CARE_PROVIDER_SITE_OTHER): Admitting: Family Medicine

## 2016-10-14 VITALS — BP 110/64 | HR 74 | Temp 98.2°F | Resp 14 | Ht 65.0 in | Wt 152.0 lb

## 2016-10-14 DIAGNOSIS — M542 Cervicalgia: Secondary | ICD-10-CM

## 2016-10-14 MED ORDER — DIAZEPAM 10 MG PO TABS: 10.0000 mg | ORAL_TABLET | Freq: Three times a day (TID) | ORAL | 0 refills | Status: DC | PRN

## 2016-10-14 MED ORDER — PREDNISONE 20 MG PO TABS: ORAL_TABLET | ORAL | 0 refills | Status: DC

## 2016-10-14 NOTE — Progress Notes (Signed)
Subjective:    Patient ID: Tracey Malone, female    DOB: 1976/02/08, 41 y.o.   MRN: [id number]  HPI Patient was seen in the first part of December with pain in the left side of her neck presented to be a muscle spasm. She was started on Skelaxin. Pain has not improved. In fact it is worsened. Pain is primarily over the left side of the trapezius muscle from the lateral side of the neck all the way to the shoulder. Pain is worse with right rotation of the neck. There is very minimal pain with left rotation of the neck. There is minimal pain with flexion of the neck. There is some tightness and pain with extension of the neck. She denies any numbness or tingling in her left arm. She denies any sudden weakness in her left arm. She is tender to palpation over the trapezius muscle. Past Medical History:  Diagnosis Date  . Bell's palsy   . Migraines    Past Surgical History:  Procedure Laterality Date  . HEMORRHOID SURGERY    . INCONTINENCE SURGERY    . TUBAL LIGATION     Current Outpatient Prescriptions on File Prior to Visit  Medication Sig Dispense Refill  . metaxalone (SKELAXIN) 800 MG tablet Take 1 tablet (800 mg total) by mouth 3 (three) times daily. 30 tablet 1  . traMADol (ULTRAM) 50 MG tablet Take 1-2 tablets (50-100 mg total) by mouth every 8 (eight) hours as needed. 60 tablet 0  . meloxicam (MOBIC) 15 MG tablet Take 1 tablet (15 mg total) by mouth daily. (Patient not taking: Reported on 10/14/2016) 30 tablet 0  . naproxen (NAPROSYN) 500 MG tablet     . phentermine 37.5 MG capsule once as needed.  1   No current facility-administered medications on file prior to visit.    Allergies  Allergen Reactions  . Codeine Shortness Of Breath  . Topamax [Topiramate]     Cognitive Slowing--types same word over and over. Cannot get out correct words when talking.  Colleen Can. Treximet [Sumatriptan-Naproxen Sodium] Shortness Of Breath and Swelling    Throat gets tight.  . Penicillins Swelling   Swelling unknown  Has patient had a PCN reaction causing immediate rash, facial/tongue/throat swelling, SOB or lightheadedness with hypotension: Unknown Has patient had a PCN reaction causing severe rash involving mucus membranes or skin necrosis: No  Has patient had a PCN reaction that required hospitalization: No  Has patient had a PCN reaction occurring within the last 10 years: No  If all of the above answers are "NO", then may proceed with Cep   Social History   Social History  . Marital status: Married    Spouse name: N/A  . Number of children: N/A  . Years of education: N/A   Occupational History  . Not on file.   Social History Main Topics  . Smoking status: Never Smoker  . Smokeless tobacco: Not on file  . Alcohol use No  . Drug use: No  . Sexual activity: Yes    Birth control/ protection: Surgical     Comment: Married to [employer]im, two kids.  Retired from [employer].   Other Topics Concern  . Not on file   Social History Narrative  . No narrative on file      Review of Systems  All other systems reviewed and are negative.      Objective:   Physical Exam  Constitutional: She appears well-developed and well-nourished.  Cardiovascular: Normal rate, regular  rhythm and normal heart sounds.   Pulmonary/Chest: Effort normal and breath sounds normal.  Musculoskeletal:       Cervical back: She exhibits decreased range of motion, tenderness, pain and spasm.  Vitals reviewed.         Assessment & Plan:  Neck pain - Plan: Ambulatory referral to Physical Therapy  Neck pain on left side - Plan: Ambulatory referral to Physical Therapy  Patient has a strain in her left trapezius refractory to conservative measures. Discontinue Skelaxin and meloxicam. Begin prednisone taper pack in addition to Valium 5-10 mg every 8 hours as needed for muscle spasms. Use heat 2-3 times a day and consult physical therapy

## 2016-10-15 ENCOUNTER — Ambulatory Visit: Admitting: Physical Therapy

## 2016-10-20 ENCOUNTER — Other Ambulatory Visit

## 2016-10-21 ENCOUNTER — Ambulatory Visit: Admitting: Physical Therapy

## 2016-10-22 ENCOUNTER — Ambulatory Visit
Admission: RE | Admit: 2016-10-22 | Discharge: 2016-10-22 | Disposition: A | Discharge status: Home / Self Care | Source: Ambulatory Visit | Attending: Physician Assistant | Admitting: Physician Assistant

## 2016-10-22 DIAGNOSIS — M25572 Pain in left ankle and joints of left foot: Secondary | ICD-10-CM

## 2016-10-25 ENCOUNTER — Ambulatory Visit (INDEPENDENT_AMBULATORY_CARE_PROVIDER_SITE_OTHER): Admitting: Physician Assistant

## 2016-10-25 DIAGNOSIS — M25572 Pain in left ankle and joints of left foot: Secondary | ICD-10-CM

## 2016-10-25 DIAGNOSIS — M7672 Peroneal tendinitis, left leg: Secondary | ICD-10-CM | POA: Diagnosis not present

## 2016-10-25 MED ORDER — LIDOCAINE HCL 1 % IJ SOLN
0.5000 mL | INTRAMUSCULAR | Status: AC | PRN
  Administered 2016-10-25: .5 mL

## 2016-10-25 MED ORDER — METHYLPREDNISOLONE ACETATE 40 MG/ML IJ SUSP
20.0000 mg | INTRAMUSCULAR | Status: AC | PRN
  Administered 2016-10-25: 20 mg via INTRA_ARTICULAR

## 2016-10-25 NOTE — Progress Notes (Signed)
   Office Visit Note   Patient: Tracey Malone           Date of Birth: [birth year]           MRN: [id number] Visit Date: 10/25/2016              Requested by: Donita BrooksWarren T Pickard, MD [address] [facility] [address] [place], [place] [id number] PCP: Leo GrosserPICKARD,WARREN TOM, MD   Assessment & Plan: Visit Diagnoses:  1. Pain in left ankle and joints of left foot   2. Sinus tarsi syndrome of left ankle   3. Peroneal tendinitis of left lower extremity     Plan:  Continue exercises as shown by physical therapy. Talked with her about shoewear and picking up a over-the-counter insert offload her lateral column. Gastrocsoleus stretching exercises shown to the patient. Follow up knee pain as needed.  Follow-Up Instructions: Return if symptoms worsen or fail to improve.   Orders:  Orders Placed This Encounter  Procedures  . Medium Joint Injection/Arthrocentesis  . Small Joint Injection/Arthrocentesis   No orders of the defined types were placed in this encounter.     Procedures: Small Joint Inj Date/Time: 10/25/2016 3:11 PM Performed by: Kirtland BouchardLARK, Aloysious Vangieson W Authorized by: Kirtland BouchardLARK, Marigny Borre W   Location:  Foot Site:  L subtalar Approach:  Dorsal Ultrasound Guided: No   Fluoroscopic Guidance: No   Medications:  20 mg methylPREDNISolone acetate 40 MG/ML; 0.5 mL lidocaine 1 %     Clinical Data: No additional findings.   Subjective: No chief complaint on file.   HPI Tracey Malone returns today follow-up status post MRI of her left ankle. She is now having more pain in the posterior aspect of the and the anterior aspect of the ankle. MRI results reviewed with the patient no peroneal tendon tear. Left peroneus brevis and longus mild tendinosis. Subtalar joint normal. Otherwise normal MRI left ankle. Review of Systems   Objective: Vital Signs: There were no vitals taken for this visit.  Physical Exam  Ortho Exam Tenderness over the left sinus Tarsi region nontender of the peroneal tendons today 5  out of 5 strengths with inversion eversion against resistance . Left Achilles intact tenderness at the Achilles insertion. Specialty Comments:  No specialty comments available.  Imaging: No results found.   PMFS History: Patient Active Problem List   Diagnosis Date Noted  . Bell's palsy   . UTI (urinary tract infection) 06/27/2013  . GERD (gastroesophageal reflux disease) 06/27/2013  . ADD (attention deficit disorder) 06/27/2013  . Migraines    Past Medical History:  Diagnosis Date  . Bell's palsy   . Migraines     Family History  Problem Relation Age of Onset  . COPD Father     Past Surgical History:  Procedure Laterality Date  . HEMORRHOID SURGERY    . INCONTINENCE SURGERY    . TUBAL LIGATION     Social History   Occupational History  . Not on file.   Social History Main Topics  . Smoking status: Never Smoker  . Smokeless tobacco: Not on file  . Alcohol use No  . Drug use: No  . Sexual activity: Yes    Birth control/ protection: Surgical     Comment: Married to [employer], two kids.  Retired from [employer].

## 2016-10-26 ENCOUNTER — Ambulatory Visit: Admitting: Physical Therapy

## 2016-10-26 DIAGNOSIS — M25571 Pain in right ankle and joints of right foot: Secondary | ICD-10-CM | POA: Diagnosis not present

## 2016-10-26 DIAGNOSIS — M6281 Muscle weakness (generalized): Secondary | ICD-10-CM

## 2016-10-26 DIAGNOSIS — R293 Abnormal posture: Secondary | ICD-10-CM

## 2016-10-26 DIAGNOSIS — M542 Cervicalgia: Secondary | ICD-10-CM

## 2016-10-26 NOTE — Therapy (Signed)
[facility] [address] [place], [place], [address] Phone: [phone]   Fax:  [phone]  Physical Therapy Evaluation  Patient Details  Name: Tracey Malone MRN: [id number] Date of Birth: [birth year] Referring Provider: Dr. Lynnea Ferrier   Encounter Date: 10/26/2016      PT End of Session - 10/26/16 1552    Visit Number 1   Number of Visits 8   Date for PT Re-Evaluation 11/23/16   PT Start Time 1500   PT Stop Time 1549   PT Time Calculation (min) 49 min   Activity Tolerance Patient tolerated treatment well   Behavior During Therapy [facility] for tasks assessed/performed      Past Medical History:  Diagnosis Date  . Bell's palsy   . Migraines     Past Surgical History:  Procedure Laterality Date  . HEMORRHOID SURGERY    . INCONTINENCE SURGERY    . TUBAL LIGATION      There were no vitals filed for this visit.       Subjective Assessment - 10/26/16 1505    Subjective Woke Dec. 5 with neck pain.  Increased after having her hair done.  She continues to have stiffness and pain with cervical AROM.  Taking Valium for muscle spasms which has helped.  She denies weakness and radiating pain into her hand and arm.     Limitations Lifting;House hold activities;Other (comment)  sleeping is difficult   Currently in Pain? Yes   Pain Score 6    Pain Location Neck   Pain Orientation Left;Lateral   Pain Descriptors / Indicators Tightness;Spasm   Pain Type Chronic pain   Pain Onset More than a month ago   Pain Frequency Intermittent   Aggravating Factors  looking up and back to L    Pain Relieving Factors rest, Valium    Effect of Pain on Daily Activities being comfortable and being able to turn head             [facility] PT Assessment - 10/26/16 1516      Assessment   Medical Diagnosis neck pain    Referring Provider Dr. Lynnea Ferrier    Onset Date/Surgical Date 09/07/16   Prior Therapy not for neck      Precautions   Precautions None     Restrictions   Weight Bearing Restrictions No     Balance Screen   Has the patient fallen in the past 6 months No     Home Environment   Living Environment Private residence   Living Arrangements Spouse/significant other;Children   Type of Home House   Home Access Stairs to enter   Home Layout Multi-level     Prior Function   Level of Independence Independent   Vocation Full time employment   Vocation Requirements walking, also office work      Cognition   Overall Cognitive Status Within Functional Limits for tasks assessed     Observation/Other Assessments   Focus on Therapeutic Outcomes (FOTO)  42%     Sensation   Light Touch Appears Intact     Posture/Postural Control   Posture/Postural Control Postural limitations   Postural Limitations Rounded Shoulders;Forward head     AROM   Right/Left Shoulder --  WNL    Cervical Flexion 60  20 deg capital flexion    Cervical Extension 25   Cervical - Right Side Bend 50   Cervical - Left Side Bend 25   Cervical - Right Rotation 68  Cervical - Left Rotation 60      Strength   Right Shoulder Flexion 5/5   Right Shoulder ABduction 5/5   Left Shoulder Flexion 4/5   Left Shoulder ABduction 4+/5   Right Elbow Flexion 5/5   Right Elbow Extension 5/5   Left Elbow Flexion 4/5   Left Elbow Extension 5/5     Palpation   Spinal mobility pain at C6-C7, normal mobility    Palpation comment muscular pain in L upper trap, L levator scap, no pain in suboccipitals                    [facility] Adult PT Treatment/Exercise - 10/26/16 1516      Neck Exercises: Seated   Neck Retraction 5 reps   Postural Training self care, used mirror for feedback      Modalities   Modalities Moist Heat     Moist Heat Therapy   Number Minutes Moist Heat 10 Minutes   Moist Heat Location Shoulder;Cervical     Manual Therapy   Manual Therapy Soft tissue mobilization;Myofascial release   Soft tissue  mobilization L upper trap and levator, trigger pt    Myofascial Release L cervical      Neck Exercises: Stretches   Upper Trapezius Stretch 3 reps;30 seconds   Upper Trapezius Stretch Limitations each side    Levator Stretch 3 reps;30 seconds   Levator Stretch Limitations each side                 PT Education - 10/26/16 1537    Education provided Yes   Education Details HEP, PT, POC , posture    Person(s) Educated Patient   Methods Explanation;Demonstration;Handout;Verbal cues   Comprehension Verbalized understanding;Returned demonstration;Need further instruction            PT Long Term Goals - 10/26/16 1601      PT LONG TERM GOAL #1   Title Pt will be I with concepts of posture and body mechanics, lifting.    Time 4   Period Weeks   Status New     PT LONG TERM GOAL #2   Title Pt will have full AROM of cervical spine without pain.    Time 4   Period Weeks   Status New     PT LONG TERM GOAL #3   Title Pt will be able to demo 5/5 strength in LUE for full function.    Time 4   Period Weeks   Status New     PT LONG TERM GOAL #4   Title Pt will be able to sleep without waking from pain, modified position.    Time 4   Period Weeks   Status New               Plan - 10/26/16 1553    Clinical Impression Statement Patient presents for low complexity eval of neck pain which has been going on for almost 2 mos.  ROM and pain is improving but cont to have decreased comfort at rest and difficulty turning head., looking down. She does have min weakness in L UE which may be due to avoiding use with that arm.    Rehab Potential Excellent   PT Frequency 2x / week   PT Duration 8 weeks   PT Treatment/Interventions ADLs/Self Care Home Management;Therapeutic activities;Therapeutic exercise;Ultrasound;Manual techniques;Taping;Neuromuscular re-education;Cryotherapy;Electrical Stimulation;Iontophoresis 4mg /ml Dexamethasone;Functional mobility training;Patient/family  education;Vasopneumatic Device;Passive range of motion;Moist Heat;Dry needling   PT Next Visit Plan check neck tension  stretches, manual and DN , posture   PT Home Exercise Plan chin tuck, levator scap, upper trap stretch and scap retraction    Consulted and Agree with Plan of Care Patient      Patient will benefit from skilled therapeutic intervention in order to improve the following deficits and impairments:  Difficulty walking, Pain, Impaired flexibility, Decreased balance, Decreased mobility, Decreased strength, Impaired sensation, Increased fascial restricitons, Decreased range of motion, Increased muscle spasms, Impaired UE functional use, Postural dysfunction  Visit Diagnosis: Cervicalgia  Abnormal posture  Muscle weakness (generalized)      G-Codes - 10-29-16 1604    Functional Assessment Tool Used FOTO   Functional Limitation Mobility: Walking and moving around   Mobility: Walking and Moving Around Current Status [phone]) At least 1 percent but less than 20 percent impaired, limited or restricted   Mobility: Walking and Moving Around Goal Status [phone]) At least 1 percent but less than 20 percent impaired, limited or restricted   Mobility: Walking and Moving Around Discharge Status [phone]) At least 1 percent but less than 20 percent impaired, limited or restricted   Changing and Maintaining Body Position Current Status (O1308) At least 40 percent but less than 60 percent impaired, limited or restricted   Changing and Maintaining Body Position Goal Status (M5784) At least 20 percent but less than 40 percent impaired, limited or restricted       Problem List Patient Active Problem List   Diagnosis Date Noted  . Bell's palsy   . UTI (urinary tract infection) 06/27/2013  . GERD (gastroesophageal reflux disease) 06/27/2013  . ADD (attention deficit disorder) 06/27/2013  . Migraines     Tracey Malone 2016/10/29, 4:10 PM  [facility] [address] [place], [place], [address] Phone: [phone]   Fax:  [phone]  Name: Tracey Malone MRN: [id number] Date of Birth: 1976-02-18    Karie Mainland, PT Oct 29, 2016 4:10 PM Phone: [phone] Fax: [phone]

## 2016-10-26 NOTE — Patient Instructions (Addendum)
Levator Stretch   Grasp seat or sit on hand on side to be stretched. Turn head toward other side and look down. Use hand on head to gently stretch neck in that position. Hold __30__ seconds. Repeat on other side. Repeat ___2-3_ times. Do ___2_ sessions per day.  http://gt2.exer.us/30   Copyright  VHI. All rights reserved.  Side-Bending   One hand on opposite side of head, pull head to side as far as is comfortable. Stop if there is pain. Hold __30__ seconds. Repeat with other hand to other side. Repeat _2-3___ times. Do ___2_ sessions per day.   Copyright  VHI. All rights reserved.  Scapular Retraction (Standing)   With arms at sides, pinch shoulder blades together. Repeat _10___ times per set. Do ___2_ sets per session. Do ___2_ sessions per day.  http://orth.exer.us/944   Axial Extension (Chin Tuck)    Pull chin in and lengthen back of neck. Hold _10___ seconds  Repeat __5-10__ times. Do _2__ sessions per day.  http://gt2.exer.us/450   Copyright  VHI. All rights reserved.

## 2016-10-27 ENCOUNTER — Encounter: Payer: Self-pay | Admitting: Family Medicine

## 2016-11-01 ENCOUNTER — Ambulatory Visit: Admitting: Physical Therapy

## 2016-11-01 DIAGNOSIS — M25571 Pain in right ankle and joints of right foot: Secondary | ICD-10-CM | POA: Diagnosis not present

## 2016-11-01 DIAGNOSIS — M6281 Muscle weakness (generalized): Secondary | ICD-10-CM

## 2016-11-01 DIAGNOSIS — M542 Cervicalgia: Secondary | ICD-10-CM

## 2016-11-01 DIAGNOSIS — R293 Abnormal posture: Secondary | ICD-10-CM

## 2016-11-01 NOTE — Therapy (Signed)
[facility] [address] [place], [place], [address] Phone: [phone]   Fax:  [phone]  Physical Therapy Treatment  Patient Details  Name: Tracey HiltsMichelle I Malone MRN: [id number] Date of Birth: 1976/08/25 Referring Provider: Dr. Lynnea FerrierWarren Pickard   Encounter Date: 11/01/2016      PT End of Session - 11/01/16 1456    Visit Number 2   Number of Visits 8   Date for PT Re-Evaluation 11/23/16   PT Start Time 1500   PT Stop Time 1552   PT Time Calculation (min) 52 min   Activity Tolerance Patient tolerated treatment well   Behavior [facility] tasks assessed/performed      Past Medical History:  Diagnosis Date  . Bell's palsy   . Migraines     Past Surgical History:  Procedure Laterality Date  . HEMORRHOID SURGERY    . INCONTINENCE SURGERY    . TUBAL LIGATION      There were no vitals filed for this visit.      Subjective Assessment - 11/01/16 1504    Subjective "Things are going pretty good"    Currently in Pain? Yes   Pain Score 2    Pain Location Neck   Pain Orientation Left   Pain Descriptors / Indicators Tightness;Spasm   Pain Type Chronic pain   Pain Onset More than a month ago   Pain Frequency Intermittent   Aggravating Factors  Looking up and back to the L   Pain Relieving Factors rest, Valium                         [facility] Adult PT Treatment/Exercise - 11/01/16 0001      Neck Exercises: Seated   Other Seated Exercise upper cervical rotaiton x 5 using 4 fingers, then progressing to 3 fingers with 5 reps, and 2 with 5 reps   verbal cues for form, added to HEP     Manual Therapy   Manual Therapy Taping;Joint mobilization;Passive ROM   Joint Mobilization T1-T6 PA grade 3 mobs, cervical gapping mobilizations to the L from C3-C7 grade 3   Soft tissue mobilization IASTM over L upper trap/ levator scapulae   Myofascial Release stretching/ rolling over the L upper trap/ levator  scapulae   Mulligan trial of upper trap inhibition taping on the L only     Neck Exercises: Stretches   Upper Trapezius Stretch 3 reps;30 seconds  contract/ relax with 10 sec contraction          Trigger Point Dry Needling - 11/01/16 1550    Consent Given? Yes   Education Handout Provided Yes   Muscles Treated Upper Body Upper trapezius   Upper Trapezius Response Twitch reponse elicited;Palpable increased muscle length  L only              PT Education - 11/01/16 1455    Education provided Yes   Education Details anatomy regrading the formation of trigger points. what TPDN is and benefits, what to expect and after care. updated HEP for upper cervical mobility with flexion   Person(s) Educated Patient   Methods Explanation;Verbal cues;Handout   Comprehension Verbalized understanding;Verbal cues required          PT Short Term Goals - 10/08/16 1107      PT SHORT TERM GOAL #1   Title Pt will be I with HEP for bilateral LEs, ankles    Time 4   Period Weeks  Status Achieved     PT SHORT TERM GOAL #2   Title Pt will be out of the Boot and walk without a limp (as MD allows) for short distances   Time 4   Period Weeks   Status Achieved     PT SHORT TERM GOAL #3   Title Pt will report only min swelling end of the day after working.    Baseline has been sick, so far swelling is better   Time 4   Period Weeks   Status Unable to assess     PT SHORT TERM GOAL #4   Title Pt will use RICE consistently at home for pain and swelling relief.    Status Achieved           PT Long Term Goals - 10/26/16 1601      PT LONG TERM GOAL #1   Title Pt will be I with concepts of posture and body mechanics, lifting.    Time 4   Period Weeks   Status New     PT LONG TERM GOAL #2   Title Pt will have full AROM of cervical spine without pain.    Time 4   Period Weeks   Status New     PT LONG TERM GOAL #3   Title Pt will be able to demo 5/5 strength in LUE for full  function.    Time 4   Period Weeks   Status New     PT LONG TERM GOAL #4   Title Pt will be able to sleep without waking from pain, modified position.    Time 4   Period Weeks   Status New               Plan - 11/01/16 1547    Clinical Impression Statement pt reports decreased pain today compared to previous session at 2/10 today.  DN was educated and performed on the L upper trap, followed soft tissue work post DN. following thoracic/ cerivcal mobs and upper trap stretching she reported decreased pain and tightness. utilized MHP post session for DN soreness.    PT Next Visit Plan assess response to DN, check neck tension stretches, manual , posture   PT Home Exercise Plan chin tuck, levator scap, upper trap stretch and scap retraction, upper cervical rotations   Consulted and Agree with Plan of Care Patient      Patient will benefit from skilled therapeutic intervention in order to improve the following deficits and impairments:  Difficulty walking, Pain, Impaired flexibility, Decreased balance, Decreased mobility, Decreased strength, Impaired sensation, Increased fascial restricitons, Decreased range of motion, Increased muscle spasms, Impaired UE functional use, Postural dysfunction  Visit Diagnosis: Cervicalgia  Abnormal posture  Muscle weakness (generalized)     Problem List Patient Active Problem List   Diagnosis Date Noted  . Bell's palsy   . UTI (urinary tract infection) 06/27/2013  . GERD (gastroesophageal reflux disease) 06/27/2013  . ADD (attention deficit disorder) 06/27/2013  . Migraines    Lowen Barringer PT, DPT, LAT, ATC  11/01/16  4:00 PM      [facility] [address] [place], [place], [address] Phone: [phone]   Fax:  [phone]  Name: Tracey Malone MRN: [id number] Date of Birth: 1976-04-12

## 2016-11-04 ENCOUNTER — Ambulatory Visit: Attending: Family Medicine | Admitting: Physical Therapy

## 2016-11-04 DIAGNOSIS — M6281 Muscle weakness (generalized): Secondary | ICD-10-CM | POA: Diagnosis present

## 2016-11-04 DIAGNOSIS — R293 Abnormal posture: Secondary | ICD-10-CM | POA: Diagnosis present

## 2016-11-04 DIAGNOSIS — M542 Cervicalgia: Secondary | ICD-10-CM | POA: Insufficient documentation

## 2016-11-04 NOTE — Patient Instructions (Signed)
Over Head Pull: Narrow Grip       On back, knees bent, feet flat, band across thighs, elbows straight but relaxed. Pull hands apart (start). Keeping elbows straight, bring arms up and over head, hands toward floor. Keep pull steady on band. Hold momentarily. Return slowly, keeping pull steady, back to start. Repeat __10_ times. Band color ____RED__   Side Pull: Double Arm   On back, knees bent, feet flat. Arms perpendicular to body, shoulder level, elbows straight but relaxed. Pull arms out to sides, elbows straight. Resistance band comes across collarbones, hands toward floor. Hold momentarily. Slowly return to starting position. Repeat _10__ times. Band color _____RED   Sash   On back, knees bent, feet flat, left hand on left hip, right hand above left. Pull right arm DIAGONALLY (hip to shoulder) across chest. Bring right arm along head toward floor. Hold momentarily. Slowly return to starting position. Repeat __10_ times. Do with left arm. Band color __RED____   Shoulder Rotation: Double Arm   On back, knees bent, feet flat, elbows tucked at sides, bent 90, hands palms up. Pull hands apart and down toward floor, keeping elbows near sides. Hold momentarily. Slowly return to starting position. Repeat __10_ times. Band color ____RED__   

## 2016-11-04 NOTE — Therapy (Signed)
[facility] [address] [place], [place], [address] Phone: [phone]   Fax:  [phone]  Physical Therapy Treatment  Patient Details  Name: Tracey Malone MRN: [id number] Date of Birth: [birth year] Referring Provider: Dr. Lynnea FerrierWarren Pickard   Encounter Date: 11/04/2016      PT End of Session - 11/04/16 1630    Visit Number 3   Number of Visits 8   Date for PT Re-Evaluation 11/23/16   PT Start Time 1551  pt late   PT Stop Time 1640   PT Time Calculation (min) 49 min   Activity Tolerance Patient tolerated treatment well   Behavior During Therapy [facility] for tasks assessed/performed      Past Medical History:  Diagnosis Date  . Bell's palsy   . Migraines     Past Surgical History:  Procedure Laterality Date  . HEMORRHOID SURGERY    . INCONTINENCE SURGERY    . TUBAL LIGATION      There were no vitals filed for this visit.      Subjective Assessment - 11/04/16 1555    Subjective She did not like the dry needling.  Too painful.  However, ROM is better and may consider it again. Has not taked Valium (or anything ) in 4 days.  No spasms   Currently in Pain? Yes   Pain Score 2    Pain Location Neck   Pain Orientation Left   Pain Descriptors / Indicators Sore   Pain Type Chronic pain   Pain Onset More than a month ago   Pain Frequency Intermittent   Aggravating Factors  ext and looking L    Pain Relieving Factors rest, Valium, heat              [facility] Adult PT Treatment/Exercise - 11/04/16 1559      Neck Exercises: Standing   Neck Retraction 5 reps   Other Standing Exercises standing scapular stab ex with chin tuck: over head , horiz abd, ER , x 10 each, pressing back into the wall      Moist Heat Therapy   Number Minutes Moist Heat 10 Minutes   Moist Heat Location Cervical     Manual Therapy   Soft tissue mobilization Bilateral L>R upper traps, post cervicals and L rhomboid, infraspinatus     Neck  Exercises: Stretches   Upper Trapezius Stretch 1 rep;30 seconds   Upper Trapezius Stretch Limitations pain on L to L    Levator Stretch 2 reps;30 seconds   Levator Stretch Limitations each side    Corner Stretch 3 reps;30 seconds   Other Neck Stretches combo flex, rot self mob x 2 fingers, and x 4 fingers x 2 each , 30 sec hold                 PT Education - 11/04/16 1630    Education provided Yes   Education Details posture HEP    Person(s) Educated Patient   Methods Explanation;Demonstration   Comprehension Verbalized understanding          PT Short Term Goals - 10/08/16 1107      PT SHORT TERM GOAL #1   Title Pt will be I with HEP for bilateral LEs, ankles    Time 4   Period Weeks   Status Achieved     PT SHORT TERM GOAL #2   Title Pt will be out of the Boot and walk without a limp (as MD allows) for short distances  Time 4   Period Weeks   Status Achieved     PT SHORT TERM GOAL #3   Title Pt will report only min swelling end of the day after working.    Baseline has been sick, so far swelling is better   Time 4   Period Weeks   Status Unable to assess     PT SHORT TERM GOAL #4   Title Pt will use RICE consistently at home for pain and swelling relief.    Status Achieved           PT Long Term Goals - 11/04/16 1633      PT LONG TERM GOAL #1   Title Pt will be I with concepts of posture and body mechanics, lifting.    Status On-going     PT LONG TERM GOAL #2   Title Pt will have full AROM of cervical spine without pain.    Baseline improved, not measured    Status On-going     PT LONG TERM GOAL #3   Title Pt will be able to demo 5/5 strength in LUE for full function.    Status On-going     PT LONG TERM GOAL #4   Title Pt will be able to sleep without waking from pain, modified position.    Status On-going               Plan - 11/04/16 1631    Clinical Impression Statement Pt with bilateral upper trap tightness, stressed at  work.  Rt. side felt more knotty than L today.  Pain with DN but she did like the result, open to trying agin.  Used MHP post to relieve pain and give relaxation.  Bruised Lt upper trap, notified patient.    PT Next Visit Plan supine scap stab (HEP) with red band- review, manual and consider DN again. , posture   PT Home Exercise Plan chin tuck, levator scap, upper trap stretch and scap retraction, upper cervical rotations, supine scap stab red      Patient will benefit from skilled therapeutic intervention in order to improve the following deficits and impairments:  Difficulty walking, Pain, Impaired flexibility, Decreased balance, Decreased mobility, Decreased strength, Impaired sensation, Increased fascial restricitons, Decreased range of motion, Increased muscle spasms, Impaired UE functional use, Postural dysfunction  Visit Diagnosis: Cervicalgia  Abnormal posture  Muscle weakness (generalized)     Problem List Patient Active Problem List   Diagnosis Date Noted  . Bell's palsy   . UTI (urinary tract infection) 06/27/2013  . GERD (gastroesophageal reflux disease) 06/27/2013  . ADD (attention deficit disorder) 06/27/2013  . Migraines     Deven Audi 11/04/2016, 4:34 PM  [facility] [address] [place], [place], [address] Phone: [phone]   Fax:  [phone]  Name: Tracey Malone MRN: [id number] Date of Birth: [birth year]  Karie Mainland, PT 11/04/16 4:35 PM Phone: [phone] Fax: [phone]

## 2016-11-05 MED ORDER — METHYLPHENIDATE HCL ER (LA) 10 MG PO CP24: 10.0000 mg | ORAL_CAPSULE | ORAL | 0 refills | Status: DC

## 2016-11-08 ENCOUNTER — Ambulatory Visit: Admitting: Physical Therapy

## 2016-11-08 DIAGNOSIS — R293 Abnormal posture: Secondary | ICD-10-CM

## 2016-11-08 DIAGNOSIS — M542 Cervicalgia: Secondary | ICD-10-CM

## 2016-11-08 DIAGNOSIS — M6281 Muscle weakness (generalized): Secondary | ICD-10-CM

## 2016-11-08 NOTE — Therapy (Addendum)
Tracey Malone, [place], [address] Phone: [phone]   Fax:  [phone]  Physical Therapy Treatment / Discharge Note  Patient Details  Name: Tracey Malone MRN: [id number] Date of Birth: 05-03-1976 Referring Provider: Dr. Jenna Luo   Encounter Date: 11/08/2016      PT End of Session - 11/08/16 1414    Visit Number 4   Number of Visits 8   Date for PT Re-Evaluation 11/23/16   PT Start Time 0923   PT Stop Time 1458   PT Time Calculation (min) 43 min   Activity Tolerance Patient tolerated treatment well   Behavior During Therapy [facility] for tasks assessed/performed      Past Medical History:  Diagnosis Date  . Bell's palsy   . Migraines     Past Surgical History:  Procedure Laterality Date  . HEMORRHOID SURGERY    . INCONTINENCE SURGERY    . TUBAL LIGATION      There were no vitals filed for this visit.      Subjective Assessment - 11/08/16 1415    Subjective "since the last session the neck has been really sore"   Currently in Pain? Yes   Pain Score 8    Pain Orientation Left   Pain Descriptors / Indicators Sore   Pain Type Chronic pain   Pain Onset More than a month ago   Pain Frequency Intermittent                         [facility] Adult PT Treatment/Exercise - 11/08/16 0001      Modalities   Modalities Electrical Stimulation     Electrical Stimulation   Electrical Stimulation Location L upper trap   Electrical Stimulation Action IFC   Electrical Stimulation Parameters L 24, 100% scan, fixed rate 10 BPM x 15 min   Electrical Stimulation Goals Other (comment)  fixed beat to calm down muscle tightness     Ultrasound   Ultrasound Location L upper trap  stretching periodically throughout [place].   Ultrasound Parameters 1.o w/cm2, 1 mhz x 8 min   Ultrasound Goals Pain;Other (Comment)  muscle tightness     Manual Therapy   Soft tissue mobilization L upper trap IASTM    Myofascial Release stretching/ rolling over the L upper trap/ levator scapulae   Mulligan upper trap inhibition taping on the L only     Neck Exercises: Stretches   Upper Trapezius Stretch 3 reps;30 seconds  contract relax with 10 sec hold                PT Education - 11/08/16 1501    Education provided Yes   Education Details E-stim and treatment rationale regarding twitch response fatigue    Person(s) Educated Patient   Methods Explanation;Verbal cues   Comprehension Verbalized understanding;Verbal cues required          PT Short Term Goals - 10/08/16 1107      PT SHORT TERM GOAL #1   Title Pt will be I with HEP for bilateral LEs, ankles    Time 4   Period Weeks   Status Achieved     PT SHORT TERM GOAL #2   Title Pt will be out of the Boot and walk without a limp (as MD allows) for short distances   Time 4   Period Weeks   Status Achieved     PT SHORT TERM GOAL #3   Title  Pt will report only min swelling end of the day after working.    Baseline has been sick, so far swelling is better   Time 4   Period Weeks   Status Unable to assess     PT SHORT TERM GOAL #4   Title Pt will use RICE consistently at home for pain and swelling relief.    Status Achieved           PT Long Term Goals - 11/04/16 1633      PT LONG TERM GOAL #1   Title Pt will be I with concepts of posture and body mechanics, lifting.    Status On-going     PT LONG TERM GOAL #2   Title Pt will have full AROM of cervical spine without pain.    Baseline improved, not measured    Status On-going     PT LONG TERM GOAL #3   Title Pt will be able to demo 5/5 strength in LUE for full function.    Status On-going     PT LONG TERM GOAL #4   Title Pt will be able to sleep without waking from pain, modified position.    Status On-going               Plan - 11/08/16 1505    Clinical Impression Statement [name] reports today she is at 54/10. opted to hold off on DN due to  increased pain that has been consistent since the last session. focused on pain today via e-stime via twitch to calm down tightness. Folling E-stim set at twitch she reported pain dropped and perofrmed [place] with pt performing stretching throughout treatment. post session she reported to 2/10.    PT Treatment/Interventions ADLs/Self Care Home Management;Therapeutic activities;Therapeutic exercise;Ultrasound;Manual techniques;Taping;Neuromuscular re-education;Cryotherapy;Electrical Stimulation;Iontophoresis 60m/ml Dexamethasone;Functional mobility training;Patient/family education;Vasopneumatic Device;Passive range of motion;Moist Heat;Dry needling   PT Next Visit Plan response to E-stim/ [place] supine scap stab (HEP) with red band- review, manual and consider DN again. , posture,   Consulted and Agree with Plan of Care Patient      Patient will benefit from skilled therapeutic intervention in order to improve the following deficits and impairments:  Difficulty walking, Pain, Impaired flexibility, Decreased balance, Decreased mobility, Decreased strength, Impaired sensation, Increased fascial restricitons, Decreased range of motion, Increased muscle spasms, Impaired UE functional use, Postural dysfunction  Visit Diagnosis: Cervicalgia  Abnormal posture  Muscle weakness (generalized)     Problem List Patient Active Problem List   Diagnosis Date Noted  . Bell's palsy   . UTI (urinary tract infection) 06/27/2013  . GERD (gastroesophageal reflux disease) 06/27/2013  . ADD (attention deficit disorder) 06/27/2013  . Migraines    Tracey Malone PT, DPT, LAT, ATC  11/08/16  3:14 PM      [facility]18[address] CourtGM[place] [address]Phone: [phone]  Fax:  [phone] Name: Tracey MOTZMRN: [id number] of Birth: 7May 16, 1977    PHYSICAL THERAPY DISCHARGE SUMMARY  Visits from Start of Care: 4  Current functional  level related to goals / functional outcomes: See goals   Remaining deficits: unknown   Education / Equipment: HEP, posture, lifting mechanics, anatomy of area involved  Plan: Patient agrees to discharge.  Patient goals were partially met. Patient is being discharged due to not returning since the last visit.  ?????     Tracey Malone PT, DPT, LAT, ATC  12/07/16  1:37 PM

## 2016-11-11 ENCOUNTER — Ambulatory Visit: Admitting: Physical Therapy

## 2016-11-15 ENCOUNTER — Ambulatory Visit: Admitting: Physical Therapy

## 2016-11-18 ENCOUNTER — Ambulatory Visit: Admitting: Physical Therapy

## 2016-11-22 ENCOUNTER — Ambulatory Visit: Admitting: Physical Therapy

## 2016-11-25 ENCOUNTER — Encounter: Admitting: Physical Therapy

## 2016-11-29 ENCOUNTER — Encounter: Admitting: Physical Therapy

## 2016-12-02 ENCOUNTER — Encounter: Admitting: Physical Therapy

## 2016-12-06 ENCOUNTER — Encounter: Payer: Self-pay | Admitting: Family Medicine

## 2016-12-06 ENCOUNTER — Other Ambulatory Visit: Payer: Self-pay | Admitting: Family Medicine

## 2016-12-06 DIAGNOSIS — M542 Cervicalgia: Secondary | ICD-10-CM

## 2016-12-06 MED ORDER — METHYLPHENIDATE HCL ER (LA) 10 MG PO CP24: 10.0000 mg | ORAL_CAPSULE | ORAL | 0 refills | Status: DC

## 2016-12-11 ENCOUNTER — Other Ambulatory Visit

## 2016-12-13 ENCOUNTER — Encounter: Payer: Self-pay | Admitting: Family Medicine

## 2017-01-20 ENCOUNTER — Encounter: Payer: Self-pay | Admitting: Family Medicine

## 2017-01-24 ENCOUNTER — Ambulatory Visit (INDEPENDENT_AMBULATORY_CARE_PROVIDER_SITE_OTHER): Admitting: Orthopaedic Surgery

## 2017-01-24 DIAGNOSIS — M25571 Pain in right ankle and joints of right foot: Secondary | ICD-10-CM | POA: Diagnosis not present

## 2017-01-24 NOTE — Progress Notes (Signed)
Office Visit Note   Patient: Tracey Malone           Date of Birth: 06-30-1976           MRN: [id number] Visit Date: 01/24/2017              Requested by: Donita Brooks, MD [address] [facility] [address] [place], [place] [address] PCP: Leo Grosser, MD   Assessment & Plan: Visit Diagnoses:  1. Pain in right ankle and joints of right foot     Plan: MRI right ankle to evaluate for structural abnormalities. Patient has failed extensive conservative treatment. Follow-up with Bronson Curb in about 10 days.  Follow-Up Instructions: Return in about 10 days (around 02/03/2017) for Rexene Edison.   Orders:  Orders Placed This Encounter  Procedures  . MR ANKLE RIGHT WO CONTRAST   No orders of the defined types were placed in this encounter.     Procedures: No procedures performed   Clinical Data: No additional findings.   Subjective: Chief Complaint  Patient presents with  . Right Ankle - Pain  . Left Ankle - Pain    Patient is a 41 year old female who is a patient of [facility]'s who comes in with continued right ankle pain for over 2 months. She has been doing physical therapy. She has swelling and pain throughout the day. The left ankle injection helped a lot.    Review of Systems  Constitutional: Negative.   HENT: Negative.   Eyes: Negative.   Respiratory: Negative.   Cardiovascular: Negative.   Endocrine: Negative.   Musculoskeletal: Negative.   Neurological: Negative.   Hematological: Negative.   Psychiatric/Behavioral: Negative.   All other systems reviewed and are negative.    Objective: Vital Signs: There were no vitals taken for this visit.  Physical Exam  Constitutional: She is oriented to person, place, and time. She appears well-developed and well-nourished.  Pulmonary/Chest: Effort normal.  Neurological: She is alert and oriented to person, place, and time.  Skin: Skin is warm. Capillary refill takes less than 2 seconds.  Psychiatric: She has a normal mood  and affect. Her behavior is normal. Judgment and thought content normal.  Nursing note and vitals reviewed.   Ortho Exam Right ankle exam shows tenderness at the Achilles insertion. Peroneal tendons are nontender. She does have a very prominent peroneal tubercle. Overall range of motion is normal. Specialty Comments:  No specialty comments available.  Imaging: No results found.   PMFS History: Patient Active Problem List   Diagnosis Date Noted  . Bell's palsy   . UTI (urinary tract infection) 06/27/2013  . GERD (gastroesophageal reflux disease) 06/27/2013  . ADD (attention deficit disorder) 06/27/2013  . Migraines    Past Medical History:  Diagnosis Date  . Bell's palsy   . Migraines     Family History  Problem Relation Age of Onset  . COPD Father     Past Surgical History:  Procedure Laterality Date  . HEMORRHOID SURGERY    . INCONTINENCE SURGERY    . TUBAL LIGATION     Social History   Occupational History  . Not on file.   Social History Main Topics  . Smoking status: Never Smoker  . Smokeless tobacco: Not on file  . Alcohol use No  . Drug use: No  . Sexual activity: Yes    Birth control/ protection: Surgical     Comment: Married to [employer], two kids.  Retired from [employer].

## 2017-02-01 ENCOUNTER — Other Ambulatory Visit (INDEPENDENT_AMBULATORY_CARE_PROVIDER_SITE_OTHER): Payer: Self-pay | Admitting: Orthopaedic Surgery

## 2017-02-04 ENCOUNTER — Ambulatory Visit (INDEPENDENT_AMBULATORY_CARE_PROVIDER_SITE_OTHER): Admitting: Physician Assistant

## 2017-02-07 ENCOUNTER — Ambulatory Visit
Admission: RE | Admit: 2017-02-07 | Discharge: 2017-02-07 | Disposition: A | Discharge status: Home / Self Care | Source: Ambulatory Visit | Attending: Orthopaedic Surgery | Admitting: Orthopaedic Surgery

## 2017-02-07 DIAGNOSIS — M25571 Pain in right ankle and joints of right foot: Secondary | ICD-10-CM

## 2017-02-14 ENCOUNTER — Encounter (INDEPENDENT_AMBULATORY_CARE_PROVIDER_SITE_OTHER): Payer: Self-pay | Admitting: Physician Assistant

## 2017-02-14 ENCOUNTER — Ambulatory Visit (INDEPENDENT_AMBULATORY_CARE_PROVIDER_SITE_OTHER): Admitting: Physician Assistant

## 2017-02-14 DIAGNOSIS — M25571 Pain in right ankle and joints of right foot: Secondary | ICD-10-CM | POA: Insufficient documentation

## 2017-02-14 DIAGNOSIS — S93491A Sprain of other ligament of right ankle, initial encounter: Secondary | ICD-10-CM

## 2017-02-14 MED ORDER — METHYLPREDNISOLONE ACETATE 40 MG/ML IJ SUSP
20.0000 mg | INTRAMUSCULAR | Status: AC | PRN
  Administered 2017-02-14: 20 mg via INTRA_ARTICULAR

## 2017-02-14 MED ORDER — LIDOCAINE HCL 1 % IJ SOLN
0.5000 mL | INTRAMUSCULAR | Status: AC | PRN
  Administered 2017-02-14: .5 mL

## 2017-02-14 NOTE — Progress Notes (Signed)
Office Visit Note   Patient: Tracey Malone           Date of Birth: 1976/04/10           MRN: [id number] Visit Date: 02/14/2017              Requested by: Tracey Malone [address], [place] [address] PCP: Tracey Malone   Assessment & Plan: Visit Diagnoses:  1. Tear of talofibular ligament of right lower extremity, initial encounter   2. Sinus tarsi syndrome of right ankle     Plan: Discussed with her shoe wear. Also discussed with her gastrocsoleus stretching exercises. See her back in a month to check her progress or lack of.  Follow-Up Instructions: Return in about 4 weeks (around 03/14/2017).   Orders:  No orders of the defined types were placed in this encounter.  No orders of the defined types were placed in this encounter.     Procedures: Small Joint Inj Date/Time: 02/14/2017 11:41 AM Performed by: Tracey BouchardLARK, Tracey Malone Authorized by: Tracey BouchardLARK, Hensley Aziz Malone   Consent Given by:  Patient Indications:  Pain Location:  Foot Site:  R subtalar Approach:  Lateral Ultrasound Guided: No   Fluoroscopic Guidance: No   Medications:  0.5 mL lidocaine 1 %; 20 mg methylPREDNISolone acetate 40 MG/ML Patient tolerance:  Patient tolerated the procedure well with no immediate complications     Clinical Data: No additional findings.   Subjective: Chief Complaint  Patient presents with  . Right Ankle - Pain    HPI Mrs. Tracey FearingJames returns today follow-up after MRI of her right ankle. Left ankle its overall doing well after sinus Tarsi injection. She said the pain lateral aspect of the right ankle with some swelling particularly of the day. She's also having some heel pain and Achilles pain. She had no known injury. Over the last few weeks that she has been wearing some flip flops for her job.  MRI is reviewed with patient today dated 02/07/2017 right ankle: Showed mild subtalar arthritis no acute fracture or worrisome lesions.. 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  Physical Exam  Ortho Exam Right ankle no edema ecchymosis erythema appreciated. She has good dorsiflexion plantar flexion ankle. Tenderness over the right sinus Tarsi region. Tenderness over the medial tubercle of the calcaneus right foot. No tenderness over the right Achilles Achilles intact. Tight gastroc right leg. Specialty Comments:  No specialty comments available.  Imaging: No results found.   PMFS History: Patient Active Problem List   Diagnosis Date Noted  . Pain in right ankle and joints of right foot 02/14/2017  . 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 Bell's palsy   . UTI (urinary tract infection) 06/27/2013  . GERD (gastroesophageal reflux disease) 06/27/2013  . ADD (attention deficit disorder) 06/27/2013  . Migraines    Past Medical History:  Diagnosis Date  . Bell's palsy   . Migraines     Family History  Problem Relation Age of Onset  . COPD Father     Past Surgical History:  Procedure Laterality Date  . HEMORRHOID SURGERY    . INCONTINENCE SURGERY    . TUBAL LIGATION     Social History   Occupational History  . Not on file.   Social History Main Topics  . Smoking status:  Never Smoker  . Smokeless tobacco: Never Used  . Alcohol use No  . Drug use: No  . Sexual activity: Yes    Birth control/ protection: Surgical     Comment: Married to Tracey Malone, two kids.  Retired from [employer].

## 2017-02-15 ENCOUNTER — Telehealth (INDEPENDENT_AMBULATORY_CARE_PROVIDER_SITE_OTHER): Payer: Self-pay

## 2017-02-15 NOTE — Telephone Encounter (Signed)
Received call from patient stating that she had injection yesterday and now was experiencing burning and increased pain in her heel/foot. I s/w Dr Magnus IvanBlackman and Bronson CurbGil and advised patient per their suggestion to take ani-inflammatory, rest, ice and do stretching.

## 2017-02-16 ENCOUNTER — Ambulatory Visit (INDEPENDENT_AMBULATORY_CARE_PROVIDER_SITE_OTHER): Admitting: Physician Assistant

## 2017-03-14 ENCOUNTER — Ambulatory Visit (INDEPENDENT_AMBULATORY_CARE_PROVIDER_SITE_OTHER): Admitting: Physician Assistant

## 2017-03-14 ENCOUNTER — Encounter (INDEPENDENT_AMBULATORY_CARE_PROVIDER_SITE_OTHER): Payer: Self-pay | Admitting: Physician Assistant

## 2017-03-14 DIAGNOSIS — S93491D Sprain of other ligament of right ankle, subsequent encounter: Secondary | ICD-10-CM

## 2017-03-14 MED ORDER — DICLOFENAC SODIUM 75 MG PO TBEC: 75.0000 mg | DELAYED_RELEASE_TABLET | Freq: Two times a day (BID) | ORAL | 1 refills | Status: DC

## 2017-03-14 NOTE — Progress Notes (Signed)
[name] returns today follow-up of her right ankle. She underwent right ankle sinus tarsi injection states the injection causes her pain for 4 days and then after that her pain went back to where it was. 7 swelling globally about the ankle. Currently taking ibuprofen for the pain. In MRI of her right ankle dated 02/07/2017 showed chronic partial tear of the anterior talofibular ligament and mild appearing subtalar osteoarthritis.  Physical exam: Right ankle good dorsiflexion plantar flexion without pain. She has 5 out of 5 strengths with inversion eversion against resistance. Nontender over the Achilles plantar fascia. There is no rashes skin lesions ulcerations erythema about the ankle or the right lower leg. No calf tenderness. She has tenderness only over the right sinus tarsi region.   Impression : RIGHT subtalar osteoarthritis mild We'll have her start taking diclofenac 75 mg twice a day for the next 2 weeks and then once daily. Continue her ASO brace which she has a home also user ankle sleeve that she has. Like see her back in 1 month check progress or lack of. Questions encouraged and answered.

## 2017-04-04 ENCOUNTER — Encounter: Payer: Self-pay | Admitting: Family Medicine

## 2017-04-04 ENCOUNTER — Other Ambulatory Visit: Payer: Self-pay | Admitting: Family Medicine

## 2017-04-04 DIAGNOSIS — Z1239 Encounter for other screening for malignant neoplasm of breast: Secondary | ICD-10-CM

## 2017-04-04 MED ORDER — CYCLOBENZAPRINE HCL 10 MG PO TABS: 10.0000 mg | ORAL_TABLET | Freq: Three times a day (TID) | ORAL | 0 refills | Status: DC

## 2017-04-04 MED ORDER — METHYLPHENIDATE HCL ER (LA) 10 MG PO CP24: 10.0000 mg | ORAL_CAPSULE | ORAL | 0 refills | Status: DC

## 2017-04-04 NOTE — Telephone Encounter (Signed)
RX printed, left up front and patient aware to pick up via mychart 

## 2017-04-05 NOTE — Telephone Encounter (Signed)
Referral for mammogram placed 

## 2017-04-05 NOTE — Addendum Note (Signed)
Addended by: Legrand RamsWILLIS, SANDY B on: 04/05/2017 09:42 AM   Modules accepted: Orders

## 2017-04-11 ENCOUNTER — Ambulatory Visit (INDEPENDENT_AMBULATORY_CARE_PROVIDER_SITE_OTHER): Admitting: Physician Assistant

## 2017-04-11 ENCOUNTER — Encounter: Payer: Self-pay | Admitting: Family Medicine

## 2017-04-11 DIAGNOSIS — M25571 Pain in right ankle and joints of right foot: Secondary | ICD-10-CM | POA: Diagnosis not present

## 2017-04-11 DIAGNOSIS — L989 Disorder of the skin and subcutaneous tissue, unspecified: Secondary | ICD-10-CM

## 2017-04-11 MED ORDER — LIDOCAINE HCL 1 % IJ SOLN
0.5000 mL | INTRAMUSCULAR | Status: AC | PRN
  Administered 2017-04-11: .5 mL

## 2017-04-11 MED ORDER — METHYLPREDNISOLONE ACETATE 40 MG/ML IJ SUSP
40.0000 mg | INTRAMUSCULAR | Status: AC | PRN
  Administered 2017-04-11: 40 mg

## 2017-04-11 NOTE — Progress Notes (Signed)
   Procedure Note  Patient: Tracey Malone             Date of Birth: Nov 17, 1975           MRN: [id number]             Visit Date: 04/11/2017 Tracey Malone returns today follow-up of her right ankle. States she still having pain and swelling in the right ankle particularly at night. She is walking well in the [employer] Walker boot. MRI of her right ankle dated 02/07/2017 showed chronic partial tear of the anterior tibial talar ligament and mild appearing subtalar osteoarthritis. She's having no mechanical symptoms of the ankle.  Physical exam: Right ankle is slight edema laterally of the right ankle compared to left. There is no rashes skin lesions ulcerations are erythema of the right ankle. Tenderness over the right sinus tarsi region only. She does have pain in joint. Good range of motion of the ankle without pain. Nontender over the posterior tibial tendon peroneal tendons of the right ankle.  Procedures: Visit Diagnoses: Sinus tarsi syndrome of right ankle - Plan: Foot Injection  Foot Inj Date/Time: 04/11/2017 4:56 PM Performed by: Kirtland BouchardLARK, Demarques Pilz W Authorized by: Kirtland BouchardLARK, Lona Six W   Consent Given by:  Patient Indications:  Pain Condition comment:  Sinus taris arthritis Prep: patient was prepped and draped in usual sterile fashion   Approach:  Anterolateral Medications:  0.5 mL lidocaine 1 %; 40 mg methylPREDNISolone acetate 40 MG/ML  Plan: Discussed with her referring her to a foot and ankle specialist versus a second injection she has decided to try 1 more injection. She'll call our office and let us know how well she is done with the injection if her pain persists despite this second injection recommend referral to foot and ankle specialist.

## 2017-04-21 ENCOUNTER — Other Ambulatory Visit: Payer: Self-pay | Admitting: Family Medicine

## 2017-04-21 DIAGNOSIS — Z1239 Encounter for other screening for malignant neoplasm of breast: Secondary | ICD-10-CM

## 2017-04-21 NOTE — Progress Notes (Signed)
Re ordered mammogram for screening instead of diagnostic.

## 2017-06-07 ENCOUNTER — Encounter (INDEPENDENT_AMBULATORY_CARE_PROVIDER_SITE_OTHER): Payer: Self-pay | Admitting: Physician Assistant

## 2017-06-07 ENCOUNTER — Encounter: Payer: Self-pay | Admitting: Family Medicine

## 2017-06-08 ENCOUNTER — Other Ambulatory Visit (INDEPENDENT_AMBULATORY_CARE_PROVIDER_SITE_OTHER): Payer: Self-pay

## 2017-06-08 DIAGNOSIS — M25571 Pain in right ankle and joints of right foot: Secondary | ICD-10-CM

## 2017-06-09 ENCOUNTER — Other Ambulatory Visit: Payer: Self-pay | Admitting: Family Medicine

## 2017-06-09 DIAGNOSIS — Z1231 Encounter for screening mammogram for malignant neoplasm of breast: Secondary | ICD-10-CM

## 2017-06-14 ENCOUNTER — Encounter (INDEPENDENT_AMBULATORY_CARE_PROVIDER_SITE_OTHER): Payer: Self-pay | Admitting: *Deleted

## 2017-06-14 ENCOUNTER — Telehealth (INDEPENDENT_AMBULATORY_CARE_PROVIDER_SITE_OTHER): Payer: Self-pay | Admitting: *Deleted

## 2017-06-14 NOTE — Telephone Encounter (Signed)
Pt has [facility] with Dr. Lance MussAron Scott on Sept 25 at 11am with arrival at 10:40am to register at [address] [place]. Left message for pt to return my call for appt.

## 2017-06-16 NOTE — Telephone Encounter (Signed)
Pt aware of appt via Mychart

## 2017-06-17 ENCOUNTER — Ambulatory Visit

## 2017-06-24 ENCOUNTER — Telehealth (INDEPENDENT_AMBULATORY_CARE_PROVIDER_SITE_OTHER): Payer: Self-pay | Admitting: Orthopaedic Surgery

## 2017-06-24 NOTE — Telephone Encounter (Signed)
Received VM from [place] at [facility]. Patient referred and is scheduled next week, need records faxed. I faxed records [phone]. Ph [phone]

## 2017-06-27 ENCOUNTER — Telehealth (INDEPENDENT_AMBULATORY_CARE_PROVIDER_SITE_OTHER): Payer: Self-pay | Admitting: Orthopaedic Surgery

## 2017-06-27 NOTE — Telephone Encounter (Signed)
Patient called having questions about her referral. CB # [phone]

## 2017-06-27 NOTE — Telephone Encounter (Signed)
error 

## 2017-06-28 ENCOUNTER — Telehealth (INDEPENDENT_AMBULATORY_CARE_PROVIDER_SITE_OTHER): Payer: Self-pay | Admitting: *Deleted

## 2017-06-28 NOTE — Telephone Encounter (Signed)
-----   Message from Clydie Braun, [place] sent at 06/28/2017 10:21 AM EDT ----- Regarding: Referral  Patient said she cancelled her appt with Dr. Lorin Picket in [place] today cause they told her she needed a referral from [place] and they didn't have it??

## 2017-06-28 NOTE — Telephone Encounter (Signed)
I resubmitted authorization thru [email or web address], pending authorization

## 2017-06-28 NOTE — Telephone Encounter (Signed)
States she couldn't go to the appt with Dr.SCott today cause we didn't send referral/auth? Sent to scheduling to try to help figure this out

## 2017-07-01 ENCOUNTER — Telehealth: Payer: Self-pay | Admitting: Family Medicine

## 2017-07-01 DIAGNOSIS — M25571 Pain in right ankle and joints of right foot: Principal | ICD-10-CM

## 2017-07-01 DIAGNOSIS — G8929 Other chronic pain: Secondary | ICD-10-CM

## 2017-07-01 NOTE — Telephone Encounter (Signed)
Pt sent to [facility] for right ankle problem.  That provider wants her to see an Ortho Spec at [facility].  We the PCP need to submit referral.  Referral placed

## 2017-07-01 NOTE — Telephone Encounter (Signed)
Received notification from [facility] stating incomplete, stating pt is enrolled in [employer] and a referral from their assigned primary care manager is required and to have the referral completed by her PCP.  I called over to [facility] and [place] Soledad Gerlach referral coordinator and advised her referral needed to be completed by them. I faxed over the notes and [employer] information to her attn to complete referral.

## 2017-07-25 DIAGNOSIS — G8929 Other chronic pain: Secondary | ICD-10-CM | POA: Insufficient documentation

## 2017-08-02 ENCOUNTER — Emergency Department (HOSPITAL_COMMUNITY)

## 2017-08-02 ENCOUNTER — Emergency Department (HOSPITAL_COMMUNITY)
Admission: EM | Admit: 2017-08-02 | Discharge: 2017-08-02 | Disposition: A | Discharge status: Home / Self Care | Attending: Emergency Medicine | Admitting: Emergency Medicine

## 2017-08-02 ENCOUNTER — Encounter (HOSPITAL_COMMUNITY): Payer: Self-pay | Admitting: Emergency Medicine

## 2017-08-02 DIAGNOSIS — Z79899 Other long term (current) drug therapy: Secondary | ICD-10-CM | POA: Diagnosis not present

## 2017-08-02 DIAGNOSIS — M25571 Pain in right ankle and joints of right foot: Secondary | ICD-10-CM | POA: Diagnosis present

## 2017-08-02 DIAGNOSIS — F909 Attention-deficit hyperactivity disorder, unspecified type: Secondary | ICD-10-CM | POA: Insufficient documentation

## 2017-08-02 MED ORDER — DICLOFENAC SODIUM 75 MG PO TBEC: 75.0000 mg | DELAYED_RELEASE_TABLET | Freq: Two times a day (BID) | ORAL | 1 refills | Status: DC | PRN
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
## 2017-08-02 NOTE — Discharge Instructions (Signed)
Diclofenac (Voltaren) as needed for pain. Ice and alleviate for additional pain relief. If your symptoms persist without improvement in one week, please follow up with your primary care doctor.    TREATMENT  Rest, ice, elevation, and compression are the basic modes of treatment.    HOME CARE INSTRUCTIONS  Apply ice to the sore area for 15 to 20 minutes, 3 to 4 times per day. Do this while you are awake for the first 2 days. This can be stopped when the swelling goes away. Put the ice in a plastic bag and place a towel between the bag of ice and your skin.  Keep your leg elevated when possible to lessen swelling.  You may take off your ankle stabilizer at night and to take a shower or bath. Wiggle your toes several times per day if you are able.  ACTIVITY:            - Weight bearing as tolerated - if you can do it, do it. If it hurts, stop.             - Exercises should be limited to pain free range of motion            - Can start mobilization by tracing the alphabet with your foot in the air.       SEEK MEDICAL CARE IF:  You have an increase in bruising, swelling, or pain.  Your toes feel cold.  Pain relief is not achieved with medications.  EMERGENCY:: Your toes are numb or blue or you have severe pain.   MAKE SURE YOU:  Understand these instructions.  Will watch your condition.  Will get help right away if you are not doing well or get worse

## 2017-08-10 ENCOUNTER — Encounter: Payer: Self-pay | Admitting: Family Medicine

## 2017-08-10 DIAGNOSIS — M79671 Pain in right foot: Secondary | ICD-10-CM

## 2017-08-30 ENCOUNTER — Telehealth: Payer: Self-pay | Admitting: Family Medicine

## 2017-08-30 DIAGNOSIS — M25571 Pain in right ankle and joints of right foot: Secondary | ICD-10-CM

## 2017-08-30 NOTE — Telephone Encounter (Signed)
Pt was [facility] for rt foot and ankle pain.  Was seen on 08/18/17 by Dr Harriet PhoAjlouny.  They have referred her to Dr Avel Sens[place]karlos D[place] for an "ankle scope"  They have made her an appt for 09/06/17 and need [employer] referral.  [facility] [address]  [place], [place] [address] Ph: [phone]

## 2017-10-04 HISTORY — PX: FOOT SURGERY: SHX648

## 2017-10-06 DIAGNOSIS — M6240 Contracture of muscle, unspecified site: Secondary | ICD-10-CM | POA: Insufficient documentation

## 2017-10-09 ENCOUNTER — Encounter: Payer: Self-pay | Admitting: Family Medicine

## 2017-10-10 ENCOUNTER — Encounter: Payer: Self-pay | Admitting: Family Medicine

## 2017-12-02 ENCOUNTER — Encounter: Payer: Self-pay | Admitting: Family Medicine

## 2017-12-02 ENCOUNTER — Other Ambulatory Visit: Payer: Self-pay | Admitting: Family Medicine

## 2017-12-02 MED ORDER — FLUCONAZOLE 150 MG PO TABS: 150.0000 mg | ORAL_TABLET | Freq: Once | ORAL | 0 refills | Status: AC

## 2017-12-18 ENCOUNTER — Other Ambulatory Visit: Payer: Self-pay | Admitting: Family Medicine

## 2017-12-19 MED ORDER — CYCLOBENZAPRINE HCL 10 MG PO TABS: 10.0000 mg | ORAL_TABLET | Freq: Three times a day (TID) | ORAL | 0 refills | Status: DC

## 2017-12-19 NOTE — Telephone Encounter (Signed)
Requesting refill    Flexeril  LOV:  10/14/16  LRF:  04/04/17

## 2018-03-20 DIAGNOSIS — S86301A Unspecified injury of muscle(s) and tendon(s) of peroneal muscle group at lower leg level, right leg, initial encounter: Secondary | ICD-10-CM | POA: Insufficient documentation

## 2018-07-17 ENCOUNTER — Other Ambulatory Visit

## 2018-07-17 DIAGNOSIS — Z13220 Encounter for screening for lipoid disorders: Secondary | ICD-10-CM

## 2018-07-17 DIAGNOSIS — Z0000 Encounter for general adult medical examination without abnormal findings: Secondary | ICD-10-CM

## 2018-07-17 LAB — COMPLETE METABOLIC PANEL WITH GFR
AG Ratio: 1.6 (calc) (ref 1.0–2.5)
ALBUMIN MSPROF: 4.1 g/dL (ref 3.6–5.1)
ALKALINE PHOSPHATASE (APISO): 63 U/L (ref 33–115)
ALT: 20 U/L (ref 6–29)
AST: 16 U/L (ref 10–30)
BILIRUBIN TOTAL: 0.5 mg/dL (ref 0.2–1.2)
BUN: 16 mg/dL (ref 7–25)
CHLORIDE: 102 mmol/L (ref 98–110)
CO2: 29 mmol/L (ref 20–32)
Calcium: 9 mg/dL (ref 8.6–10.2)
Creat: 0.75 mg/dL (ref 0.50–1.10)
GFR, EST AFRICAN AMERICAN: 112 mL/min/{1.73_m2} (ref >=60)
GFR, Est Non African American: 97 mL/min/{1.73_m2} (ref >=60)
GLOBULIN: 2.5 g/dL (ref 1.9–3.7)
Glucose, Bld: 84 mg/dL (ref 65–99)
Potassium: 4.6 mmol/L (ref 3.5–5.3)
Sodium: 139 mmol/L (ref 135–146)
TOTAL PROTEIN: 6.6 g/dL (ref 6.1–8.1)

## 2018-07-17 LAB — CBC WITH DIFFERENTIAL/PLATELET
BASOS ABS: 31 {cells}/uL (ref 0–200)
Basophils Relative: 0.5 %
Eosinophils Absolute: 68 cells/uL (ref 15–500)
Eosinophils Relative: 1.1 %
HEMATOCRIT: 36 % (ref 35.0–45.0)
HEMOGLOBIN: 12.2 g/dL (ref 11.7–15.5)
LYMPHS ABS: 1903 {cells}/uL (ref 850–3900)
MCH: 29.8 pg (ref 27.0–33.0)
MCHC: 33.9 g/dL (ref 32.0–36.0)
MCV: 87.8 fL (ref 80.0–100.0)
MONOS PCT: 9.1 %
MPV: 9.4 fL (ref 7.5–12.5)
NEUTROS ABS: 3633 {cells}/uL (ref 1500–7800)
Neutrophils Relative %: 58.6 %
Platelets: 341 10*3/uL (ref 140–400)
RBC: 4.1 10*6/uL (ref 3.80–5.10)
RDW: 12.8 % (ref 11.0–15.0)
Total Lymphocyte: 30.7 %
WBC mixed population: 564 cells/uL (ref 200–950)
WBC: 6.2 10*3/uL (ref 3.8–10.8)

## 2018-07-17 LAB — LIPID PANEL
CHOL/HDL RATIO: 3.6 (calc) (ref <5.0)
CHOLESTEROL: 223 mg/dL — AB (ref <200)
HDL: 62 mg/dL (ref >50)
LDL Cholesterol (Calc): 132 mg/dL (calc) — ABNORMAL HIGH
Non-HDL Cholesterol (Calc): 161 mg/dL (calc) — ABNORMAL HIGH (ref <130)
Triglycerides: 158 mg/dL — ABNORMAL HIGH (ref <150)

## 2018-07-18 ENCOUNTER — Ambulatory Visit (INDEPENDENT_AMBULATORY_CARE_PROVIDER_SITE_OTHER): Admitting: Family Medicine

## 2018-07-18 ENCOUNTER — Encounter: Payer: Self-pay | Admitting: Family Medicine

## 2018-07-18 VITALS — BP 140/90 | HR 90 | Temp 98.6°F | Resp 16 | Ht 65.0 in | Wt 162.0 lb

## 2018-07-18 DIAGNOSIS — R030 Elevated blood-pressure reading, without diagnosis of hypertension: Secondary | ICD-10-CM

## 2018-07-18 DIAGNOSIS — Z1239 Encounter for other screening for malignant neoplasm of breast: Secondary | ICD-10-CM

## 2018-07-18 DIAGNOSIS — Z0000 Encounter for general adult medical examination without abnormal findings: Secondary | ICD-10-CM

## 2018-07-18 DIAGNOSIS — E785 Hyperlipidemia, unspecified: Secondary | ICD-10-CM

## 2018-07-18 NOTE — Progress Notes (Signed)
Subjective:    Patient ID: Tracey Malone, female    DOB: 1975-12-16, 42 y.o.   MRN: [id number]  HPI Patient is here today for complete physical exam.  She is due for mammogram as she has not had one in quite some time.  Her last Pap smear was performed in 2017 and is due again in 2020.  She is getting her flu shot tomorrow at work.  The remainder of her immunizations are up-to-date.  Her right ankle pain has improved dramatically since she underwent arthroscopic surgery with orthopedics.  The pain has resolved after they removed a bone spur inside the ankle joint that was causing her pain.  However she has been more sedentary over the last 6 months.  She is also staying late at work and eating fast food almost on a daily basis.  As a result, her total cholesterol, triglycerides, and LDL have climbed substantially.  She is gained few pounds.  Her blood pressure is also elevated  Past Medical History:  Diagnosis Date  . ADD (attention deficit disorder)   . Bell's palsy   . Migraines    Past Surgical History:  Procedure Laterality Date  . HEMORRHOID SURGERY    . INCONTINENCE SURGERY    . TUBAL LIGATION     Current Outpatient Medications on File Prior to Visit  Medication Sig Dispense Refill  . cyclobenzaprine (FLEXERIL) 10 MG tablet Take 1 tablet (10 mg total) by mouth every 8 (eight) hours. 30 tablet 0   No current facility-administered medications on file prior to visit.    Allergies  Allergen Reactions  . Codeine Shortness Of Breath  . Topamax [Topiramate]     Cognitive Slowing--types same word over and over. Cannot get out correct words when talking.  Colleen Can [Sumatriptan-Naproxen Sodium] Shortness Of Breath and Swelling    Throat gets tight.  . Penicillins Swelling    Swelling unknown  Has patient had a PCN reaction causing immediate rash, facial/tongue/throat swelling, SOB or lightheadedness with hypotension: Unknown Has patient had a PCN reaction causing severe rash  involving mucus membranes or skin necrosis: No  Has patient had a PCN reaction that required hospitalization: No  Has patient had a PCN reaction occurring within the last 10 years: No  If all of the above answers are "NO", then may proceed with Cep   Social History   Socioeconomic History  . Marital status: Married    Spouse name: Not on file  . Number of children: Not on file  . Years of education: Not on file  . Highest education level: Not on file  Occupational History  . Not on file  Social Needs  . Financial resource strain: Not on file  . Food insecurity:    Worry: Not on file    Inability: Not on file  . Transportation needs:    Medical: Not on file    Non-medical: Not on file  Tobacco Use  . Smoking status: Never Smoker  . Smokeless tobacco: Never Used  Substance and Sexual Activity  . Alcohol use: No  . Drug use: No  . Sexual activity: Yes    Birth control/protection: Surgical    Comment: Married to [employer], two kids.  Retired from [employer].  Lifestyle  . Physical activity:    Days per week: Not on file    Minutes per session: Not on file  . Stress: Not on file  Relationships  . Social connections:    Talks on phone: Not  on file    Gets together: Not on file    Attends religious service: Not on file    Active member of club or organization: Not on file    Attends meetings of clubs or organizations: Not on file    Relationship status: Not on file  . Intimate partner violence:    Fear of current or ex partner: Not on file    Emotionally abused: Not on file    Physically abused: Not on file    Forced sexual activity: Not on file  Other Topics Concern  . Not on file  Social History Narrative  . Not on file   Family History  Problem Relation Age of Onset  . COPD Father       Review of Systems  All other systems reviewed and are negative.      Objective:   Physical Exam  Constitutional: She is oriented to person, place, and time. She appears  well-developed and well-nourished. No distress.  HENT:  Head: Normocephalic and atraumatic.  Right Ear: External ear normal.  Left Ear: External ear normal.  Nose: Nose normal.  Mouth/Throat: Oropharynx is clear and moist. No oropharyngeal exudate.  Eyes: Pupils are equal, round, and reactive to light. Conjunctivae and EOM are normal. Right eye exhibits no discharge. Left eye exhibits no discharge. No scleral icterus.  Neck: Normal range of motion. Neck supple. No JVD present. No tracheal deviation present. No thyromegaly present.  Cardiovascular: Normal rate, regular rhythm, normal heart sounds and intact distal pulses. Exam reveals no gallop and no friction rub.  No murmur heard. Pulmonary/Chest: Effort normal and breath sounds normal. No respiratory distress. She has no wheezes. She has no rales. She exhibits no tenderness.  Abdominal: Soft. Bowel sounds are normal. She exhibits no distension and no mass. There is no tenderness. There is no rebound and no guarding.  Musculoskeletal: Normal range of motion. She exhibits no edema or tenderness.  Lymphadenopathy:    She has no cervical adenopathy.  Neurological: She is alert and oriented to person, place, and time. She has normal reflexes. No cranial nerve deficit. She exhibits normal muscle tone. Coordination normal.  Skin: Skin is warm. No rash noted. She is not diaphoretic. No erythema. No pallor.  Psychiatric: She has a normal mood and affect. Her behavior is normal. Judgment and thought content normal.  Vitals reviewed.         Assessment & Plan:  General medical exam  Screening breast examination - Plan: MM Digital Screening  Dyslipidemia  Elevated blood pressure reading Patient will receive her flu shot tomorrow at work.  We discussed her labs at great length.  I have recommended a diet rich in fruits and vegetables low in saturated fat.  I have recommended 30 minutes a day 5 days a week of aerobic exercise and 10 pounds  weight loss.  Recheck labs in 6 months.  If blood pressure and lab work remains elevated, we may want to consider possibly adding fish oil.  Pap smear is due next year.  She is not yet due for a colonoscopy.

## 2018-07-25 ENCOUNTER — Encounter: Payer: Self-pay | Admitting: Family Medicine

## 2018-08-01 ENCOUNTER — Ambulatory Visit (INDEPENDENT_AMBULATORY_CARE_PROVIDER_SITE_OTHER): Admitting: Family Medicine

## 2018-08-01 ENCOUNTER — Encounter: Payer: Self-pay | Admitting: Family Medicine

## 2018-08-01 VITALS — BP 116/72 | HR 85 | Temp 98.6°F | Resp 16 | Ht 65.0 in | Wt 165.0 lb

## 2018-08-01 DIAGNOSIS — S99912D Unspecified injury of left ankle, subsequent encounter: Secondary | ICD-10-CM | POA: Diagnosis not present

## 2018-08-01 NOTE — Progress Notes (Signed)
Subjective:    Patient ID: Tracey Malone, female    DOB: 07/30/1976, 42 y.o.   MRN: [id number]  HPI Patient is here today requesting an MRI of her left ankle.  She had an MRI of the left ankle in January of this year which revealed no significant abnormality.  She has not reported any pain or dysfunction in the left ankle recently.  In June, the patient slipped on a wet spot and fell.  Apparently there is pending litigation.  They are wanting to settle the litigation.  Therefore the patient wants an MRI of her ankle prior to settling to ensure that there is no injury that requires further treatment before settling.  Today she has normal dorsiflexion and normal plantarflexion.  She has normal inversion and normal eversion of the ankle.  She has a negative anterior drawer sign of the ankle.  There is no instability on exam.  There is no erythema.  There is no effusion.  There is no visible deformity.  There is no tenderness to palpation over the medial or lateral malleolus.  She has a negative Thompson's test.  There is no pain on palpation of her calcaneus.  There is no evidence of ligamentous instability or decreased range of motion or crepitus with range of motion.  The patient states that occasionally when she stands up in the bathtub, her ankle make a popping sound.  This apparently does not cause any pain. Past Medical History:  Diagnosis Date  . ADD (attention deficit disorder)   . Bell's palsy   . Migraines    Past Surgical History:  Procedure Laterality Date  . HEMORRHOID SURGERY    . INCONTINENCE SURGERY    . TUBAL LIGATION     Current Outpatient Medications on File Prior to Visit  Medication Sig Dispense Refill  . cyclobenzaprine (FLEXERIL) 10 MG tablet Take 1 tablet (10 mg total) by mouth every 8 (eight) hours. 30 tablet 0   No current facility-administered medications on file prior to visit.    Allergies  Allergen Reactions  . Codeine Shortness Of Breath  . Topamax  [Topiramate]     Cognitive Slowing--types same word over and over. Cannot get out correct words when talking.  Colleen Can [Sumatriptan-Naproxen Sodium] Shortness Of Breath and Swelling    Throat gets tight.  . Penicillins Swelling    Swelling unknown  Has patient had a PCN reaction causing immediate rash, facial/tongue/throat swelling, SOB or lightheadedness with hypotension: Unknown Has patient had a PCN reaction causing severe rash involving mucus membranes or skin necrosis: No  Has patient had a PCN reaction that required hospitalization: No  Has patient had a PCN reaction occurring within the last 10 years: No  If all of the above answers are "NO", then may proceed with Cep   Social History   Socioeconomic History  . Marital status: Married    Spouse name: Not on file  . Number of children: Not on file  . Years of education: Not on file  . Highest education level: Not on file  Occupational History  . Not on file  Social Needs  . Financial resource strain: Not on file  . Food insecurity:    Worry: Not on file    Inability: Not on file  . Transportation needs:    Medical: Not on file    Non-medical: Not on file  Tobacco Use  . Smoking status: Never Smoker  . Smokeless tobacco: Never Used  Substance and Sexual  Activity  . Alcohol use: No  . Drug use: No  . Sexual activity: Yes    Birth control/protection: Surgical    Comment: Married to [employer], two kids.  Retired from [employer].  Lifestyle  . Physical activity:    Days per week: Not on file    Minutes per session: Not on file  . Stress: Not on file  Relationships  . Social connections:    Talks on phone: Not on file    Gets together: Not on file    Attends religious service: Not on file    Active member of club or organization: Not on file    Attends meetings of clubs or organizations: Not on file    Relationship status: Not on file  . Intimate partner violence:    Fear of current or ex partner: Not on file     Emotionally abused: Not on file    Physically abused: Not on file    Forced sexual activity: Not on file  Other Topics Concern  . Not on file  Social History Narrative  . Not on file      Review of Systems  All other systems reviewed and are negative.      Objective:   Physical Exam  Cardiovascular: Normal rate and regular rhythm.  Pulmonary/Chest: Effort normal and breath sounds normal.  Musculoskeletal:       Left ankle: She exhibits normal range of motion, no swelling, no ecchymosis, no deformity and normal pulse. No lateral malleolus, no medial malleolus, no AITFL, no CF ligament, no posterior TFL, no head of 5th metatarsal and no proximal fibula tenderness found. Achilles tendon normal.  Vitals reviewed.         Assessment & Plan:  Injury of left ankle, subsequent encounter  The exam today of her left ankle appears normal.  I do not see an indication that warrants an MRI.  The patient denies any pain.  There is no evidence of decreased range of motion.  There is no objective findings of instability.  Therefore I have recommended against an MRI.  If the patient has pain or would request a second opinion, I will be glad to refer her to an orthopedist however at the present time I reassured the patient that her ankle exam appears normal

## 2018-08-15 ENCOUNTER — Encounter (HOSPITAL_COMMUNITY): Payer: Self-pay | Admitting: Emergency Medicine

## 2018-08-15 ENCOUNTER — Emergency Department (HOSPITAL_COMMUNITY)
Admission: EM | Admit: 2018-08-15 | Discharge: 2018-08-15 | Disposition: A | Discharge status: Home / Self Care | Attending: Emergency Medicine | Admitting: Emergency Medicine

## 2018-08-15 ENCOUNTER — Emergency Department (HOSPITAL_COMMUNITY)

## 2018-08-15 ENCOUNTER — Other Ambulatory Visit: Payer: Self-pay

## 2018-08-15 DIAGNOSIS — R0981 Nasal congestion: Secondary | ICD-10-CM

## 2018-08-15 DIAGNOSIS — R05 Cough: Secondary | ICD-10-CM | POA: Diagnosis present

## 2018-08-15 DIAGNOSIS — F909 Attention-deficit hyperactivity disorder, unspecified type: Secondary | ICD-10-CM | POA: Diagnosis not present

## 2018-08-15 DIAGNOSIS — R059 Cough, unspecified: Secondary | ICD-10-CM

## 2018-08-15 MED ORDER — BENZONATATE 100 MG PO CAPS: 100.0000 mg | ORAL_CAPSULE | Freq: Three times a day (TID) | ORAL | 0 refills | Status: DC | PRN

## 2018-08-15 MED ORDER — AZITHROMYCIN 250 MG PO TABS: ORAL_TABLET | ORAL | 0 refills | Status: DC

## 2018-08-15 NOTE — Discharge Instructions (Signed)
It was our pleasure to provide your ER care today - we hope that you feel better.  Take zithromax as prescribed (antibiotic).  Take tessalon as need for cough.  You may try claritin-d or zyrtec-d as need for sinus congestion.  Follow up with primary care doctor in 1-2 weeks if symptoms fail to improve/resolve.

## 2018-08-15 NOTE — ED Provider Notes (Signed)
[facility] EMERGENCY DEPARTMENT Provider Note   CSN: [id number] Arrival date & time: 08/15/18  4098     History   Chief Complaint Chief Complaint  Patient presents with  . Cough    HPI Tracey Malone is a 42 y.o. female.  Patient c/o prod cough in past 1-2 weeks. Symptoms acute onset, persistent, episodic, slowly worse. Occasionally prod yellowish phlegm. Denies sore throat. +recent nasal congestion. No known ill contacts. Non smoker. No fever. Denies abd pain or nvd. No headache, or neck pain/stiffness.   The history is provided by the patient.  Cough  Pertinent negatives include no chest pain, no headaches, no sore throat, no shortness of breath and no eye redness.    Past Medical History:  Diagnosis Date  . ADD (attention deficit disorder)   . Bell's palsy   . Migraines     Patient Active Problem List   Diagnosis Date Noted  . Pain in right ankle and joints of right foot 02/14/2017  . 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 Bell's palsy   . UTI (urinary tract infection) 06/27/2013  . GERD (gastroesophageal reflux disease) 06/27/2013  . ADD (attention deficit disorder) 06/27/2013  . Migraines     Past Surgical History:  Procedure Laterality Date  . HEMORRHOID SURGERY    . INCONTINENCE SURGERY    . TUBAL LIGATION       OB History   None      Home Medications    Prior to Admission medications   Medication Sig Start Date End Date Taking? Authorizing Provider  cyclobenzaprine (FLEXERIL) 10 MG tablet Take 1 tablet (10 mg total) by mouth every 8 (eight) hours. 12/19/17   Donita Brooks, MD    Family History Family History  Problem Relation Age of Onset  . COPD Father     Social History Social History   Tobacco Use  . Smoking status: Never Smoker  . Smokeless tobacco: Never Used  Substance Use Topics  . Alcohol use: No  . Drug use: No     Allergies   Codeine; Topamax [topiramate];  Treximet [sumatriptan-naproxen sodium]; and Penicillins   Review of Systems Review of Systems  Constitutional: Negative for fever.  HENT: Positive for congestion. Negative for sore throat.   Eyes: Negative for redness.  Respiratory: Positive for cough. Negative for shortness of breath.   Cardiovascular: Negative for chest pain.  Gastrointestinal: Negative for abdominal pain, diarrhea and vomiting.  Genitourinary: Negative for flank pain.  Musculoskeletal: Negative for neck pain and neck stiffness.  Skin: Negative for rash.  Neurological: Negative for headaches.  Hematological: Does not bruise/bleed easily.  Psychiatric/Behavioral: Negative for confusion.     Physical Exam Updated Vital Signs BP 130/83   Pulse 100   Ht 1.651 m (5\' 5" )   Wt 71.7 kg   LMP 08/08/2018 (Approximate)   SpO2 98%   BMI 26.29 kg/m   Physical Exam  Constitutional: She appears well-developed and well-nourished.  HENT:  Right Ear: External ear normal.  Left Ear: External ear normal.  Mouth/Throat: Oropharynx is clear and moist.  Eyes: Pupils are equal, round, and reactive to light. Conjunctivae are normal. No scleral icterus.  Neck: Neck supple. No tracheal deviation present.  No stiffness or rigidity.   Cardiovascular: Normal rate, regular rhythm, normal heart sounds and intact distal pulses. Exam reveals no gallop and no friction rub.  No murmur heard. Pulmonary/Chest: Effort normal and breath sounds normal. No respiratory distress.  Abdominal: Soft. Normal appearance and bowel sounds are normal. She exhibits no distension. There is no tenderness.  Musculoskeletal: She exhibits no edema.  Neurological: She is alert.  Skin: Skin is warm and dry. No rash noted.  Psychiatric: She has a normal mood and affect.  Nursing note and vitals reviewed.    ED Treatments / Results  Labs (all labs ordered are listed, but only abnormal results are displayed) Labs Reviewed - No data to  display  EKG None  Radiology Dg Chest 2 View  Result Date: 08/15/2018 CLINICAL DATA:  Productive cough, midline chest pain since November 4th. Had undergone dental crown work on October 26 and was treated for sinus infection. EXAM: CHEST - 2 VIEW COMPARISON:  None. FINDINGS: The lungs are well-expanded and clear. The heart and pulmonary vascularity are normal. The mediastinum is normal in width. There is no pleural effusion. The trachea is midline. The bony thorax exhibits no acute abnormality. IMPRESSION: There is no pneumonia nor other active cardiopulmonary disease. Electronically Signed   By: David  SwazilandJordan M.D.   On: 08/15/2018 09:38    Procedures Procedures (including critical care time)  Medications Ordered in ED Medications - No data to display   Initial Impression / Assessment and Plan / ED Course  I have reviewed the triage vital signs and the nursing notes.  Pertinent labs & imaging results that were available during my care of the patient were reviewed by me and considered in my medical decision making (see chart for details).  Chest xray.  Reviewed xray -  No infiltrate on cxr.   Reviewed nursing notes and prior charts for additional history.   Rhonchi left base ?early pna vs bronchitis. abx rx.     Final Clinical Impressions(s) / ED Diagnoses   Final diagnoses:  None    ED Discharge Orders    None       Cathren LaineSteinl, Javarious Elsayed, MD 08/15/18 [phone]

## 2018-08-15 NOTE — ED Notes (Signed)
EDP Steinl at bedside. 

## 2018-08-15 NOTE — ED Notes (Signed)
Patient transported to X-ray 

## 2018-08-15 NOTE — ED Notes (Signed)
Patient returned to unit. No distress observed. 

## 2018-08-15 NOTE — ED Triage Notes (Signed)
Patient presents with complaints of productive cough and nasal drainage since 11/5. Reports right facial pain from possible sinus infection. Denies shortness of breath. Upper chest pain with coughing.

## 2018-08-24 ENCOUNTER — Ambulatory Visit
Admission: RE | Admit: 2018-08-24 | Discharge: 2018-08-24 | Disposition: A | Discharge status: Home / Self Care | Source: Ambulatory Visit | Attending: Family Medicine | Admitting: Family Medicine

## 2018-08-24 DIAGNOSIS — Z1239 Encounter for other screening for malignant neoplasm of breast: Secondary | ICD-10-CM

## 2018-09-20 ENCOUNTER — Encounter (HOSPITAL_COMMUNITY): Payer: Self-pay | Admitting: Emergency Medicine

## 2018-09-20 ENCOUNTER — Other Ambulatory Visit: Payer: Self-pay | Admitting: Family Medicine

## 2018-09-20 ENCOUNTER — Other Ambulatory Visit: Payer: Self-pay

## 2018-09-20 ENCOUNTER — Emergency Department (HOSPITAL_COMMUNITY)
Admission: EM | Admit: 2018-09-20 | Discharge: 2018-09-21 | Disposition: A | Discharge status: Home / Self Care | Attending: Emergency Medicine | Admitting: Emergency Medicine

## 2018-09-20 DIAGNOSIS — R0789 Other chest pain: Secondary | ICD-10-CM | POA: Insufficient documentation

## 2018-09-20 DIAGNOSIS — R079 Chest pain, unspecified: Secondary | ICD-10-CM | POA: Diagnosis present

## 2018-09-20 DIAGNOSIS — Z79899 Other long term (current) drug therapy: Secondary | ICD-10-CM | POA: Diagnosis not present

## 2018-09-20 NOTE — ED Provider Notes (Signed)
TIME SEEN: 11:51 PM  CHIEF COMPLAINT: Left-sided chest pain  HPI: Patient is a 42 year old female with history of Bell's palsy, migraines who presents to the emergency department with left lateral chest pain.  States pain is worse with palpation to her left ribs and deep inspiration.  Had URI symptoms approximately 1 month ago that resolved after approximately 1 week.  No recent fevers, cough, injury to her ribs.  No history of PE, DVT, exogenous estrogen use, recent fractures, surgery, trauma, hospitalization or prolonged travel. No lower extremity swelling or pain. No calf tenderness.  No history of CAD.  No nausea, dizziness, diaphoresis.  Pain does make her feel short of breath.  Took ibuprofen prior to arrival.  ROS: See HPI Constitutional:  fever  Eyes: no drainage  ENT: no runny nose   Cardiovascular:   chest pain  Resp:  SOB  GI: no vomiting GU: no dysuria Integumentary: no rash  Allergy: no hives  Musculoskeletal: no leg swelling  Neurological: no slurred speech ROS otherwise negative  PAST MEDICAL HISTORY/PAST SURGICAL HISTORY:  Past Medical History:  Diagnosis Date  . ADD (attention deficit disorder)   . Bell's palsy   . Migraines     MEDICATIONS:  Prior to Admission medications   Medication Sig Start Date End Date Taking? Authorizing Provider  azithromycin (ZITHROMAX Z-PAK) 250 MG tablet Take as directed. 2 tablets as a single dose on day 1, then one tablet a day for the next 4 days. 08/15/18   Cathren LaineSteinl, Kevin, MD  benzonatate (TESSALON) 100 MG capsule Take 1 capsule (100 mg total) by mouth 3 (three) times daily as needed for cough. 08/15/18   Cathren LaineSteinl, Kevin, MD  cyclobenzaprine (FLEXERIL) 10 MG tablet Take 1 tablet (10 mg total) by mouth every 8 (eight) hours. 12/19/17   Donita BrooksPickard, Warren T, MD    ALLERGIES:  Allergies  Allergen Reactions  . Codeine Shortness Of Breath  . Topamax [Topiramate]     Cognitive Slowing--types same word over and over. Cannot get out correct  words when talking.  Colleen Can. Treximet [Sumatriptan-Naproxen Sodium] Shortness Of Breath and Swelling    Throat gets tight.  . Penicillins Swelling    Swelling unknown  Has patient had a PCN reaction causing immediate rash, facial/tongue/throat swelling, SOB or lightheadedness with hypotension: Unknown Has patient had a PCN reaction causing severe rash involving mucus membranes or skin necrosis: No  Has patient had a PCN reaction that required hospitalization: No  Has patient had a PCN reaction occurring within the last 10 years: No  If all of the above answers are "NO", then may proceed with Cep    SOCIAL HISTORY:  Social History   Tobacco Use  . Smoking status: Never Smoker  . Smokeless tobacco: Never Used  Substance Use Topics  . Alcohol use: No    FAMILY HISTORY: Family History  Problem Relation Age of Onset  . COPD Father     EXAM: BP 137/89 (BP Location: Left Arm)   Pulse (!) 106   Temp 98.4 F (36.9 C) (Oral)   Resp 16   Ht 5\' 4"  (1.626 m)   Wt 68 kg   LMP 08/30/2018 (Approximate)   SpO2 100%   BMI 25.75 kg/m  CONSTITUTIONAL: Alert and oriented and responds appropriately to questions. Well-appearing; well-nourished HEAD: Normocephalic EYES: Conjunctivae clear, pupils appear equal, EOMI ENT: normal nose; moist mucous membranes NECK: Supple, no meningismus, no nuchal rigidity, no LAD  CARD: Regular and tachycardic; S1 and S2 appreciated; no murmurs,  no clicks, no rubs, no gallops RESP: Normal chest excursion without splinting or tachypnea; breath sounds clear and equal bilaterally; no wheezes, no rhonchi, no rales, no hypoxia or respiratory distress, speaking full sentences CHEST:  Chest wall is  tender to palpation over the left lateral chest wall.  No crepitus, ecchymosis, erythema, warmth, rash or other lesions present.   ABD/GI: Normal bowel sounds; non-distended; soft, non-tender, no rebound, no guarding, no peritoneal signs, no hepatosplenomegaly BACK:  The back  appears normal and is non-tender to palpation, there is no CVA tenderness EXT: Normal ROM in all joints; non-tender to palpation; no edema; normal capillary refill; no cyanosis, no calf tenderness or swelling    SKIN: Normal color for age and race; warm; no rash NEURO: Moves all extremities equally PSYCH: The patient's mood and manner are appropriate. Grooming and personal hygiene are appropriate.  MEDICAL DECISION MAKING: Patient here with left rib pain.  Reproducible palpation over the left lateral chest wall.  No rash or sign of shingles, cellulitis on examination.  No history of injury.  She does feel very warm to touch and has oral temperature of 99.1.  Could be the beginning of pneumonia.  She is tachycardic here.  Also concerned about pulmonary embolus although no other risk factors.  Low suspicion for ACS or dissection.  EKG shows no ischemic changes.  Will obtain labs, chest x-ray.  She declines anything for pain at this time.  ED PROGRESS: Patient's work-up has been unremarkable.  No leukocytosis.  Negative troponin and negative d-dimer.  Chest x-ray clear with no rib fracture, pneumonia, effusion, pneumothorax.  Suspect chest wall pain, possible pleurisy.  Recommended alternating Tylenol and Motrin for pain.  She declines any stronger pain medication at this time.  We discussed return precautions.  Has outpatient PCP follow-up.   At this time, I do not feel there is any life-threatening condition present. I have reviewed and discussed all results (EKG, imaging, lab, urine as appropriate) and exam findings with patient/family. I have reviewed nursing notes and appropriate previous records.  I feel the patient is safe to be discharged home without further emergent workup and can continue workup as an outpatient as needed. Discussed usual and customary return precautions. Patient/family verbalize understanding and are comfortable with this plan.  Outpatient follow-up has been provided as needed.  All questions have been answered.       EKG Interpretation  Date/Time:  Wednesday September 20 2018 23:39:06 EST Ventricular Rate:  105 PR Interval:    QRS Duration: 109 QT Interval:  328 QTC Calculation: 434 R Axis:   96 Text Interpretation:  Sinus tachycardia Borderline right axis deviation Low voltage, precordial leads Borderline T abnormalities, anterior leads No significant change since last tracing Confirmed by Rochele Raring [phone]) on 09/20/2018 11:43:26 PM         Ward, Layla Maw, DO 09/21/18 0221

## 2018-09-20 NOTE — ED Triage Notes (Signed)
Pt c/o L lower lateral rib cage pain for about a week, growing incr worse. Pain worse with deep inspiration. Recent cough/cold but states she's recovered from that. No other associated s/sx.

## 2018-09-20 NOTE — Telephone Encounter (Signed)
Ok to refill 

## 2018-09-21 ENCOUNTER — Emergency Department (HOSPITAL_COMMUNITY)

## 2018-09-21 LAB — CBC WITH DIFFERENTIAL/PLATELET
ABS IMMATURE GRANULOCYTES: 0.03 10*3/uL (ref 0.00–0.07)
Basophils Absolute: 0.1 10*3/uL (ref 0.0–0.1)
Basophils Relative: 1 %
EOS PCT: 1 %
Eosinophils Absolute: 0.1 10*3/uL (ref 0.0–0.5)
HCT: 38.2 % (ref 36.0–46.0)
HEMOGLOBIN: 12.7 g/dL (ref 12.0–15.0)
Immature Granulocytes: 0 %
Lymphocytes Relative: 29 %
Lymphs Abs: 2.9 10*3/uL (ref 0.7–4.0)
MCH: 29.7 pg (ref 26.0–34.0)
MCHC: 33.2 g/dL (ref 30.0–36.0)
MCV: 89.3 fL (ref 80.0–100.0)
MONO ABS: 0.7 10*3/uL (ref 0.1–1.0)
MONOS PCT: 7 %
NEUTROS ABS: 6 10*3/uL (ref 1.7–7.7)
Neutrophils Relative %: 62 %
Platelets: 359 10*3/uL (ref 150–400)
RBC: 4.28 MIL/uL (ref 3.87–5.11)
RDW: 13.1 % (ref 11.5–15.5)
WBC: 9.7 10*3/uL (ref 4.0–10.5)
nRBC: 0 % (ref 0.0–0.2)

## 2018-09-21 LAB — BASIC METABOLIC PANEL
Anion gap: 10 (ref 5–15)
BUN: 14 mg/dL (ref 6–20)
CHLORIDE: 104 mmol/L (ref 98–111)
CO2: 24 mmol/L (ref 22–32)
CREATININE: 0.93 mg/dL (ref 0.44–1.00)
Calcium: 9.4 mg/dL (ref 8.9–10.3)
GFR calc Af Amer: >60 mL/min (ref >60)
GFR calc non Af Amer: >60 mL/min (ref >60)
GLUCOSE: 104 mg/dL — AB (ref 70–99)
POTASSIUM: 3.7 mmol/L (ref 3.5–5.1)
SODIUM: 138 mmol/L (ref 135–145)

## 2018-09-21 LAB — I-STAT BETA HCG BLOOD, ED (MC, WL, AP ONLY)

## 2018-09-21 LAB — I-STAT TROPONIN, ED: Troponin i, poc: 0 ng/mL (ref 0.00–0.08)

## 2018-09-21 LAB — D-DIMER, QUANTITATIVE: D-Dimer, Quant: 0.38 ug/mL-FEU (ref 0.00–0.50)

## 2018-09-21 MED ORDER — CYCLOBENZAPRINE HCL 10 MG PO TABS: 10.0000 mg | ORAL_TABLET | Freq: Three times a day (TID) | ORAL | 0 refills | Status: DC

## 2018-09-21 NOTE — Discharge Instructions (Signed)
You may alternate Tylenol 1000 mg every 6 hours as needed for pain and Ibuprofen 800 mg every 8 hours as needed for pain.  Please take Ibuprofen with food. ° °

## 2018-09-21 NOTE — ED Notes (Signed)
Patient transported to X-ray 

## 2018-09-21 NOTE — ED Notes (Signed)
Reviewed d/c instructions with pt, who verbalized understanding and had no outstanding questions. Pt departed in NAD, refused use of wheelchair.   

## 2018-11-02 ENCOUNTER — Other Ambulatory Visit: Payer: Self-pay | Admitting: Family Medicine

## 2018-11-03 NOTE — Telephone Encounter (Signed)
Requesting refill  Flexeril  LOV: 08/01/18  LRF:  09/21/18

## 2018-11-04 IMAGING — CR DG CERVICAL SPINE COMPLETE 4+V
6 series · 6 of 6 positions shown · non-contrast
Comparison: None.

CLINICAL DATA: Posterior left neck pain since 09/07/2016, limited
extension

EXAM:
CERVICAL SPINE - COMPLETE 4+ VIEW

[w c-spine lat]
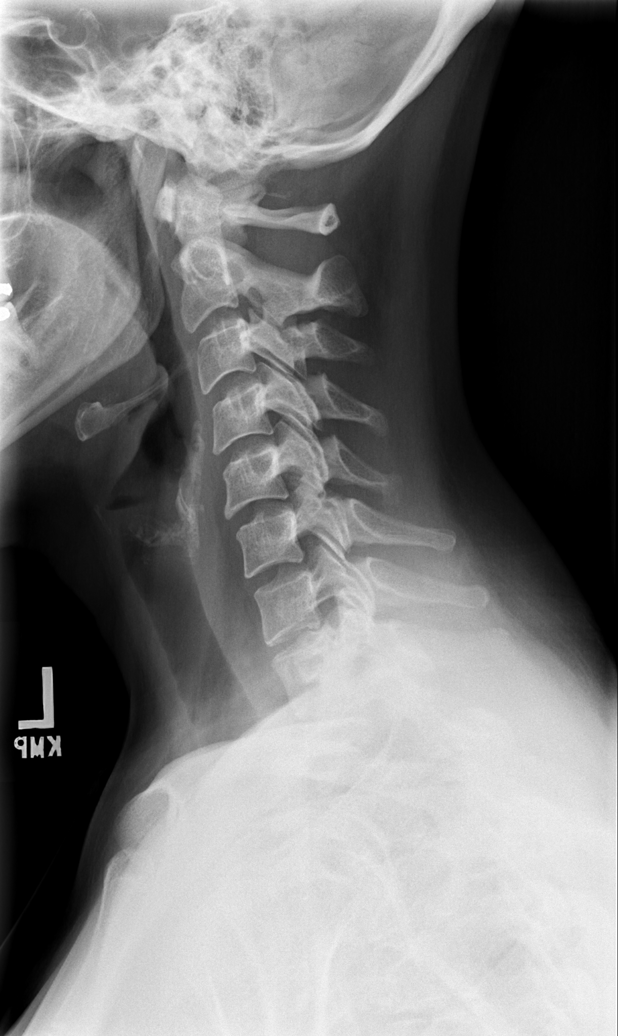

[w c-spine oblique (1 of 3)]
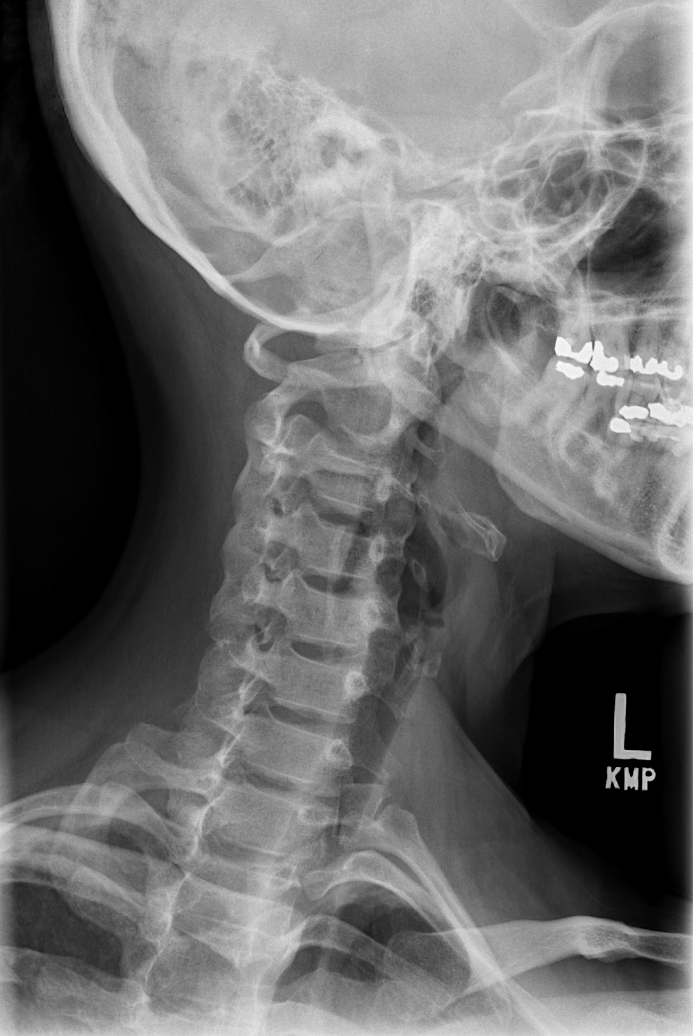

[w c-spine oblique (2 of 3)]
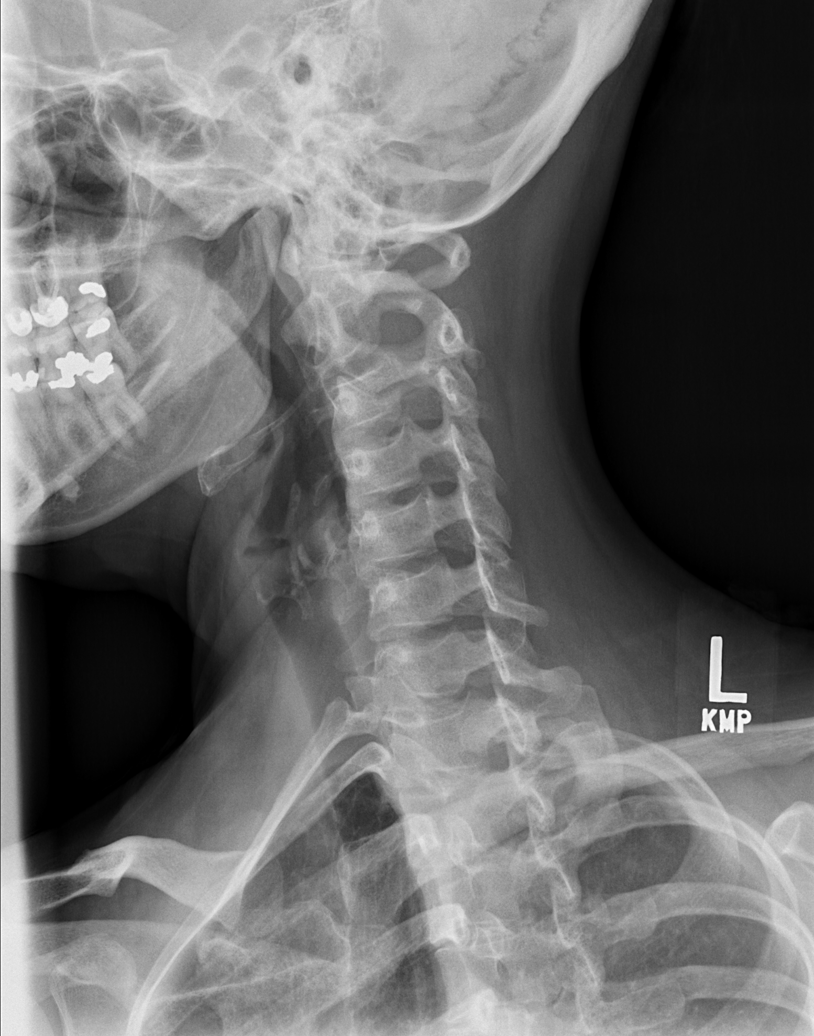

[w c-spine oblique (3 of 3)]
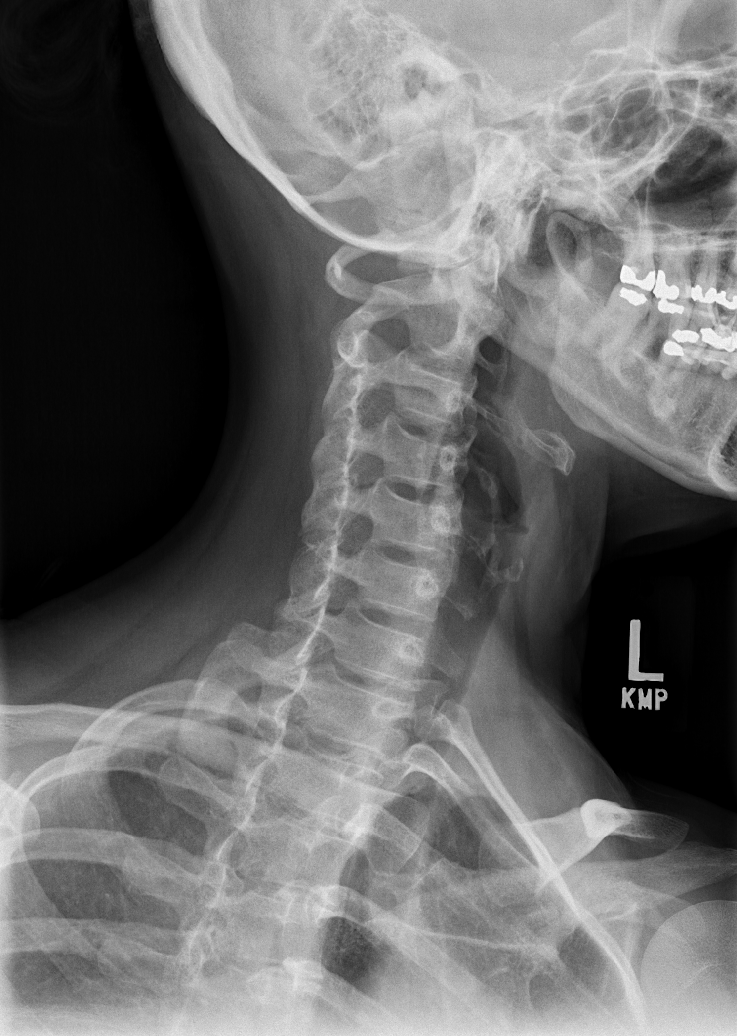

[w c-spine a.p. *]
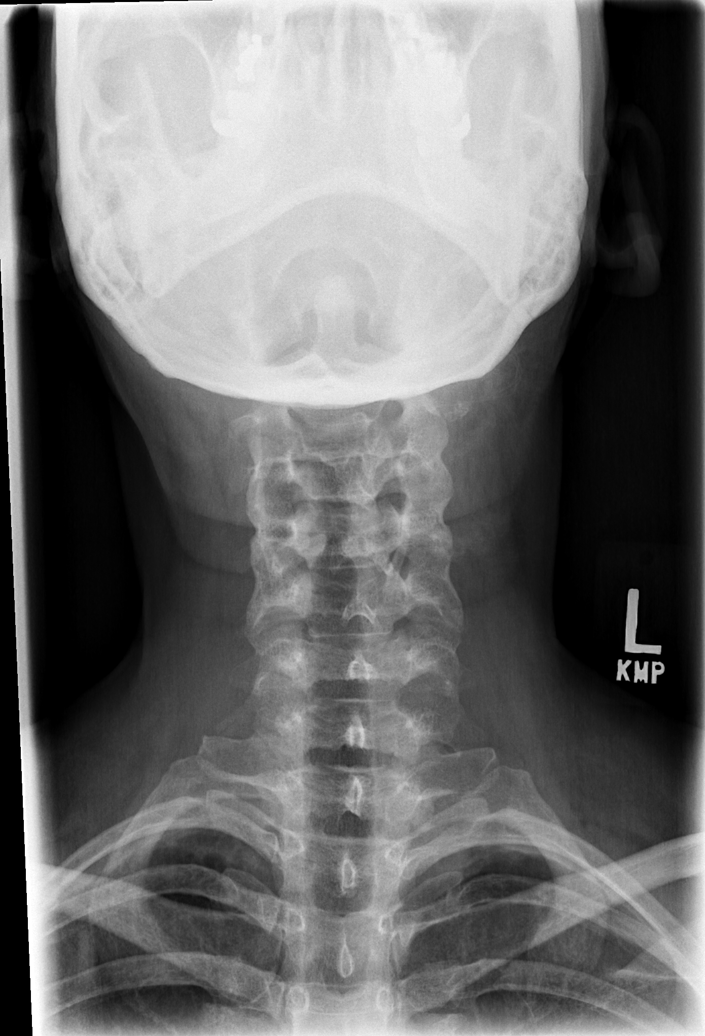

[w c-spine odontoid *]
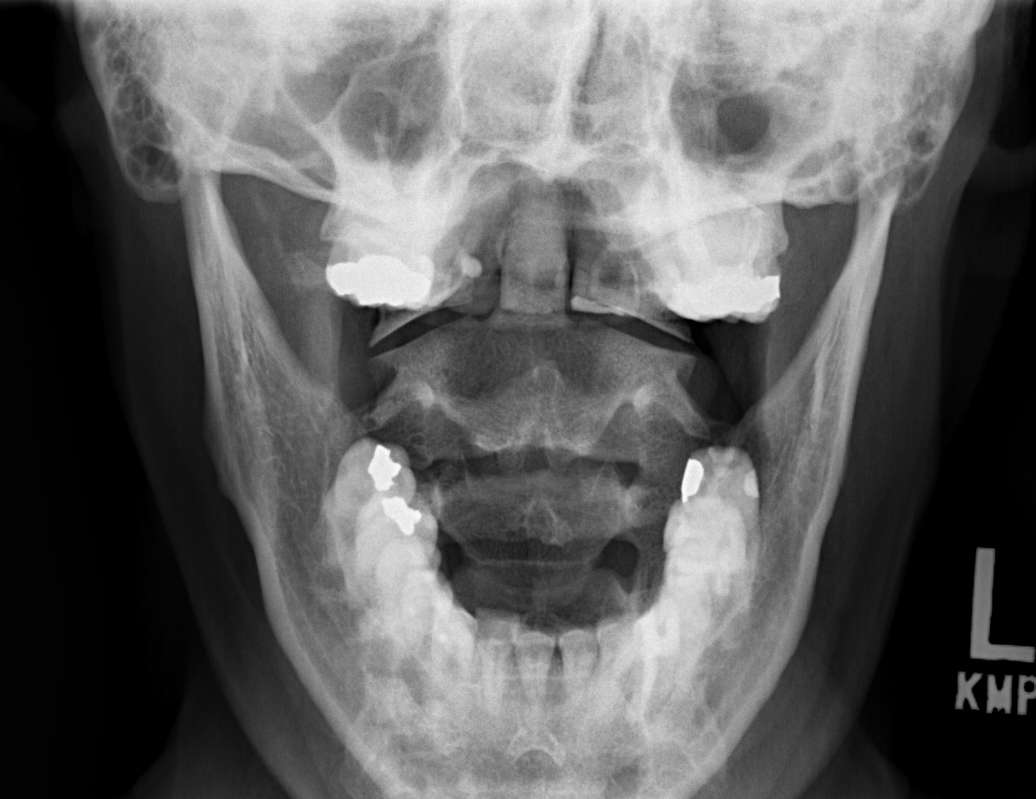

[6 of 6 positions shown; findings below may reference images not displayed]

FINDINGS: There is no evidence of cervical spine fracture or prevertebral soft
tissue swelling. Alignment is normal. No other significant bone
abnormalities are identified.
IMPRESSION: Negative cervical spine radiographs.

## 2018-12-14 ENCOUNTER — Other Ambulatory Visit: Payer: Self-pay | Admitting: Family Medicine

## 2018-12-14 MED ORDER — CYCLOBENZAPRINE HCL 10 MG PO TABS
10.0000 mg | ORAL_TABLET | Freq: Three times a day (TID) | ORAL | 0 refills | Status: DC | PRN
Start: 2018-12-14 — End: 2019-01-31

## 2018-12-14 NOTE — Telephone Encounter (Signed)
Ok to refill 

## 2019-01-31 ENCOUNTER — Other Ambulatory Visit: Payer: Self-pay | Admitting: Family Medicine

## 2019-01-31 NOTE — Telephone Encounter (Signed)
Requesting refill    Flexeril  LOV: 08/01/18  LRF:  12/14/18

## 2019-02-01 MED ORDER — CYCLOBENZAPRINE HCL 10 MG PO TABS
10.0000 mg | ORAL_TABLET | Freq: Three times a day (TID) | ORAL | 0 refills | Status: DC | PRN
Start: 2019-02-01 — End: 2019-04-30

## 2019-02-07 ENCOUNTER — Encounter: Payer: Self-pay | Admitting: Family Medicine

## 2019-03-19 ENCOUNTER — Encounter: Payer: Self-pay | Admitting: Physician Assistant

## 2019-03-19 ENCOUNTER — Telehealth: Admitting: Physician Assistant

## 2019-03-19 DIAGNOSIS — N390 Urinary tract infection, site not specified: Secondary | ICD-10-CM

## 2019-03-19 MED ORDER — NITROFURANTOIN MONOHYD MACRO 100 MG PO CAPS: 100.0000 mg | ORAL_CAPSULE | Freq: Two times a day (BID) | ORAL | 0 refills | Status: DC

## 2019-03-19 NOTE — Progress Notes (Signed)
We are sorry that you are not feeling well.  Here is how we plan to help!  Based on what you shared with me it looks like you most likely have a simple urinary tract infection.  A UTI (Urinary Tract Infection) is a bacterial infection of the bladder.  Most cases of urinary tract infections are simple to treat but a key part of your care is to encourage you to drink plenty of fluids and watch your symptoms carefully.  I have prescribed MacroBid 100 mg twice a day for 5 days.  Your symptoms should gradually improve. Call us if the burning in your urine worsens, you develop worsening fever, back pain or pelvic pain or if your symptoms do not resolve after completing the antibiotic.  Urinary tract infections can be prevented by drinking plenty of water to keep your body hydrated.  Also be sure when you wipe, wipe from front to back and don't hold it in!  If possible, empty your bladder every 4 hours.  Your e-visit answers were reviewed by a board certified advanced clinical practitioner to complete your personal care plan.  Depending on the condition, your plan could have included both over the counter or prescription medications.  If there is a problem please reply  once you have received a response from your provider.  Your safety is important to us.  If you have drug allergies check your prescription carefully.    You can use MyChart to ask questions about today's visit, request a non-urgent call back, or ask for a work or school excuse for 24 hours related to this e-Visit. If it has been greater than 24 hours you will need to follow up with your provider, or enter a new e-Visit to address those concerns.   You will get an e-mail in the next two days asking about your experience.  I hope that your e-visit has been valuable and will speed your recovery. Thank you for using e-visits.   I spent 5-10 minutes on review and completion of this note- Kamyrah Feeser PAC  

## 2019-04-30 ENCOUNTER — Encounter: Payer: Self-pay | Admitting: Family Medicine

## 2019-04-30 ENCOUNTER — Other Ambulatory Visit: Payer: Self-pay | Admitting: Family Medicine

## 2019-04-30 DIAGNOSIS — M25571 Pain in right ankle and joints of right foot: Secondary | ICD-10-CM

## 2019-04-30 MED ORDER — CYCLOBENZAPRINE HCL 10 MG PO TABS: 10.0000 mg | ORAL_TABLET | Freq: Three times a day (TID) | ORAL | 0 refills | Status: DC | PRN

## 2019-04-30 NOTE — Telephone Encounter (Signed)
Ok to refill 

## 2019-05-09 ENCOUNTER — Encounter: Payer: Self-pay | Admitting: Podiatry

## 2019-05-09 ENCOUNTER — Other Ambulatory Visit: Payer: Self-pay

## 2019-05-09 ENCOUNTER — Ambulatory Visit (INDEPENDENT_AMBULATORY_CARE_PROVIDER_SITE_OTHER)

## 2019-05-09 ENCOUNTER — Ambulatory Visit

## 2019-05-09 ENCOUNTER — Ambulatory Visit (INDEPENDENT_AMBULATORY_CARE_PROVIDER_SITE_OTHER): Admitting: Podiatry

## 2019-05-09 ENCOUNTER — Other Ambulatory Visit: Payer: Self-pay | Admitting: Podiatry

## 2019-05-09 VITALS — BP 111/70 | HR 89 | Temp 98.4°F

## 2019-05-09 DIAGNOSIS — M25671 Stiffness of right ankle, not elsewhere classified: Secondary | ICD-10-CM

## 2019-05-09 DIAGNOSIS — M659 Synovitis and tenosynovitis, unspecified: Secondary | ICD-10-CM | POA: Diagnosis not present

## 2019-05-09 DIAGNOSIS — M65979 Unspecified synovitis and tenosynovitis, unspecified ankle and foot: Secondary | ICD-10-CM

## 2019-05-12 NOTE — Progress Notes (Signed)
   HPI: 43 y.o. female presenting today with a chief complaint of lateral right ankle stiffness that began several months ago. She reports h/o right ankle surgery in the past. She has not done anything for treatment. Patient reports difficulty with inversion of the ankle. She denies any pain. Patient is here for further evaluation and treatment.   Past Medical History:  Diagnosis Date  . ADD (attention deficit disorder)   . Bell's palsy   . Migraines      Physical Exam: General: The patient is alert and oriented x3 in no acute distress.  Dermatology: Skin is warm, dry and supple bilateral lower extremities. Negative for open lesions or macerations.  Vascular: Palpable pedal pulses bilaterally. No edema or erythema noted. Capillary refill within normal limits.  Neurological: Epicritic and protective threshold grossly intact bilaterally.   Musculoskeletal Exam: Decreased range of motion with inversion of the right ankle. Muscle strength 5/5 in all groups bilateral.   Radiographic Exam:  Normal osseous mineralization. Joint spaces preserved. No fracture/dislocation/boney destruction.    Assessment: 1. H/o [employer] ATFL repair right ankle 2. Ankle stiffness right    Plan of Care:  1. Patient evaluated. X-Rays reviewed.  2. Physical therapy ordered three times weekly for four weeks.  3. Return to clinic as needed.      Edrick Kins, DPM [facility]  Dr. Edrick Kins, DPM    [address], [place] [address]                Office [phone]  Fax [phone]

## 2019-05-16 ENCOUNTER — Telehealth: Payer: Self-pay | Admitting: Podiatry

## 2019-05-16 NOTE — Telephone Encounter (Signed)
Patient has been referred to a physical therapy office but is calling to get clarification on the referral. Patient has Tracey Malone for insurance and states she normally has to have a referral done from the [place] before she can be seen in an office but does not recall that being done for physical therapy. Pt just wanted to make sure everything has been approved.   Can you check into this and see if there is anything else the patient needs before going into therapy.

## 2019-07-05 ENCOUNTER — Other Ambulatory Visit: Payer: Self-pay | Admitting: Family Medicine

## 2019-07-05 NOTE — Telephone Encounter (Signed)
Ok to refill 

## 2019-07-16 ENCOUNTER — Other Ambulatory Visit: Payer: Self-pay | Admitting: Family Medicine

## 2019-07-16 NOTE — Telephone Encounter (Signed)
Last refilled: 07/05/2019 Last office visit: 07/18/18

## 2019-07-26 ENCOUNTER — Other Ambulatory Visit: Payer: Self-pay | Admitting: Family Medicine

## 2019-07-26 NOTE — Telephone Encounter (Signed)
Ok to refill 

## 2019-08-03 ENCOUNTER — Telehealth: Admitting: Physician Assistant

## 2019-08-03 DIAGNOSIS — R399 Unspecified symptoms and signs involving the genitourinary system: Secondary | ICD-10-CM

## 2019-08-03 MED ORDER — NITROFURANTOIN MONOHYD MACRO 100 MG PO CAPS: 100.0000 mg | ORAL_CAPSULE | Freq: Two times a day (BID) | ORAL | 0 refills | Status: DC

## 2019-08-03 NOTE — Progress Notes (Signed)

## 2019-08-07 ENCOUNTER — Other Ambulatory Visit: Payer: Self-pay | Admitting: Family Medicine

## 2019-08-07 NOTE — Telephone Encounter (Signed)
Requesting refill    Flexeril  LOV: 08/01/18  LRF:  07/26/19

## 2019-08-21 ENCOUNTER — Other Ambulatory Visit: Payer: Self-pay | Admitting: Family Medicine

## 2019-08-21 NOTE — Telephone Encounter (Signed)
Ok to refill 

## 2019-10-30 ENCOUNTER — Telehealth: Admitting: Physician Assistant

## 2019-10-30 DIAGNOSIS — R300 Dysuria: Secondary | ICD-10-CM

## 2019-10-30 NOTE — Progress Notes (Signed)
Hi Klaira,  I am sorry you are not feeling well.  This may not be a UTI.  If your symptoms worsen, please go to an urgent care for evaluation.  Otherwise, in order to properly evaluate a possible problem with your mesh, please make an appointment with your PCP or Urologist.   Based on what you shared with me, I feel your condition warrants further evaluation and I recommend that you be seen for a face to face visit.  Please contact your primary care physician practice to be seen. Many offices offer virtual options to be seen via video if you are not comfortable going in person to a medical facility at this time.  If you do not have a PCP, [facility] offers a free physician referral service available at [phone]. Our trained staff has the experience, knowledge and resources to put you in touch with a physician who is right for you.   You also have the option of a video visit through [email or web address]  If you are having a true medical emergency please call 911.  NOTE: If you entered your credit card information for this eVisit, you will not be charged. You may see a "hold" on your card for the $35 but that hold will drop off and you will not have a charge processed.  Your e-visit answers were reviewed by a board certified advanced clinical practitioner to complete your personal care plan.  Thank you for using e-Visits.

## 2019-11-09 ENCOUNTER — Other Ambulatory Visit: Payer: Self-pay | Admitting: Family Medicine

## 2019-11-09 NOTE — Telephone Encounter (Signed)
Requesting refill    Flexeril  LOV:  08/01/18   LRF:  08/21/19

## 2019-12-10 ENCOUNTER — Encounter: Payer: Self-pay | Admitting: Family Medicine

## 2019-12-11 ENCOUNTER — Ambulatory Visit: Admitting: Family Medicine

## 2019-12-12 ENCOUNTER — Other Ambulatory Visit: Payer: Self-pay

## 2019-12-12 ENCOUNTER — Other Ambulatory Visit

## 2019-12-12 DIAGNOSIS — K219 Gastro-esophageal reflux disease without esophagitis: Secondary | ICD-10-CM

## 2019-12-12 DIAGNOSIS — E785 Hyperlipidemia, unspecified: Secondary | ICD-10-CM

## 2019-12-12 LAB — COMPREHENSIVE METABOLIC PANEL
AG Ratio: 1.8 (calc) (ref 1.0–2.5)
ALT: 29 U/L (ref 6–29)
AST: 21 U/L (ref 10–35)
Albumin: 4.3 g/dL (ref 3.6–5.1)
Alkaline phosphatase (APISO): 62 U/L (ref 31–125)
BUN: 17 mg/dL (ref 7–25)
CO2: 27 mmol/L (ref 20–32)
Calcium: 9.3 mg/dL (ref 8.6–10.2)
Chloride: 104 mmol/L (ref 98–110)
Creat: 0.86 mg/dL (ref 0.50–1.10)
Globulin: 2.4 g/dL (calc) (ref 1.9–3.7)
Glucose, Bld: 88 mg/dL (ref 65–99)
Potassium: 4.8 mmol/L (ref 3.5–5.3)
Sodium: 138 mmol/L (ref 135–146)
Total Bilirubin: 0.4 mg/dL (ref 0.2–1.2)
Total Protein: 6.7 g/dL (ref 6.1–8.1)

## 2019-12-12 LAB — CBC WITH DIFFERENTIAL/PLATELET
Absolute Monocytes: 478 cells/uL (ref 200–950)
Basophils Absolute: 41 cells/uL (ref 0–200)
Basophils Relative: 0.7 %
Eosinophils Absolute: 47 cells/uL (ref 15–500)
Eosinophils Relative: 0.8 %
HCT: 38.6 % (ref 35.0–45.0)
Hemoglobin: 12.8 g/dL (ref 11.7–15.5)
Lymphs Abs: 1782 cells/uL (ref 850–3900)
MCH: 29.9 pg (ref 27.0–33.0)
MCHC: 33.2 g/dL (ref 32.0–36.0)
MCV: 90.2 fL (ref 80.0–100.0)
MPV: 9.2 fL (ref 7.5–12.5)
Monocytes Relative: 8.1 %
Neutro Abs: 3552 cells/uL (ref 1500–7800)
Neutrophils Relative %: 60.2 %
Platelets: 363 10*3/uL (ref 140–400)
RBC: 4.28 10*6/uL (ref 3.80–5.10)
RDW: 13 % (ref 11.0–15.0)
Total Lymphocyte: 30.2 %
WBC: 5.9 10*3/uL (ref 3.8–10.8)

## 2019-12-12 LAB — LIPID PANEL
Cholesterol: 212 mg/dL — ABNORMAL HIGH (ref <200)
HDL: 57 mg/dL (ref >=50)
LDL Cholesterol (Calc): 138 mg/dL (calc) — ABNORMAL HIGH
Non-HDL Cholesterol (Calc): 155 mg/dL (calc) — ABNORMAL HIGH (ref <130)
Total CHOL/HDL Ratio: 3.7 (calc) (ref <5.0)
Triglycerides: 78 mg/dL (ref <150)

## 2019-12-14 ENCOUNTER — Ambulatory Visit (INDEPENDENT_AMBULATORY_CARE_PROVIDER_SITE_OTHER): Admitting: Family Medicine

## 2019-12-14 ENCOUNTER — Other Ambulatory Visit: Payer: Self-pay

## 2019-12-14 VITALS — BP 110/80 | HR 93 | Temp 98.3°F | Resp 18 | Ht 65.0 in | Wt 171.0 lb

## 2019-12-14 DIAGNOSIS — Z1231 Encounter for screening mammogram for malignant neoplasm of breast: Secondary | ICD-10-CM

## 2019-12-14 DIAGNOSIS — F900 Attention-deficit hyperactivity disorder, predominantly inattentive type: Secondary | ICD-10-CM

## 2019-12-14 MED ORDER — METHYLPHENIDATE HCL ER (LA) 10 MG PO CP24: 10.0000 mg | ORAL_CAPSULE | Freq: Every day | ORAL | 0 refills | Status: DC

## 2019-12-14 NOTE — Progress Notes (Signed)
Subjective:    Patient ID: Tracey Malone, female    DOB: [birth year], 44 y.o.   MRN: [id number]  HPI Patient has a history of ADHD.  In the past while she was attending [employer], she required Ritalin long-acting 10 mg p.o. every morning.  Without medication she has difficult time maintaining focus.  She recently started taking a Administrator, arts class.  This started March 1.  She has been doing very poorly the last 2 weeks.  She finds her self easily distracted.  She has a difficult time maintaining focus to review all PowerPoint slides.  She finds her self frequently rereading the same material and not processing the material.  She finds her self easily distracted.  She would like to resume the Ritalin if possible.  She denies any chest pain, shortness of breath, dyspnea on exertion, palpitations.  She is overdue for a Pap smear and a mammogram.  Her most recent lab work is listed below and shows mild elevations in her LDL cholesterol but not sufficient enough to require medication. Past Medical History:  Diagnosis Date  . ADD (attention deficit disorder)   . Bell's palsy   . Migraines    Past Surgical History:  Procedure Laterality Date  . HEMORRHOID SURGERY    . INCONTINENCE SURGERY    . TUBAL LIGATION     Current Outpatient Medications on File Prior to Visit  Medication Sig Dispense Refill  . azithromycin (ZITHROMAX Z-PAK) 250 MG tablet Take as directed. 2 tablets as a single dose on day 1, then one tablet a day for the next 4 days. 6 tablet 0  . benzonatate (TESSALON) 100 MG capsule Take 1 capsule (100 mg total) by mouth 3 (three) times daily as needed for cough. 15 capsule 0  . cyclobenzaprine (FLEXERIL) 10 MG tablet TAKE 1 TABLET(10 MG) BY MOUTH THREE TIMES DAILY AS NEEDED FOR MUSCLE SPASMS 30 tablet 0  . nitrofurantoin, macrocrystal-monohydrate, (MACROBID) 100 MG capsule Take 1 capsule (100 mg total) by mouth 2 (two) times daily. 10 capsule 0  . nitrofurantoin,  macrocrystal-monohydrate, (MACROBID) 100 MG capsule Take 1 capsule (100 mg total) by mouth 2 (two) times daily. 10 capsule 0   No current facility-administered medications on file prior to visit.   Allergies  Allergen Reactions  . Codeine Shortness Of Breath  . Topamax [Topiramate]     Cognitive Slowing--types same word over and over. Cannot get out correct words when talking.  Colleen Can [Sumatriptan-Naproxen Sodium] Shortness Of Breath and Swelling    Throat gets tight.  . Penicillins Swelling    Swelling unknown  Has patient had a PCN reaction causing immediate rash, facial/tongue/throat swelling, SOB or lightheadedness with hypotension: Unknown Has patient had a PCN reaction causing severe rash involving mucus membranes or skin necrosis: No  Has patient had a PCN reaction that required hospitalization: No  Has patient had a PCN reaction occurring within the last 10 years: No  If all of the above answers are "NO", then may proceed with Cep   Social History   Socioeconomic History  . Marital status: Married    Spouse name: Not on file  . Number of children: Not on file  . Years of education: Not on file  . Highest education level: Not on file  Occupational History  . Not on file  Tobacco Use  . Smoking status: Never Smoker  . Smokeless tobacco: Never Used  Substance and Sexual Activity  . Alcohol use: No  .  Drug use: No  . Sexual activity: Yes    Birth control/protection: Surgical    Comment: Married to [employer], two kids.  Retired from [employer].  Other Topics Concern  . Not on file  Social History Narrative  . Not on file   Social Determinants of Health   Financial Resource Strain:   . Difficulty of Paying Living Expenses:   Food Insecurity:   . Worried About Programme researcher, broadcasting/film/video in the Last Year:   . Barista in the Last Year:   Transportation Needs:   . Freight forwarder (Medical):   Marland Kitchen Lack of Transportation (Non-Medical):   Physical Activity:   . Days  of Exercise per Week:   . Minutes of Exercise per Session:   Stress:   . Feeling of Stress :   Social Connections:   . Frequency of Communication with Friends and Family:   . Frequency of Social Gatherings with Friends and Family:   . Attends Religious Services:   . Active Member of Clubs or Organizations:   . Attends Banker Meetings:   Marland Kitchen Marital Status:   Intimate Partner Violence:   . Fear of Current or Ex-Partner:   . Emotionally Abused:   Marland Kitchen Physically Abused:   . Sexually Abused:       Review of Systems  All other systems reviewed and are negative.      Objective:   Physical Exam Vitals reviewed.  Cardiovascular:     Rate and Rhythm: Normal rate and regular rhythm.     Heart sounds: Normal heart sounds. No murmur. No friction rub. No gallop.   Pulmonary:     Effort: Pulmonary effort is normal. No respiratory distress.     Breath sounds: Normal breath sounds. No stridor. No wheezing, rhonchi or rales.  Abdominal:     General: Abdomen is flat. Bowel sounds are normal. There is no distension.     Palpations: Abdomen is soft.     Tenderness: There is no abdominal tenderness.  Musculoskeletal:     Left ankle: No lateral malleolus, medial malleolus, AITF ligament, CF ligament, posterior TF ligament or proximal fibula tenderness.    Lab on 12/12/2019  Component Date Value Ref Range Status  . Glucose, Bld 12/12/2019 88  65 - 99 mg/dL Final   Comment: .            Fasting reference interval .   . BUN 12/12/2019 17  7 - 25 mg/dL Final  . Creat 48/54/6270 0.86  0.50 - 1.10 mg/dL Final  . BUN/Creatinine Ratio 12/12/2019 NOT APPLICABLE  6 - 22 (calc) Final  . Sodium 12/12/2019 138  135 - 146 mmol/L Final  . Potassium 12/12/2019 4.8  3.5 - 5.3 mmol/L Final  . Chloride 12/12/2019 104  98 - 110 mmol/L Final  . CO2 12/12/2019 27  20 - 32 mmol/L Final  . Calcium 12/12/2019 9.3  8.6 - 10.2 mg/dL Final  . Total Protein 12/12/2019 6.7  6.1 - 8.1 g/dL Final  .  Albumin 35/00/9381 4.3  3.6 - 5.1 g/dL Final  . Globulin 82/99/3716 2.4  1.9 - 3.7 g/dL (calc) Final  . AG Ratio 12/12/2019 1.8  1.0 - 2.5 (calc) Final  . Total Bilirubin 12/12/2019 0.4  0.2 - 1.2 mg/dL Final  . Alkaline phosphatase (APISO) 12/12/2019 62  31 - 125 U/L Final  . AST 12/12/2019 21  10 - 35 U/L Final  . ALT 12/12/2019 29  6 - 29  U/L Final  . Cholesterol 12/12/2019 212* <200 mg/dL Final  . HDL 12/12/2019 57  > OR = 50 mg/dL Final  . Triglycerides 12/12/2019 78  <150 mg/dL Final  . LDL Cholesterol (Calc) 12/12/2019 138* mg/dL (calc) Final   Comment: Reference range: <100 . Desirable range <100 mg/dL for primary prevention;   <70 mg/dL for patients with CHD or diabetic patients  with > or = 2 CHD risk factors. Marland Kitchen LDL-C is now calculated using the Martin-Hopkins  calculation, which is a validated novel method providing  better accuracy than the Friedewald equation in the  estimation of LDL-C.  Cresenciano Genre et al. Annamaria Helling. 9323;557(32): 2061-2068  (http://education.[facility].com/faq/FAQ164)   . Total CHOL/HDL Ratio 12/12/2019 3.7  <5.0 (calc) Final  . Non-HDL Cholesterol (Calc) 12/12/2019 155* <130 mg/dL (calc) Final   Comment: For patients with diabetes plus 1 major ASCVD risk  factor, treating to a non-HDL-C goal of <100 mg/dL  (LDL-C of <70 mg/dL) is considered a therapeutic  option.   . WBC 12/12/2019 5.9  3.8 - 10.8 Thousand/uL Final  . RBC 12/12/2019 4.28  3.80 - 5.10 Million/uL Final  . Hemoglobin 12/12/2019 12.8  11.7 - 15.5 g/dL Final  . HCT 12/12/2019 38.6  35.0 - 45.0 % Final  . MCV 12/12/2019 90.2  80.0 - 100.0 fL Final  . MCH 12/12/2019 29.9  27.0 - 33.0 pg Final  . MCHC 12/12/2019 33.2  32.0 - 36.0 g/dL Final  . RDW 12/12/2019 13.0  11.0 - 15.0 % Final  . Platelets 12/12/2019 363  140 - 400 Thousand/uL Final  . MPV 12/12/2019 9.2  7.5 - 12.5 fL Final  . Neutro Abs 12/12/2019 3,552  1,500 - 7,800 cells/uL Final  . Lymphs Abs 12/12/2019 1,782  850 - 3,900  cells/uL Final  . Absolute Monocytes 12/12/2019 478  200 - 950 cells/uL Final  . Eosinophils Absolute 12/12/2019 47  15 - 500 cells/uL Final  . Basophils Absolute 12/12/2019 41  0 - 200 cells/uL Final  . Neutrophils Relative % 12/12/2019 60.2  % Final  . Total Lymphocyte 12/12/2019 30.2  % Final  . Monocytes Relative 12/12/2019 8.1  % Final  . Eosinophils Relative 12/12/2019 0.8  % Final  . Basophils Relative 12/12/2019 0.7  % Final           Assessment & Plan:  Attention deficit hyperactivity disorder (ADHD), predominantly inattentive type  Encounter for screening mammogram for malignant neoplasm of breast - Plan: MM Digital Screening  Resume Ritalin long-acting 10 mg p.o. every morning.  Reassess in 1 month if not beneficial or sooner if worsening.  Meanwhile schedule the patient for mammogram.  Recommended a Pap smear at her earliest convenience.  Encouraged her to try to start exercising 30 minutes a day 5 days a week to help lower cholesterol and reduce her BMI.

## 2020-01-11 ENCOUNTER — Ambulatory Visit

## 2020-01-24 ENCOUNTER — Other Ambulatory Visit: Payer: Self-pay | Admitting: Family Medicine

## 2020-01-24 ENCOUNTER — Other Ambulatory Visit: Payer: Self-pay

## 2020-01-24 ENCOUNTER — Ambulatory Visit
Admission: RE | Admit: 2020-01-24 | Discharge: 2020-01-24 | Disposition: A | Discharge status: Home / Self Care | Source: Ambulatory Visit | Attending: Family Medicine | Admitting: Family Medicine

## 2020-01-24 DIAGNOSIS — Z1231 Encounter for screening mammogram for malignant neoplasm of breast: Secondary | ICD-10-CM

## 2020-01-24 NOTE — Telephone Encounter (Signed)
Ok to refill??  Last office visit 12/14/2019.  Last refill 12/14/2019.

## 2020-01-25 MED ORDER — CYCLOBENZAPRINE HCL 10 MG PO TABS: 10.0000 mg | ORAL_TABLET | Freq: Three times a day (TID) | ORAL | 0 refills | Status: DC | PRN

## 2020-01-25 MED ORDER — METHYLPHENIDATE HCL ER (LA) 10 MG PO CP24: 10.0000 mg | ORAL_CAPSULE | Freq: Every day | ORAL | 0 refills | Status: DC

## 2020-03-01 ENCOUNTER — Ambulatory Visit (INDEPENDENT_AMBULATORY_CARE_PROVIDER_SITE_OTHER)
Admission: RE | Admit: 2020-03-01 | Discharge: 2020-03-01 | Disposition: A | Discharge status: Home / Self Care | Source: Ambulatory Visit

## 2020-03-01 DIAGNOSIS — J029 Acute pharyngitis, unspecified: Secondary | ICD-10-CM

## 2020-03-01 MED ORDER — LIDOCAINE VISCOUS HCL 2 % MT SOLN: 15.0000 mL | OROMUCOSAL | 0 refills | Status: DC | PRN

## 2020-03-01 MED ORDER — PREDNISONE 20 MG PO TABS: 20.0000 mg | ORAL_TABLET | Freq: Two times a day (BID) | ORAL | 0 refills | Status: AC

## 2020-03-01 NOTE — Discharge Instructions (Signed)
Get plenty of rest and push fluids Prednisone prescribed.  Take as directed and to completion Viscous lidocaine prescribed.  This is an oral solution you can swish, and gargle as needed for symptomatic relief of sore throat.  Do not exceed 8 doses in a 24 hour period.  Do not use prior to eating, as this will numb your entire mouth.   Drink warm or cool liquids, use throat lozenges, or popsicles to help alleviate symptoms Take OTC ibuprofen or tylenol as needed for pain Follow up with PCP if symptoms persists Follow up in person or go to ER if patient has any new or worsening symptoms such as fever, chills, nausea, vomiting, worsening sore throat, cough, abdominal pain, chest pain, changes in bowel or bladder habits, etc..Marland Kitchen

## 2020-03-01 NOTE — ED Provider Notes (Signed)
[facility]  Virtual Visit via Video Note:  Tracey Malone  initiated request for Telemedicine visit with [facility] Urgent Care team. I connected with Tracey Malone  on 03/01/2020 at 11:01 AM  for a synchronized telemedicine visit using a video enabled HIPPA compliant telemedicine application. I verified that I am speaking with Tracey Malone  using two identifiers. Rennis Harding, PA-C  was physically located in a [facility] Urgent care site and Tracey Malone was located at a different location.   The limitations of evaluation and management by telemedicine as well as the availability of in-person appointments were discussed. Patient was informed that she  may incur a bill ( including co-pay) for this virtual visit encounter. Tracey Malone  expressed understanding and gave verbal consent to proceed with virtual visit.   [id number] 03/01/20 Arrival Time: 1054  RS:WNIO THROAT  SUBJECTIVE: History from: patient.  Tracey Malone is a 44 y.o. female who presents with abrupt onset of sore throat x 1 day.  Denies sick exposure to strep, flu mono, or COVID exposure, or precipitating event.  Has tried nyquil with minimal relief.  Symptoms are made worse with swallowing, but tolerating liquids and own secretions without difficulty.  Reports previous symptoms in the past.  Complains of associated hoarse voice, runny nose.  Denies fever, chills, fatigue, sinus pain, nasal congestion, cough, SOB, wheezing, chest pain, nausea, vomiting, changes in bowel or bladder habits.    ROS: As per HPI.  All other pertinent ROS negative.     Past Medical History:  Diagnosis Date  . ADD (attention deficit disorder)   . Bell's palsy   . Migraines    Past Surgical History:  Procedure Laterality Date  . HEMORRHOID SURGERY    . INCONTINENCE SURGERY    . TUBAL LIGATION     Allergies  Allergen Reactions  . Codeine Shortness Of Breath  . Topamax [Topiramate]     Cognitive  Slowing--types same word over and over. Cannot get out correct words when talking.  Colleen Can [Sumatriptan-Naproxen Sodium] Shortness Of Breath and Swelling    Throat gets tight.  . Penicillins Swelling    Swelling unknown  Has patient had a PCN reaction causing immediate rash, facial/tongue/throat swelling, SOB or lightheadedness with hypotension: Unknown Has patient had a PCN reaction causing severe rash involving mucus membranes or skin necrosis: No  Has patient had a PCN reaction that required hospitalization: No  Has patient had a PCN reaction occurring within the last 10 years: No  If all of the above answers are "NO", then may proceed with Cep   No current facility-administered medications on file prior to encounter.   Current Outpatient Medications on File Prior to Encounter  Medication Sig Dispense Refill  . methylphenidate (RITALIN LA) 10 MG 24 hr capsule Take 1 capsule (10 mg total) by mouth daily. 30 capsule 0    OBJECTIVE:  There were no vitals filed for this visit.  General appearance: alert; no distress Eyes: EOMI grossly HENT: normocephalic; atraumatic Neck: supple with FROM Lungs: normal respiratory effort; speaking in full sentences without difficulty Extremities: moves extremities without difficulty Skin: No obvious rashes Neurologic: No facial asymmetries Psychological: alert and cooperative; normal mood and affect  ASSESSMENT & PLAN:  1. Sore throat    Meds ordered this encounter  Medications  . predniSONE (DELTASONE) 20 MG tablet    Sig: Take 1 tablet (20 mg total) by mouth 2 (two) times daily with a meal  for 5 days.    Dispense:  10 tablet    Refill:  0    Order Specific Question:   Supervising Provider    Answer:   Raylene Everts [[id number]]  . lidocaine (XYLOCAINE) 2 % solution    Sig: Use as directed 15 mLs in the mouth or throat as needed for mouth pain (Do NOT exceed 8 doses in a 24 hour period).    Dispense:  100 mL    Refill:  0    Order  Specific Question:   Supervising Provider    Answer:   Raylene Everts [[id number]]   Get plenty of rest and push fluids Prednisone prescribed.  Take as directed and to completion Viscous lidocaine prescribed.  This is an oral solution you can swish, and gargle as needed for symptomatic relief of sore throat.  Do not exceed 8 doses in a 24 hour period.  Do not use prior to eating, as this will numb your entire mouth.   Drink warm or cool liquids, use throat lozenges, or popsicles to help alleviate symptoms Take OTC ibuprofen or tylenol as needed for pain Follow up with PCP if symptoms persists Follow up in person or go to ER if patient has any new or worsening symptoms such as fever, chills, nausea, vomiting, worsening sore throat, cough, abdominal pain, chest pain, changes in bowel or bladder habits, etc...  I discussed the assessment and treatment plan with the patient. The patient was provided an opportunity to ask questions and all were answered. The patient agreed with the plan and demonstrated an understanding of the instructions.   The patient was advised to call back or seek an in-person evaluation if the symptoms worsen or if the condition fails to improve as anticipated.  I provided 4 minutes of non-face-to-face time during this encounter.  [place], PA-C  03/01/2020 11:01 AM         Lestine Box, PA-C 03/01/20 1102

## 2020-03-04 ENCOUNTER — Other Ambulatory Visit: Payer: Self-pay | Admitting: Family Medicine

## 2020-03-04 ENCOUNTER — Telehealth: Admitting: Physician Assistant

## 2020-03-04 DIAGNOSIS — J019 Acute sinusitis, unspecified: Secondary | ICD-10-CM | POA: Diagnosis not present

## 2020-03-04 DIAGNOSIS — B9789 Other viral agents as the cause of diseases classified elsewhere: Secondary | ICD-10-CM

## 2020-03-04 MED ORDER — AZITHROMYCIN 250 MG PO TABS: ORAL_TABLET | ORAL | 0 refills | Status: DC

## 2020-03-04 MED ORDER — FLUTICASONE PROPIONATE 50 MCG/ACT NA SUSP: 2.0000 | Freq: Every day | NASAL | 6 refills | Status: DC

## 2020-03-04 NOTE — Progress Notes (Addendum)
Hi Tracey Malone,   We are sorry that you are not feeling well.  Based on what you have shared with me it looks like you have sinusitis. Please finish the entire 5-day course of Prednisone you were prescribed by the Urgent Care. I will prescribe an antibiotic to start post-dated for once your prednisone course is complete, in the case your symptoms worsen.   Based on your presentation I believe you most likely have Acute Viral Sinusitis.  This is an infection most likely caused by a virus. There is not specific treatment for viral sinusitis other than to help you with the symptoms until the infection runs its course.  Symptoms of viral sinusitis typically begin to resolve between 7-10 days.      Saline nasal spray help and can safely be used as often as needed for congestion, I have prescribed: Fluticasone nasal spray two sprays in each nostril once a day  You may also use an oral decongestant such as Mucinex D or if you have glaucoma or high blood pressure use plain Mucinex.  Some authorities believe that zinc sprays or the use of Echinacea may shorten the course of your symptoms.  Sinus infections are not as easily transmitted as other respiratory infection, however we still recommend that you avoid close contact with loved ones, especially the very young and elderly.  Remember to wash your hands thoroughly throughout the day as this is the number one way to prevent the spread of infection!  Home Care:  Only take medications as instructed by your medical team.  Do not take these medications with alcohol.  A steam or ultrasonic humidifier can help congestion.  You can place a towel over your head and breathe in the steam from hot water coming from a faucet.  Avoid close contacts especially the very young and the elderly.  Cover your mouth when you cough or sneeze.  Always remember to wash your hands.  Get Help Right Away If:  You develop worsening fever or sinus pain.  You develop a severe  head ache or visual changes.  Your symptoms persist after you have completed your treatment plan.  Make sure you  Understand these instructions.  Will watch your condition.  Will get help right away if you are not doing well or get worse.  Your e-visit answers were reviewed by a board certified advanced clinical practitioner to complete your personal care plan.  Depending on the condition, your plan could have included both over the counter or prescription medications.  If there is a problem please reply  once you have received a response from your provider.  Your safety is important to [place].  If you have drug allergies check your prescription carefully.    You can use MyChart to ask questions about today's visit, request a non-urgent call back, or ask for a work or school excuse for 24 hours related to this e-Visit. If it has been greater than 24 hours you will need to follow up with your provider, or enter a new e-Visit to address those concerns.  You will get an e-mail in the next two days asking about your experience.  I hope that your e-visit has been valuable and will speed your recovery. Thank you for using e-visits.   Greater than 5 minutes, yet less than 10 minutes of time have been spent researching, coordinating and implementing care for this patient today.

## 2020-03-04 NOTE — Addendum Note (Signed)
Addended by: Sebastian Ache on: 03/04/2020 01:09 PM   Modules accepted: Orders

## 2020-03-05 NOTE — Telephone Encounter (Signed)
Ok to refill??  Last office visit 12/14/2019.  Last refill 01/25/2020.

## 2020-03-06 MED ORDER — METHYLPHENIDATE HCL ER (LA) 10 MG PO CP24: 10.0000 mg | ORAL_CAPSULE | Freq: Every day | ORAL | 0 refills | Status: DC

## 2020-03-15 ENCOUNTER — Telehealth: Admitting: Physician Assistant

## 2020-03-15 DIAGNOSIS — R399 Unspecified symptoms and signs involving the genitourinary system: Secondary | ICD-10-CM

## 2020-03-15 MED ORDER — NITROFURANTOIN MONOHYD MACRO 100 MG PO CAPS: 100.0000 mg | ORAL_CAPSULE | Freq: Two times a day (BID) | ORAL | 0 refills | Status: DC

## 2020-03-15 MED ORDER — NITROFURANTOIN MONOHYD MACRO 100 MG PO CAPS: 100.0000 mg | ORAL_CAPSULE | Freq: Two times a day (BID) | ORAL | 0 refills | Status: AC

## 2020-03-15 NOTE — Progress Notes (Signed)

## 2020-03-15 NOTE — Addendum Note (Signed)
Addended by: Sebastian Ache on: 03/15/2020 06:08 PM   Modules accepted: Orders

## 2020-03-25 ENCOUNTER — Other Ambulatory Visit: Payer: Self-pay | Admitting: Family Medicine

## 2020-03-25 NOTE — Telephone Encounter (Signed)
Ok to refill 

## 2020-03-31 ENCOUNTER — Encounter: Payer: Self-pay | Admitting: Family Medicine

## 2020-03-31 ENCOUNTER — Telehealth: Payer: Self-pay

## 2020-03-31 ENCOUNTER — Other Ambulatory Visit: Payer: Self-pay

## 2020-03-31 ENCOUNTER — Ambulatory Visit (INDEPENDENT_AMBULATORY_CARE_PROVIDER_SITE_OTHER): Admitting: Family Medicine

## 2020-03-31 VITALS — BP 102/54 | HR 82 | Temp 97.9°F | Resp 14 | Ht 65.0 in | Wt 161.0 lb

## 2020-03-31 DIAGNOSIS — R3129 Other microscopic hematuria: Secondary | ICD-10-CM

## 2020-03-31 DIAGNOSIS — N3001 Acute cystitis with hematuria: Secondary | ICD-10-CM

## 2020-03-31 LAB — URINALYSIS, ROUTINE W REFLEX MICROSCOPIC
Bacteria, UA: NONE SEEN /HPF
Bilirubin Urine: NEGATIVE
Glucose, UA: NEGATIVE
Hyaline Cast: NONE SEEN /LPF
Ketones, ur: NEGATIVE
Leukocytes,Ua: NEGATIVE
Nitrite: NEGATIVE
Protein, ur: NEGATIVE
Specific Gravity, Urine: 1.015 (ref 1.001–1.03)
WBC, UA: NONE SEEN /HPF (ref 0–5)
pH: 7 (ref 5.0–8.0)

## 2020-03-31 LAB — MICROSCOPIC MESSAGE

## 2020-03-31 MED ORDER — FLUCONAZOLE 150 MG PO TABS
150.0000 mg | ORAL_TABLET | Freq: Once | ORAL | 0 refills | Status: AC
Start: 2020-03-31 — End: 2020-03-31

## 2020-03-31 MED ORDER — CIPROFLOXACIN HCL 500 MG PO TABS
500.0000 mg | ORAL_TABLET | Freq: Two times a day (BID) | ORAL | 0 refills | Status: DC
Start: 2020-03-31 — End: 2020-05-23

## 2020-03-31 NOTE — Patient Instructions (Signed)
Start cipro We will call with culture results  F/U pending results

## 2020-03-31 NOTE — Progress Notes (Signed)
   Subjective:    Patient ID: Tracey Malone, female    DOB: [birth year], 44 y.o.   MRN: [id number]  Patient presents for Dysuria (having frequent UTI's and concerned that bladder mesh may be malfunctioning)   Pt here with recurrent UTI   She had bladder surgery with mesh after her last child due to in incontince 20 years ago, states she has had urinary issues since then  She has done multiple E visits due to dysuria over the past year  Over the past year she has been treated at least 4 times   Last on Macrobid 100mg  BID on June 12th given by e visit  No change in bowels, no vaginal discharge Menses regular  Review Of Systems:  GEN- denies fatigue, fever, weight loss,weakness, recent illness HEENT- denies eye drainage, change in vision, nasal discharge, CVS- denies chest pain, palpitations RESP- denies SOB, cough, wheeze ABD- denies N/V, change in stools, abd pain GU- denies dysuria, hematuria, dribbling, incontinence MSK- denies joint pain, muscle aches, injury Neuro- denies headache, dizziness, syncope, seizure activity       Objective:    BP (!) 102/54   Pulse 82   Temp 97.9 F (36.6 C) (Temporal)   Resp 14   Ht 5\' 5"  (1.651 m)   Wt 161 lb (73 kg)   SpO2 98%   BMI 26.79 kg/m  GEN- NAD, alert and oriented x3 HEENT- PERRL, EOMI, non injected sclera, pink conjunctiva, MMM, oropharynx clear CVS- RRR, no murmur RESP-CTAB ABD-NABS,soft,NT,ND, no CVA tenderness GU- normal external genitalia, vaginal mucosa pink and moist, cervix visualized no growth, no blood form os, minimal thin clear discharge, no CMT, no ovarian masses, uterus normal size, bladder sitting low, no lesions noted  EXT- No edema Pulses- Radial 2+        Assessment & Plan:      Problem List Items Addressed This Visit      Unprioritized   UTI (urinary tract infection) - Primary    Start Cipro, since her visits that I can see are all E vistis she does not have UA/CUlture in the system, possible  resistant bacteria, or chronic cystitis.  If culture is negative, I would recommend urology evaluation of her bladder and ongoing symtoms      Relevant Orders   Urinalysis, Routine w reflex microscopic (Completed)   Urine Culture    Other Visit Diagnoses    Microscopic hematuria          Note: This dictation was prepared with Dragon dictation along with smaller phrase technology. Any transcriptional errors that result from this process are unintentional.

## 2020-03-31 NOTE — Telephone Encounter (Signed)
Pt needs OV 

## 2020-03-31 NOTE — Telephone Encounter (Signed)
Pt has a Bladder Mesh in place, she's concerned of a UTI, pain when urinating, cloudy urine, no odor, no discharge, no itching. She has been taking Microbids, but she's seeing no improvement.

## 2020-04-01 ENCOUNTER — Encounter: Payer: Self-pay | Admitting: Family Medicine

## 2020-04-01 LAB — URINE CULTURE
MICRO NUMBER:: 10641989
SPECIMEN QUALITY:: ADEQUATE

## 2020-04-01 NOTE — Assessment & Plan Note (Signed)
Start Cipro, since her visits that I can see are all E vistis she does not have UA/CUlture in the system, possible resistant bacteria, or chronic cystitis.  If culture is negative, I would recommend urology evaluation of her bladder and ongoing symtoms

## 2020-04-01 NOTE — Telephone Encounter (Signed)
Pt came in for OV with K. Jeanice Lim, MD

## 2020-04-07 ENCOUNTER — Other Ambulatory Visit: Payer: Self-pay | Admitting: Family Medicine

## 2020-04-09 ENCOUNTER — Other Ambulatory Visit: Payer: Self-pay

## 2020-04-09 NOTE — Telephone Encounter (Signed)
Ok to refill 

## 2020-04-09 NOTE — Telephone Encounter (Signed)
Last refill 03/25/20 

## 2020-04-10 ENCOUNTER — Other Ambulatory Visit: Payer: Self-pay | Admitting: *Deleted

## 2020-04-10 DIAGNOSIS — N3020 Other chronic cystitis without hematuria: Secondary | ICD-10-CM

## 2020-04-14 ENCOUNTER — Other Ambulatory Visit: Payer: Self-pay | Admitting: Family Medicine

## 2020-04-14 MED ORDER — METHYLPHENIDATE HCL ER (LA) 10 MG PO CP24: 10.0000 mg | ORAL_CAPSULE | Freq: Every day | ORAL | 0 refills | Status: DC

## 2020-04-14 NOTE — Telephone Encounter (Signed)
Ok to refill??  Last office visit 12/14/2019.  Last refill 03/06/2020.

## 2020-04-22 ENCOUNTER — Other Ambulatory Visit: Payer: Self-pay | Admitting: Family Medicine

## 2020-04-22 NOTE — Telephone Encounter (Signed)
Ok to refill 

## 2020-05-03 ENCOUNTER — Other Ambulatory Visit: Payer: Self-pay | Admitting: Family Medicine

## 2020-05-05 NOTE — Telephone Encounter (Signed)
Ok to refill 

## 2020-05-12 ENCOUNTER — Other Ambulatory Visit: Payer: Self-pay | Admitting: Family Medicine

## 2020-05-12 NOTE — Telephone Encounter (Signed)
Ok to refill 

## 2020-05-23 ENCOUNTER — Other Ambulatory Visit: Payer: Self-pay | Admitting: Family Medicine

## 2020-05-23 ENCOUNTER — Other Ambulatory Visit: Payer: Self-pay

## 2020-05-23 ENCOUNTER — Ambulatory Visit (INDEPENDENT_AMBULATORY_CARE_PROVIDER_SITE_OTHER): Admitting: Family Medicine

## 2020-05-23 ENCOUNTER — Encounter: Payer: Self-pay | Admitting: Family Medicine

## 2020-05-23 VITALS — BP 110/64 | HR 72 | Temp 98.9°F | Resp 14 | Ht 65.0 in | Wt 162.0 lb

## 2020-05-23 DIAGNOSIS — M7712 Lateral epicondylitis, left elbow: Secondary | ICD-10-CM

## 2020-05-23 NOTE — Progress Notes (Signed)
Subjective:    Patient ID: Tracey Malone, female    DOB: 28-Jun-1976, 44 y.o.   MRN: [id number]  HPI Patient presents today complaining of left elbow pain.  The pain is located over the lateral epicondyle.  She is tender to touch over the lateral epicondyle.  It hurts for her to grip objects.  It hurts for her to dorsiflex her wrist.  She denies any specific injury.  Is been gradually hurting and getting steadily worse despite her resting it.  There is no erythema or warmth or swelling in that area.  She has normal grip strength.  She has normal strength with wrist dorsiflexion against resistance. Past Medical History:  Diagnosis Date  . ADD (attention deficit disorder)   . Bell's palsy   . Migraines    Past Surgical History:  Procedure Laterality Date  . HEMORRHOID SURGERY    . INCONTINENCE SURGERY    . TUBAL LIGATION     macular degeneration Allergies  Allergen Reactions  . Codeine Shortness Of Breath  . Topamax [Topiramate]     Cognitive Slowing--types same word over and over. Cannot get out correct words when talking.  Colleen Can [Sumatriptan-Naproxen Sodium] Shortness Of Breath and Swelling    Throat gets tight.  . Penicillins Swelling    Swelling unknown  Has patient had a PCN reaction causing immediate rash, facial/tongue/throat swelling, SOB or lightheadedness with hypotension: Unknown Has patient had a PCN reaction causing severe rash involving mucus membranes or skin necrosis: No  Has patient had a PCN reaction that required hospitalization: No  Has patient had a PCN reaction occurring within the last 10 years: No  If all of the above answers are "NO", then may proceed with Cep   Social History   Socioeconomic History  . Marital status: Married    Spouse name: Not on file  . Number of children: Not on file  . Years of education: Not on file  . Highest education level: Not on file  Occupational History  . Not on file  Tobacco Use  . Smoking status: Never  Smoker  . Smokeless tobacco: Never Used  Substance and Sexual Activity  . Alcohol use: No  . Drug use: No  . Sexual activity: Yes    Birth control/protection: Surgical    Comment: Married to [employer], two kids.  Retired from [employer].  Other Topics Concern  . Not on file  Social History Narrative  . Not on file   Social Determinants of Health   Financial Resource Strain:   . Difficulty of Paying Living Expenses: Not on file  Food Insecurity:   . Worried About Programme researcher, broadcasting/film/video in the Last Year: Not on file  . Ran Out of Food in the Last Year: Not on file  Transportation Needs:   . Lack of Transportation (Medical): Not on file  . Lack of Transportation (Non-Medical): Not on file  Physical Activity:   . Days of Exercise per Week: Not on file  . Minutes of Exercise per Session: Not on file  Stress:   . Feeling of Stress : Not on file  Social Connections:   . Frequency of Communication with Friends and Family: Not on file  . Frequency of Social Gatherings with Friends and Family: Not on file  . Attends Religious Services: Not on file  . Active Member of Clubs or Organizations: Not on file  . Attends Banker Meetings: Not on file  . Marital Status: Not on  file  Intimate Partner Violence:   . Fear of Current or Ex-Partner: Not on file  . Emotionally Abused: Not on file  . Physically Abused: Not on file  . Sexually Abused: Not on file      Review of Systems  All other systems reviewed and are negative.      Objective:   Physical Exam Vitals reviewed.  Constitutional:      Appearance: Normal appearance.  Cardiovascular:     Rate and Rhythm: Normal rate and regular rhythm.     Heart sounds: Normal heart sounds.  Pulmonary:     Breath sounds: Normal breath sounds.  Musculoskeletal:     Left elbow: No swelling, deformity, effusion or lacerations. Normal range of motion. Tenderness present in lateral epicondyle.  Neurological:     Mental Status: She is  alert.           Assessment & Plan:  Lateral epicondylitis of left elbow  Patient appears to have lateral epicondylitis of the left elbow.  We discussed treatment strategies and she elects to receive a cortisone injection.  Using sterile technique, I injected a mixture of 1 cc of lidocaine and 1 cc of 40 mg/mL Kenalog just distal to the lateral epicondyle near the point of maximum tenderness adjacent to the tendon.  Patient tolerated the procedure well without complication.  Recheck in 1 month if no better or sooner if worse

## 2020-05-23 NOTE — Telephone Encounter (Signed)
Ok to refill 

## 2020-06-05 ENCOUNTER — Other Ambulatory Visit: Payer: Self-pay | Admitting: Family Medicine

## 2020-06-05 NOTE — Telephone Encounter (Signed)
Ok to refill 

## 2020-06-13 ENCOUNTER — Other Ambulatory Visit: Payer: Self-pay

## 2020-06-13 ENCOUNTER — Telehealth: Payer: Self-pay | Admitting: *Deleted

## 2020-06-13 ENCOUNTER — Ambulatory Visit (INDEPENDENT_AMBULATORY_CARE_PROVIDER_SITE_OTHER): Admitting: Family Medicine

## 2020-06-13 VITALS — BP 110/70 | HR 80 | Temp 97.8°F | Ht 65.0 in | Wt 155.0 lb

## 2020-06-13 DIAGNOSIS — E7800 Pure hypercholesterolemia, unspecified: Secondary | ICD-10-CM | POA: Diagnosis not present

## 2020-06-13 DIAGNOSIS — Z0000 Encounter for general adult medical examination without abnormal findings: Secondary | ICD-10-CM

## 2020-06-13 DIAGNOSIS — Z124 Encounter for screening for malignant neoplasm of cervix: Secondary | ICD-10-CM

## 2020-06-13 DIAGNOSIS — Z0001 Encounter for general adult medical examination with abnormal findings: Secondary | ICD-10-CM | POA: Diagnosis not present

## 2020-06-13 NOTE — Telephone Encounter (Signed)
-----   Message from Donita Brooks, MD sent at 06/13/2020  3:25 PM EDT ----- Please schedule cologuard

## 2020-06-13 NOTE — Telephone Encounter (Signed)
Received verbal orders for Cologuard.   Order placed via [employer].   Cologuard (Order [id number])

## 2020-06-13 NOTE — Progress Notes (Signed)
Subjective:    Patient ID: Tracey Malone, female    DOB: 07-22-1976, 44 y.o.   MRN: [id number]  HPI Patient is here today for complete physical exam.  She is due for a Pap smear.  She is due for colon cancer screening.  She had her mammogram in April and it was normal.  We discussed a colonoscopy versus Cologuard.  After discussing her options, the patient would like to try Cologuard.  She has not had a flu shot.  She has not had her Covid vaccination.  Otherwise, she is doing well with no concerns  Past Medical History:  Diagnosis Date  . ADD (attention deficit disorder)   . Bell's palsy   . Migraines    Past Surgical History:  Procedure Laterality Date  . HEMORRHOID SURGERY    . INCONTINENCE SURGERY    . TUBAL LIGATION     Current Outpatient Medications on File Prior to Visit  Medication Sig Dispense Refill  . cyclobenzaprine (FLEXERIL) 10 MG tablet TAKE 1 TABLET(10 MG) BY MOUTH THREE TIMES DAILY AS NEEDED FOR MUSCLE SPASMS 30 tablet 0  . fluticasone (FLONASE) 50 MCG/ACT nasal spray Place 2 sprays into both nostrils daily. 16 g 6  . methylphenidate (RITALIN LA) 10 MG 24 hr capsule Take 1 capsule (10 mg total) by mouth daily. 30 capsule 0   No current facility-administered medications on file prior to visit.   Allergies  Allergen Reactions  . Codeine Shortness Of Breath  . Topamax [Topiramate]     Cognitive Slowing--types same word over and over. Cannot get out correct words when talking.  Colleen Can [Sumatriptan-Naproxen Sodium] Shortness Of Breath and Swelling    Throat gets tight.  . Penicillins Swelling    Swelling unknown  Has patient had a PCN reaction causing immediate rash, facial/tongue/throat swelling, SOB or lightheadedness with hypotension: Unknown Has patient had a PCN reaction causing severe rash involving mucus membranes or skin necrosis: No  Has patient had a PCN reaction that required hospitalization: No  Has patient had a PCN reaction occurring within  the last 10 years: No  If all of the above answers are "NO", then may proceed with Cep   Social History   Socioeconomic History  . Marital status: Married    Spouse name: Not on file  . Number of children: Not on file  . Years of education: Not on file  . Highest education level: Not on file  Occupational History  . Not on file  Tobacco Use  . Smoking status: Never Smoker  . Smokeless tobacco: Never Used  Substance and Sexual Activity  . Alcohol use: No  . Drug use: No  . Sexual activity: Yes    Birth control/protection: Surgical    Comment: Married to [employer], two kids.  Retired from [employer].  Other Topics Concern  . Not on file  Social History Narrative  . Not on file   Social Determinants of Health   Financial Resource Strain:   . Difficulty of Paying Living Expenses: Not on file  Food Insecurity:   . Worried About Programme researcher, broadcasting/film/video in the Last Year: Not on file  . Ran Out of Food in the Last Year: Not on file  Transportation Needs:   . Lack of Transportation (Medical): Not on file  . Lack of Transportation (Non-Medical): Not on file  Physical Activity:   . Days of Exercise per Week: Not on file  . Minutes of Exercise per Session: Not on  file  Stress:   . Feeling of Stress : Not on file  Social Connections:   . Frequency of Communication with Friends and Family: Not on file  . Frequency of Social Gatherings with Friends and Family: Not on file  . Attends Religious Services: Not on file  . Active Member of Clubs or Organizations: Not on file  . Attends Banker Meetings: Not on file  . Marital Status: Not on file  Intimate Partner Violence:   . Fear of Current or Ex-Partner: Not on file  . Emotionally Abused: Not on file  . Physically Abused: Not on file  . Sexually Abused: Not on file   Family History  Problem Relation Age of Onset  . COPD Father       Review of Systems  All other systems reviewed and are negative.      Objective:    Physical Exam Vitals reviewed. Exam conducted with a chaperone present.  Constitutional:      General: She is not in acute distress.    Appearance: She is well-developed. She is not diaphoretic.  HENT:     Head: Normocephalic and atraumatic.     Right Ear: External ear normal.     Left Ear: External ear normal.     Nose: Nose normal.     Mouth/Throat:     Pharynx: No oropharyngeal exudate.  Eyes:     General: No scleral icterus.       Right eye: No discharge.        Left eye: No discharge.     Conjunctiva/sclera: Conjunctivae normal.     Pupils: Pupils are equal, round, and reactive to light.  Neck:     Thyroid: No thyromegaly.     Vascular: No JVD.     Trachea: No tracheal deviation.  Cardiovascular:     Rate and Rhythm: Normal rate and regular rhythm.     Heart sounds: Normal heart sounds. No murmur heard.  No friction rub. No gallop.   Pulmonary:     Effort: Pulmonary effort is normal. No respiratory distress.     Breath sounds: Normal breath sounds. No wheezing or rales.  Chest:     Chest wall: No tenderness.  Abdominal:     General: Bowel sounds are normal. There is no distension.     Palpations: Abdomen is soft. There is no mass.     Tenderness: There is no abdominal tenderness. There is no guarding or rebound.  Genitourinary:    General: Normal vulva.     Exam position: Lithotomy position.     Vagina: No vaginal discharge.     Cervix: No cervical motion tenderness, friability or erythema.     Uterus: Normal.      Adnexa: Right adnexa normal and left adnexa normal.       Right: No tenderness or fullness.         Left: No tenderness or fullness.    Musculoskeletal:        General: No tenderness. Normal range of motion.     Cervical back: Normal range of motion and neck supple.  Lymphadenopathy:     Cervical: No cervical adenopathy.  Skin:    General: Skin is warm.     Coloration: Skin is not pale.     Findings: No erythema or rash.  Neurological:     Mental  Status: She is alert and oriented to person, place, and time.     Cranial Nerves: No cranial nerve  deficit.     Motor: No abnormal muscle tone.     Coordination: Coordination normal.     Deep Tendon Reflexes: Reflexes are normal and symmetric.  Psychiatric:        Behavior: Behavior normal.        Thought Content: Thought content normal.        Judgment: Judgment normal.           Assessment & Plan:  Cervical cancer screening - Plan: Pap IG w/ reflex to HPV when ASC-U  General medical exam  Physical exam today is normal.  Of asked the patient return fasting for a fasting lipid panel.  I reviewed her labs from the spring and her cholesterol was slightly elevated.  However since that time she has lost considerable weight and she would like to see how her cholesterol is doing after the weight loss.  I recommended a flu shot.  I strongly recommended Covid vaccination.  I will schedule the patient for Cologuard.  Mammogram is up-to-date.  Pap smear was sent to pathology in a labeled container.

## 2020-06-17 LAB — PAP IG W/ RFLX HPV ASCU

## 2020-06-18 ENCOUNTER — Other Ambulatory Visit: Payer: Self-pay | Admitting: Family Medicine

## 2020-06-18 NOTE — Telephone Encounter (Signed)
Ok to refill 

## 2020-06-19 ENCOUNTER — Encounter: Payer: Self-pay | Admitting: Family Medicine

## 2020-07-01 ENCOUNTER — Other Ambulatory Visit: Payer: Self-pay | Admitting: Family Medicine

## 2020-07-14 ENCOUNTER — Other Ambulatory Visit: Payer: Self-pay | Admitting: Family Medicine

## 2020-07-14 MED ORDER — METHYLPHENIDATE HCL ER (LA) 10 MG PO CP24: 10.0000 mg | ORAL_CAPSULE | Freq: Every day | ORAL | 0 refills | Status: DC

## 2020-07-14 NOTE — Telephone Encounter (Signed)
Ok to refill??  Last office visit 06/13/2020.  Last refill 04/14/2020.

## 2020-10-04 HISTORY — PX: GANGLION CYST EXCISION: SHX1691

## 2020-10-04 HISTORY — PX: OTHER SURGICAL HISTORY: SHX169

## 2020-11-02 ENCOUNTER — Other Ambulatory Visit: Payer: Self-pay | Admitting: Family Medicine

## 2020-11-03 MED ORDER — METHYLPHENIDATE HCL ER (LA) 10 MG PO CP24: 10.0000 mg | ORAL_CAPSULE | Freq: Every day | ORAL | 0 refills | Status: DC

## 2020-11-03 NOTE — Telephone Encounter (Signed)
Ok to refill??  Last office visit 06/13/2020.  Last refill 07/14/2020.

## 2021-01-29 ENCOUNTER — Encounter: Payer: Self-pay | Admitting: Family Medicine

## 2021-01-29 ENCOUNTER — Other Ambulatory Visit: Payer: Self-pay | Admitting: Family Medicine

## 2021-01-29 MED ORDER — METHYLPHENIDATE HCL ER (LA) 10 MG PO CP24: 10.0000 mg | ORAL_CAPSULE | Freq: Every day | ORAL | 0 refills | Status: DC

## 2021-01-29 NOTE — Telephone Encounter (Signed)
Ok to refill??  Last office visit 06/13/2020.  Last refill 11/03/2020.

## 2021-02-03 ENCOUNTER — Ambulatory Visit: Admitting: Family Medicine

## 2021-02-05 ENCOUNTER — Ambulatory Visit (INDEPENDENT_AMBULATORY_CARE_PROVIDER_SITE_OTHER): Admitting: Family Medicine

## 2021-02-05 ENCOUNTER — Other Ambulatory Visit: Payer: Self-pay

## 2021-02-05 ENCOUNTER — Encounter: Payer: Self-pay | Admitting: Family Medicine

## 2021-02-05 VITALS — BP 112/62 | HR 82 | Temp 98.1°F | Resp 14 | Ht 65.0 in | Wt 166.0 lb

## 2021-02-05 DIAGNOSIS — M7712 Lateral epicondylitis, left elbow: Secondary | ICD-10-CM | POA: Diagnosis not present

## 2021-02-05 MED ORDER — HYDROCODONE-ACETAMINOPHEN 5-325 MG PO TABS: 1.0000 | ORAL_TABLET | Freq: Four times a day (QID) | ORAL | 0 refills | Status: DC | PRN

## 2021-02-05 MED ORDER — DICLOFENAC SODIUM 75 MG PO TBEC: 75.0000 mg | DELAYED_RELEASE_TABLET | Freq: Two times a day (BID) | ORAL | 0 refills | Status: DC

## 2021-02-05 NOTE — Progress Notes (Signed)
Subjective:    Patient ID: Tracey Malone, female    DOB: 28-Oct-1975, 45 y.o.   MRN: [id number]  HPI  8/21 Patient presents today complaining of left elbow pain.  The pain is located over the lateral epicondyle.  She is tender to touch over the lateral epicondyle.  It hurts for her to grip objects.  It hurts for her to dorsiflex her wrist.  She denies any specific injury.  Is been gradually hurting and getting steadily worse despite her resting it.  There is no erythema or warmth or swelling in that area.  She has normal grip strength.  She has normal strength with wrist dorsiflexion against resistance.At that time, my plan was: Patient appears to have lateral epicondylitis of the left elbow.  We discussed treatment strategies and she elects to receive a cortisone injection.  Using sterile technique, I injected a mixture of 1 cc of lidocaine and 1 cc of 40 mg/mL Kenalog just distal to the lateral epicondyle near the point of maximum tenderness adjacent to the tendon.  Patient tolerated the procedure well without complication.  Recheck in 1 month if no better or sooner if worse  02/05/21 Patient states that the cortisone injection I gave her in August made the pain completely subside.  She has been doing excellent now for the last 9 months.  However, she recently was lifting heavy boxes and heavy rocks and she irritated her left elbow.  She again is having pain and tenderness over the lateral epicondyles.  She is also having some pain and tenderness over the insertion of the biceps tendon near the glenohumeral joint.  I am able to reproduce the majority of her pain by resisting wrist dorsiflexion.  She has not tried any NSAIDs.  She is wearing an elbow strap. Past Medical History:  Diagnosis Date  . ADD (attention deficit disorder)   . Bell's palsy   . Migraines    Past Surgical History:  Procedure Laterality Date  . HEMORRHOID SURGERY    . INCONTINENCE SURGERY    . TUBAL LIGATION     macular  degeneration Allergies  Allergen Reactions  . Codeine Shortness Of Breath  . Topamax [Topiramate]     Cognitive Slowing--types same word over and over. Cannot get out correct words when talking.  Colleen Can [Sumatriptan-Naproxen Sodium] Shortness Of Breath and Swelling    Throat gets tight.  . Penicillins Swelling    Swelling unknown  Has patient had a PCN reaction causing immediate rash, facial/tongue/throat swelling, SOB or lightheadedness with hypotension: Unknown Has patient had a PCN reaction causing severe rash involving mucus membranes or skin necrosis: No  Has patient had a PCN reaction that required hospitalization: No  Has patient had a PCN reaction occurring within the last 10 years: No  If all of the above answers are "NO", then may proceed with Cep   Social History   Socioeconomic History  . Marital status: Married    Spouse name: Not on file  . Number of children: Not on file  . Years of education: Not on file  . Highest education level: Not on file  Occupational History  . Not on file  Tobacco Use  . Smoking status: Never Smoker  . Smokeless tobacco: Never Used  Substance and Sexual Activity  . Alcohol use: No  . Drug use: No  . Sexual activity: Yes    Birth control/protection: Surgical    Comment: Married to [employer], two kids.  Retired from [employer].  Other Topics Concern  . Not on file  Social History Narrative  . Not on file   Social Determinants of Health   Financial Resource Strain: Not on file  Food Insecurity: Not on file  Transportation Needs: Not on file  Physical Activity: Not on file  Stress: Not on file  Social Connections: Not on file  Intimate Partner Violence: Not on file      Review of Systems  All other systems reviewed and are negative.      Objective:   Physical Exam Vitals reviewed.  Constitutional:      Appearance: Normal appearance.  Cardiovascular:     Rate and Rhythm: Normal rate and regular rhythm.     Heart sounds:  Normal heart sounds.  Pulmonary:     Breath sounds: Normal breath sounds.  Musculoskeletal:     Left elbow: No swelling, deformity, effusion or lacerations. Normal range of motion. Tenderness present in lateral epicondyle.  Neurological:     Mental Status: She is alert.           Assessment & Plan:  Lateral epicondylitis of left elbow  She has good strength with wrist dorsiflexion and good grip strength.  I do not believe that she has torn the tenderness insertion however I do believe that she is reaggravated an old injury.  I recommended an elbow strap with diclofenac 75 mg twice daily and then reassess in 1 week.  If worsening, consider repeat cortisone injection.  Also recommended rest and ice

## 2021-02-11 ENCOUNTER — Encounter: Payer: Self-pay | Admitting: Family Medicine

## 2021-02-11 DIAGNOSIS — M7712 Lateral epicondylitis, left elbow: Secondary | ICD-10-CM

## 2021-02-12 ENCOUNTER — Ambulatory Visit: Payer: Self-pay

## 2021-02-12 ENCOUNTER — Ambulatory Visit (INDEPENDENT_AMBULATORY_CARE_PROVIDER_SITE_OTHER): Admitting: Surgery

## 2021-02-12 DIAGNOSIS — G5632 Lesion of radial nerve, left upper limb: Secondary | ICD-10-CM

## 2021-02-12 DIAGNOSIS — M25522 Pain in left elbow: Secondary | ICD-10-CM | POA: Diagnosis not present

## 2021-02-12 DIAGNOSIS — M7712 Lateral epicondylitis, left elbow: Secondary | ICD-10-CM

## 2021-02-12 NOTE — Progress Notes (Signed)
Office Visit Note   Patient: Tracey Malone           Date of Birth: 1976/01/29           MRN: [id number] Visit Date: 02/12/2021              Requested by: Donita Brooks, MD [facility] [address] [place],  [place] [address] PCP: Donita Brooks, MD   Assessment & Plan: Visit Diagnoses:  1. Pain in left elbow   2. Lateral epicondylitis of left elbow   3. Radial tunnel syndrome, left   4. Left elbow pain     Plan:  With patient's ongoing chronic left lateral elbow pain that is failed conservative treatment recommend getting a left elbow MRI to rule out common extensor tendon tear.  Patient will follow up with me in 3 weeks for recheck to discuss results and further treatment options.  I believe that she also has radial tunnel syndrome.  We will see how this does over the next few weeks.  May need to get NCV/EMG study.  I did advise her not to use the tennis elbow strap since this has been increasing her forearm pain.  I did show her some stretching exercises that she can do to see if this will help some.  It is okay for her to use the diclofenac that was given by her PCP.    Instructions: Return in about 3 weeks (around 03/05/2021) for with Lempke review left elbow MRI scan.   Orders:  Orders Placed This Encounter  Procedures  . XR Elbow 2 Views Left  . MR Elbow Left w/o contrast   No orders of the defined types were placed in this encounter.     Procedures: No procedures performed   Clinical Data: No additional findings.   Subjective: Chief Complaint  Patient presents with  . Left Elbow - Pain    HPI 45 year old white female who is new patient to the office comes in with complaints of left lateral elbow pain.  Patient has been referred by her primary care physician Dr. Lynnea Ferrier.  Reviewed patient's notes in the system and she was seen by PCP May 23, 2020 for left lateral elbow pain diagnosed with lateral condyle lightest.  He performed tennis elbow  injection.  States that she did get some relief up until Easter.  She has been having worsening lateral elbow pain with gripping and lifting objects.  PCP is also prescribed diclofenac and hydrocodone without any relief.  She used a tennis elbow strap that made her pain worse.  She denies any specific injury to the elbow.  She does a fair amount of lifting at work which aggravates her pain significantly. Review of Systems No current cardiac pulmonary GI GU issues  Objective: Vital Signs: There were no vitals taken for this visit.  Physical Exam HENT:     Head: Normocephalic and atraumatic.  Eyes:     Extraocular Movements: Extraocular movements intact.  Pulmonary:     Effort: No respiratory distress.  Musculoskeletal:     Comments: Gait is normal.  Cervical spine good range of motion.  She does have mild left brachial plexus and trapezius tenderness.  Bilateral shoulder exam unremarkable.  Negative impingement test.  Left elbow good range of motion.  She does have some swelling around the left elbow.  No bruising.  She has marked tenderness over the lateral condyle and also over the radial tunnel.  Negative Tinel's over  the cubital tunnel.  Right elbow unremarkable.  Bilateral wrist unremarkable.  Negative Tinel's.  Patient has pain in the left lateral elbow with left grip testing and also with wrist extension and supination resistance.  No finger/wrist extensor weakness.  Neurological:     General: No focal deficit present.     Mental Status: She is alert and oriented to person, place, and time.  Psychiatric:        Mood and Affect: Mood normal.     Ortho Exam  Specialty Comments:  No specialty comments available.  Imaging: No results found.   PMFS History: Patient Active Problem List   Diagnosis Date Noted  . Pain in right ankle and joints of right foot 02/14/2017  . 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 Bell's palsy   . UTI (urinary tract  infection) 06/27/2013  . GERD (gastroesophageal reflux disease) 06/27/2013  . ADD (attention deficit disorder) 06/27/2013  . Migraines    Past Medical History:  Diagnosis Date  . ADD (attention deficit disorder)   . Bell's palsy   . Migraines     Family History  Problem Relation Age of Onset  . COPD Father     Past Surgical History:  Procedure Laterality Date  . HEMORRHOID SURGERY    . INCONTINENCE SURGERY    . TUBAL LIGATION     Social History   Occupational History  . Not on file  Tobacco Use  . Smoking status: Never Smoker  . Smokeless tobacco: Never Used  Substance and Sexual Activity  . Alcohol use: No  . Drug use: No  . Sexual activity: Yes    Birth control/protection: Surgical    Comment: Married to [employer], two kids.  Retired from [employer].

## 2021-02-15 ENCOUNTER — Other Ambulatory Visit: Payer: Self-pay

## 2021-02-15 ENCOUNTER — Ambulatory Visit
Admission: RE | Admit: 2021-02-15 | Discharge: 2021-02-15 | Disposition: A | Discharge status: Home / Self Care | Source: Ambulatory Visit | Attending: Surgery | Admitting: Surgery

## 2021-02-15 DIAGNOSIS — M25522 Pain in left elbow: Secondary | ICD-10-CM

## 2021-02-17 ENCOUNTER — Telehealth: Payer: Self-pay | Admitting: Surgery

## 2021-02-17 ENCOUNTER — Telehealth: Payer: Self-pay

## 2021-02-17 NOTE — Telephone Encounter (Signed)
Patient called.

## 2021-02-17 NOTE — Telephone Encounter (Signed)
Called pt and left 1X vm to call and set [facility] review appt after 6/2 with P Barry Dienes. Will try again another time

## 2021-02-18 ENCOUNTER — Ambulatory Visit (INDEPENDENT_AMBULATORY_CARE_PROVIDER_SITE_OTHER): Admitting: Surgery

## 2021-02-18 DIAGNOSIS — M7712 Lateral epicondylitis, left elbow: Secondary | ICD-10-CM

## 2021-02-18 DIAGNOSIS — M25522 Pain in left elbow: Secondary | ICD-10-CM | POA: Diagnosis not present

## 2021-02-18 DIAGNOSIS — S5320XA Traumatic rupture of unspecified radial collateral ligament, initial encounter: Secondary | ICD-10-CM | POA: Diagnosis not present

## 2021-02-18 NOTE — Progress Notes (Signed)
Office Visit Note   Patient: Tracey Malone           Date of Birth: April 20, 1976           MRN: [id number] Visit Date: 02/18/2021              Requested by: Donita Brooks, MD [address] [facility] [address] [place],  [place] [address] PCP: Donita Brooks, MD   Assessment & Plan: Visit Diagnoses:  1. Pain in left elbow   2. Lateral epicondylitis of left elbow   3. Partial tear of radial collateral ligament of elbow     Plan: MRI findings reviewed with patient.  Also discussed results with Dr. Dorene Grebe here in our clinic today.  I advised patient that I think it would be best to refer her to Dr. Dominica Severin hand surgeon with [facility].  He can discuss whether or not surgical invention is needed although I did advise patient that ultimately it would likely come down to needing something done.  She will avoid any aggressive activity with her left upper extremity.  Also wondering if she possibly has radial tunnel syndrome as well.  Let Dr. Amanda Pea decide if NCV/EMG study is indicated.  Follow-Up Instructions: Return in about 5 weeks (around 03/25/2021) for with Persons.   Orders:  Orders Placed This Encounter  Procedures  . Ambulatory referral to Orthopedic Surgery   No orders of the defined types were placed in this encounter.     Procedures: No procedures performed   Clinical Data: No additional findings.   Subjective: Chief Complaint  Patient presents with  . Left Elbow - Follow-up    HPI 45 year old white female with history of chronic left elbow pain returns for review of her MRI scan that was performed December 16, 2020.  Study showed:  CLINICAL DATA:  Worsening chronic left lateral elbow pain.  EXAM: MRI OF THE LEFT ELBOW WITHOUT CONTRAST  TECHNIQUE: Multiplanar, multisequence MR imaging of the elbow was performed. No intravenous contrast was administered.  COMPARISON:  Left elbow x-rays dated Feb 12, 2021.  FINDINGS: TENDONS  Common forearm flexor  origin: Intact.  Common forearm extensor origin: Near complete tear of the common forearm extensor tendon with 8 mm retraction.  Biceps: Intact.  Triceps: Intact.  LIGAMENTS  Medial stabilizers: The ulnar collateral ligament is intact.  Lateral stabilizers: The radial collateral ligament is completely torn from the distal humerus. Lateral ulnar collateral ligament is intact.  Cartilage: Preserved.  No focal chondral defect demonstrated.  Joint: Small joint effusion.  No intra-articular body.  Cubital tunnel: Unremarkable.  The ulnar nerve appears normal.  Bones: No acute or significant extra-articular osseous findings.  Other: None.  IMPRESSION: 1. Near complete tear of the common forearm extensor tendon with 8 mm retraction. 2. Complete tear of the radial collateral ligament from the distal humerus.   Electronically Signed   By: Obie Dredge M.D.   On: 02/15/2021 17:17  Elbow pain unchanged from previous visit.   Objective: Vital Signs: There were no vitals taken for this visit.  Physical Exam  Ortho Exam  Specialty Comments:  No specialty comments available.  Imaging: No results found.   PMFS History: Patient Active Problem List   Diagnosis Date Noted  . Pain in right ankle and joints of right foot 02/14/2017  . 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 Bell's palsy   . UTI (urinary tract infection) 06/27/2013  . GERD (gastroesophageal reflux  disease) 06/27/2013  . ADD (attention deficit disorder) 06/27/2013  . Migraines    Past Medical History:  Diagnosis Date  . ADD (attention deficit disorder)   . Bell's palsy   . Migraines     Family History  Problem Relation Age of Onset  . COPD Father     Past Surgical History:  Procedure Laterality Date  . HEMORRHOID SURGERY    . INCONTINENCE SURGERY    . TUBAL LIGATION     Social History   Occupational History  . Not on file  Tobacco Use  .  Smoking status: Never Smoker  . Smokeless tobacco: Never Used  Substance and Sexual Activity  . Alcohol use: No  . Drug use: No  . Sexual activity: Yes    Birth control/protection: Surgical    Comment: Married to [employer], two kids.  Retired from [employer].

## 2021-02-24 ENCOUNTER — Encounter: Payer: Self-pay | Admitting: Surgery

## 2021-02-25 DIAGNOSIS — M79632 Pain in left forearm: Secondary | ICD-10-CM | POA: Insufficient documentation

## 2021-02-26 DIAGNOSIS — M7712 Lateral epicondylitis, left elbow: Secondary | ICD-10-CM | POA: Insufficient documentation

## 2021-03-10 ENCOUNTER — Other Ambulatory Visit: Payer: Self-pay | Admitting: Family Medicine

## 2021-03-10 NOTE — Telephone Encounter (Signed)
Ok to refill??  Last office visit 02/05/2021   Last refill on Ritalin 01/29/2021.  Last refill on Hydrocodone/APAP 0505/2022.

## 2021-03-11 MED ORDER — METHYLPHENIDATE HCL ER (LA) 10 MG PO CP24: 10.0000 mg | ORAL_CAPSULE | Freq: Every day | ORAL | 0 refills | Status: DC

## 2021-03-11 MED ORDER — HYDROCODONE-ACETAMINOPHEN 5-325 MG PO TABS: 1.0000 | ORAL_TABLET | Freq: Four times a day (QID) | ORAL | 0 refills | Status: DC | PRN

## 2021-03-11 NOTE — Telephone Encounter (Signed)
PDMP reviewed.  Appears patient is on Ritalin chronically.  Norco is being used for elbow pain and short supply given.  Refill given in place of PCP who is out of office.

## 2021-03-13 ENCOUNTER — Other Ambulatory Visit: Payer: Self-pay | Admitting: Family Medicine

## 2021-03-25 ENCOUNTER — Ambulatory Visit: Admitting: Surgery

## 2021-05-10 ENCOUNTER — Other Ambulatory Visit: Payer: Self-pay | Admitting: Nurse Practitioner

## 2021-05-11 MED ORDER — METHYLPHENIDATE HCL ER (LA) 10 MG PO CP24: 10.0000 mg | ORAL_CAPSULE | Freq: Every day | ORAL | 0 refills | Status: DC

## 2021-05-11 MED ORDER — HYDROCODONE-ACETAMINOPHEN 5-325 MG PO TABS: 1.0000 | ORAL_TABLET | Freq: Four times a day (QID) | ORAL | 0 refills | Status: DC | PRN

## 2021-05-11 NOTE — Telephone Encounter (Signed)
Ok to refill??  Last office visit 02/11/2021.  Last refill 03/11/2021 on both.

## 2021-06-12 DIAGNOSIS — R52 Pain, unspecified: Secondary | ICD-10-CM | POA: Insufficient documentation

## 2021-06-29 ENCOUNTER — Other Ambulatory Visit: Payer: Self-pay | Admitting: Family Medicine

## 2021-06-29 NOTE — Telephone Encounter (Signed)
Ok to refill??  Last office visit 5/5/202.  Last refill 05/11/2021.

## 2021-06-30 MED ORDER — METHYLPHENIDATE HCL ER (LA) 10 MG PO CP24: 10.0000 mg | ORAL_CAPSULE | Freq: Every day | ORAL | 0 refills | Status: DC

## 2021-07-20 ENCOUNTER — Encounter: Payer: Self-pay | Admitting: Family Medicine

## 2021-07-21 ENCOUNTER — Other Ambulatory Visit: Payer: Self-pay | Admitting: Family Medicine

## 2021-07-21 DIAGNOSIS — L989 Disorder of the skin and subcutaneous tissue, unspecified: Secondary | ICD-10-CM

## 2021-07-23 DIAGNOSIS — M67431 Ganglion, right wrist: Secondary | ICD-10-CM | POA: Insufficient documentation

## 2021-08-08 ENCOUNTER — Other Ambulatory Visit: Payer: Self-pay | Admitting: Family Medicine

## 2021-08-10 ENCOUNTER — Other Ambulatory Visit: Payer: Self-pay

## 2021-08-10 NOTE — Telephone Encounter (Signed)
Please review.  Last OV 02/05/21 for L elbow pain Last refill 07/02/20, #30, 3 refills Per chart pt also has possible recent multiple fills of Robaxin   Thank you!

## 2021-10-12 ENCOUNTER — Emergency Department (HOSPITAL_BASED_OUTPATIENT_CLINIC_OR_DEPARTMENT_OTHER)
Admission: EM | Admit: 2021-10-12 | Discharge: 2021-10-12 | Disposition: A | Discharge status: Home / Self Care | Attending: Emergency Medicine | Admitting: Emergency Medicine

## 2021-10-12 ENCOUNTER — Other Ambulatory Visit: Payer: Self-pay

## 2021-10-12 ENCOUNTER — Encounter (HOSPITAL_BASED_OUTPATIENT_CLINIC_OR_DEPARTMENT_OTHER): Payer: Self-pay | Admitting: *Deleted

## 2021-10-12 DIAGNOSIS — Z20822 Contact with and (suspected) exposure to covid-19: Secondary | ICD-10-CM | POA: Insufficient documentation

## 2021-10-12 DIAGNOSIS — R519 Headache, unspecified: Secondary | ICD-10-CM | POA: Diagnosis not present

## 2021-10-12 DIAGNOSIS — R059 Cough, unspecified: Secondary | ICD-10-CM | POA: Insufficient documentation

## 2021-10-12 DIAGNOSIS — Z5321 Procedure and treatment not carried out due to patient leaving prior to being seen by health care provider: Secondary | ICD-10-CM | POA: Insufficient documentation

## 2021-10-12 DIAGNOSIS — R079 Chest pain, unspecified: Secondary | ICD-10-CM | POA: Insufficient documentation

## 2021-10-12 DIAGNOSIS — R5383 Other fatigue: Secondary | ICD-10-CM | POA: Diagnosis not present

## 2021-10-12 LAB — RESP PANEL BY RT-PCR (FLU A&B, COVID) ARPGX2
Influenza A by PCR: NEGATIVE
Influenza B by PCR: NEGATIVE
SARS Coronavirus 2 by RT PCR: NEGATIVE

## 2021-10-12 MED ORDER — ACETAMINOPHEN 325 MG PO TABS
650.0000 mg | ORAL_TABLET | Freq: Once | ORAL | Status: AC
  Administered 2021-10-12: 650 mg via ORAL
  Filled 2021-10-12: qty 2

## 2021-10-12 NOTE — ED Triage Notes (Signed)
Cough, headache, fatigue and chest pain.

## 2021-10-27 ENCOUNTER — Other Ambulatory Visit: Payer: Self-pay | Admitting: Family Medicine

## 2021-10-27 ENCOUNTER — Other Ambulatory Visit: Payer: Self-pay

## 2021-10-27 DIAGNOSIS — Z1231 Encounter for screening mammogram for malignant neoplasm of breast: Secondary | ICD-10-CM

## 2021-10-27 DIAGNOSIS — D72829 Elevated white blood cell count, unspecified: Secondary | ICD-10-CM | POA: Insufficient documentation

## 2021-10-27 DIAGNOSIS — K760 Fatty (change of) liver, not elsewhere classified: Secondary | ICD-10-CM | POA: Insufficient documentation

## 2021-10-27 DIAGNOSIS — F419 Anxiety disorder, unspecified: Secondary | ICD-10-CM | POA: Insufficient documentation

## 2021-10-27 DIAGNOSIS — N898 Other specified noninflammatory disorders of vagina: Secondary | ICD-10-CM | POA: Insufficient documentation

## 2021-10-27 DIAGNOSIS — Z780 Asymptomatic menopausal state: Secondary | ICD-10-CM | POA: Insufficient documentation

## 2021-10-27 DIAGNOSIS — E038 Other specified hypothyroidism: Secondary | ICD-10-CM | POA: Insufficient documentation

## 2021-10-27 DIAGNOSIS — E785 Hyperlipidemia, unspecified: Secondary | ICD-10-CM | POA: Insufficient documentation

## 2021-10-27 DIAGNOSIS — R6882 Decreased libido: Secondary | ICD-10-CM | POA: Insufficient documentation

## 2021-10-27 DIAGNOSIS — G479 Sleep disorder, unspecified: Secondary | ICD-10-CM | POA: Insufficient documentation

## 2021-10-27 DIAGNOSIS — R232 Flushing: Secondary | ICD-10-CM | POA: Insufficient documentation

## 2021-10-27 MED ORDER — METHYLPHENIDATE HCL ER (LA) 10 MG PO CP24: 10.0000 mg | ORAL_CAPSULE | Freq: Every day | ORAL | 0 refills | Status: DC

## 2021-10-27 NOTE — Telephone Encounter (Signed)
LROV 06/13/20, acute visit 02/05/21 Last refill 06/30/21, #30, 0 refills  Pt is not taking medication consistently and needs ROV appt.  Please review, thanks!

## 2021-11-02 ENCOUNTER — Other Ambulatory Visit: Payer: Self-pay | Admitting: Family Medicine

## 2021-11-02 ENCOUNTER — Other Ambulatory Visit: Payer: Self-pay

## 2021-11-02 ENCOUNTER — Ambulatory Visit
Admission: RE | Admit: 2021-11-02 | Discharge: 2021-11-02 | Disposition: A | Discharge status: Home / Self Care | Source: Ambulatory Visit

## 2021-11-02 DIAGNOSIS — Z1231 Encounter for screening mammogram for malignant neoplasm of breast: Secondary | ICD-10-CM

## 2022-01-14 ENCOUNTER — Other Ambulatory Visit: Payer: Self-pay | Admitting: Family Medicine

## 2022-01-14 NOTE — Telephone Encounter (Signed)
Ritalin ?LOV 02/05/21 ?Last refill 10/27/21, #30, 0 refills  ? ?Patient also asking if you can take over rx for Diethylpropion HCl CR 75 MG TB24, she has been receiving from weight loss clinic. Per patient she has only been taking this medication for 2 weeks.  ? ? ?Please review and advise, thanks! ? ?

## 2022-01-15 ENCOUNTER — Other Ambulatory Visit: Payer: Self-pay | Admitting: Family Medicine

## 2022-01-15 MED ORDER — METHYLPHENIDATE HCL ER (LA) 10 MG PO CP24: 10.0000 mg | ORAL_CAPSULE | Freq: Every day | ORAL | 0 refills | Status: DC

## 2022-01-20 DIAGNOSIS — M722 Plantar fascial fibromatosis: Secondary | ICD-10-CM | POA: Insufficient documentation

## 2022-02-18 ENCOUNTER — Other Ambulatory Visit: Admitting: Family Medicine

## 2022-03-02 ENCOUNTER — Other Ambulatory Visit: Payer: Self-pay | Admitting: Family Medicine

## 2022-03-02 NOTE — Telephone Encounter (Signed)
Please call pt for yearly appt for med refill.

## 2022-03-09 ENCOUNTER — Other Ambulatory Visit: Payer: Self-pay | Admitting: Family Medicine

## 2022-03-14 MED ORDER — METHYLPHENIDATE HCL ER (LA) 10 MG PO CP24
10.0000 mg | ORAL_CAPSULE | Freq: Every day | ORAL | 0 refills | Status: DC
Start: 2022-03-14 — End: 2022-05-21

## 2022-03-15 ENCOUNTER — Ambulatory Visit (INDEPENDENT_AMBULATORY_CARE_PROVIDER_SITE_OTHER): Admitting: Family Medicine

## 2022-03-15 VITALS — BP 120/82 | HR 76 | Temp 98.4°F | Ht 65.0 in | Wt 184.4 lb

## 2022-03-15 DIAGNOSIS — Z124 Encounter for screening for malignant neoplasm of cervix: Secondary | ICD-10-CM

## 2022-03-15 DIAGNOSIS — Z0000 Encounter for general adult medical examination without abnormal findings: Secondary | ICD-10-CM | POA: Diagnosis not present

## 2022-03-15 DIAGNOSIS — Z1211 Encounter for screening for malignant neoplasm of colon: Secondary | ICD-10-CM | POA: Diagnosis not present

## 2022-03-15 DIAGNOSIS — E7800 Pure hypercholesterolemia, unspecified: Secondary | ICD-10-CM | POA: Diagnosis not present

## 2022-03-15 LAB — CBC WITH DIFFERENTIAL/PLATELET
Absolute Monocytes: 474 cells/uL (ref 200–950)
Basophils Absolute: 30 cells/uL (ref 0–200)
Basophils Relative: 0.4 %
Eosinophils Absolute: 81 cells/uL (ref 15–500)
Eosinophils Relative: 1.1 %
HCT: 36.7 % (ref 35.0–45.0)
Hemoglobin: 12.4 g/dL (ref 11.7–15.5)
Lymphs Abs: 2168 cells/uL (ref 850–3900)
MCH: 30.8 pg (ref 27.0–33.0)
MCHC: 33.8 g/dL (ref 32.0–36.0)
MCV: 91.1 fL (ref 80.0–100.0)
MPV: 9.4 fL (ref 7.5–12.5)
Monocytes Relative: 6.4 %
Neutro Abs: 4647 cells/uL (ref 1500–7800)
Neutrophils Relative %: 62.8 %
Platelets: 347 10*3/uL (ref 140–400)
RBC: 4.03 10*6/uL (ref 3.80–5.10)
RDW: 12.9 % (ref 11.0–15.0)
Total Lymphocyte: 29.3 %
WBC: 7.4 10*3/uL (ref 3.8–10.8)

## 2022-03-15 LAB — COMPLETE METABOLIC PANEL WITH GFR
AG Ratio: 1.7 (calc) (ref 1.0–2.5)
ALT: 99 U/L — ABNORMAL HIGH (ref 6–29)
AST: 56 U/L — ABNORMAL HIGH (ref 10–35)
Albumin: 4 g/dL (ref 3.6–5.1)
Alkaline phosphatase (APISO): 74 U/L (ref 31–125)
BUN: 16 mg/dL (ref 7–25)
CO2: 30 mmol/L (ref 20–32)
Calcium: 9.3 mg/dL (ref 8.6–10.2)
Chloride: 105 mmol/L (ref 98–110)
Creat: 0.73 mg/dL (ref 0.50–0.99)
Globulin: 2.4 g/dL (calc) (ref 1.9–3.7)
Glucose, Bld: 90 mg/dL (ref 65–99)
Potassium: 4.7 mmol/L (ref 3.5–5.3)
Sodium: 142 mmol/L (ref 135–146)
Total Bilirubin: 0.4 mg/dL (ref 0.2–1.2)
Total Protein: 6.4 g/dL (ref 6.1–8.1)
eGFR: 102 mL/min/{1.73_m2} (ref >=60)

## 2022-03-15 LAB — LIPID PANEL
Cholesterol: 216 mg/dL — ABNORMAL HIGH (ref <200)
HDL: 61 mg/dL (ref >=50)
LDL Cholesterol (Calc): 129 mg/dL (calc) — ABNORMAL HIGH
Non-HDL Cholesterol (Calc): 155 mg/dL (calc) — ABNORMAL HIGH (ref <130)
Total CHOL/HDL Ratio: 3.5 (calc) (ref <5.0)
Triglycerides: 151 mg/dL — ABNORMAL HIGH (ref <150)

## 2022-03-15 MED ORDER — PANTOPRAZOLE SODIUM 40 MG PO TBEC: 40.0000 mg | DELAYED_RELEASE_TABLET | Freq: Every day | ORAL | 3 refills | Status: DC

## 2022-03-15 NOTE — Progress Notes (Signed)
Subjective:    Patient ID: Tracey Malone, female    DOB: 09/01/1976, 46 y.o.   MRN: [id number]  HPI Patient is here today for complete physical exam.  Her mammogram was normal 1/23.  Last pap was 2021 and was normal.  Patient is due for colon cancer screening.  She never did the Cologuard for.  Her weight is up to 184 pounds.  She has been struggling with weight loss.  She tried diethylpropion.  She is also tried phentermine.  However her BMI is now over 30.  On her most recent lab work, she was found to have hyperlipidemia with an LDL cholesterol above 140.  She was also found to have elevated liver function test suspicious for fatty liver disease. Past Medical History:  Diagnosis Date   ADD (attention deficit disorder)    Bell's palsy    Migraines    Past Surgical History:  Procedure Laterality Date   HEMORRHOID SURGERY     INCONTINENCE SURGERY     TUBAL LIGATION     Current Outpatient Medications on File Prior to Visit  Medication Sig Dispense Refill   cyanocobalamin (,VITAMIN B-12,) 1000 MCG/ML injection      cyclobenzaprine (FLEXERIL) 10 MG tablet TAKE 1 TABLET(10 MG) BY MOUTH THREE TIMES DAILY AS NEEDED FOR MUSCLE SPASMS 30 tablet 3   methylphenidate (RITALIN LA) 10 MG 24 hr capsule Take 1 capsule (10 mg total) by mouth daily. 30 capsule 0   VITAMIN D PO      diclofenac (VOLTAREN) 75 MG EC tablet TAKE 1 TABLET(75 MG) BY MOUTH TWICE DAILY (Patient not taking: Reported on 03/15/2022) 30 tablet 0   Diethylpropion HCl CR 75 MG TB24 Take 1 tablet by mouth every morning. (Patient not taking: Reported on 03/15/2022)     fluticasone (FLONASE) 50 MCG/ACT nasal spray Place 2 sprays into both nostrils daily. (Patient not taking: Reported on 03/15/2022) 16 g 6   HYDROcodone-acetaminophen (NORCO) 5-325 MG tablet Take 1 tablet by mouth every 6 (six) hours as needed for moderate pain. (Patient not taking: Reported on 03/15/2022) 10 tablet 0   liothyronine (CYTOMEL) 5 MCG tablet  (Patient not  taking: Reported on 03/15/2022)     methocarbamol (ROBAXIN) 500 MG tablet  (Patient not taking: Reported on 03/15/2022)     ondansetron (ZOFRAN-ODT) 8 MG disintegrating tablet Take 8 mg by mouth every 8 (eight) hours as needed. (Patient not taking: Reported on 03/15/2022)     progesterone (PROMETRIUM) 100 MG capsule Take 100 mg by mouth at bedtime. (Patient not taking: Reported on 03/15/2022)     tamsulosin (FLOMAX) 0.4 MG CAPS capsule Take 0.4 mg by mouth daily. (Patient not taking: Reported on 03/15/2022)     Testosterone 100 MG PLLT See admin instructions. (Patient not taking: Reported on 03/15/2022)     Testosterone 25 MG PLLT See admin instructions. (Patient not taking: Reported on 03/15/2022)     No current facility-administered medications on file prior to visit.   Allergies  Allergen Reactions   Codeine Shortness Of Breath   Topamax [Topiramate]     Cognitive Slowing--types same word over and over. Cannot get out correct words when talking.   Treximet [Sumatriptan-Naproxen Sodium] Shortness Of Breath and Swelling    Throat gets tight.   Penicillins Swelling    Swelling unknown  Has patient had a PCN reaction causing immediate rash, facial/tongue/throat swelling, SOB or lightheadedness with hypotension: Unknown Has patient had a PCN reaction causing severe rash involving mucus membranes or  skin necrosis: No  Has patient had a PCN reaction that required hospitalization: No  Has patient had a PCN reaction occurring within the last 10 years: No  If all of the above answers are "NO", then may proceed with Cep   Social History   Socioeconomic History   Marital status: Married    Spouse name: Not on file   Number of children: Not on file   Years of education: Not on file   Highest education level: Not on file  Occupational History   Not on file  Tobacco Use   Smoking status: Never   Smokeless tobacco: Never  Substance and Sexual Activity   Alcohol use: No   Drug use: No   Sexual  activity: Yes    Birth control/protection: Surgical    Comment: Married to [employer], two kids.  Retired from [employer].  Other Topics Concern   Not on file  Social History Narrative   Not on file   Social Determinants of Health   Financial Resource Strain: Not on file  Food Insecurity: Not on file  Transportation Needs: Not on file  Physical Activity: Not on file  Stress: Not on file  Social Connections: Not on file  Intimate Partner Violence: Not on file   Family History  Problem Relation Age of Onset   COPD Father       Review of Systems  All other systems reviewed and are negative.      Objective:   Physical Exam Vitals reviewed.  Constitutional:      General: She is not in acute distress.    Appearance: She is well-developed. She is not diaphoretic.  HENT:     Head: Normocephalic and atraumatic.     Right Ear: External ear normal.     Left Ear: External ear normal.     Nose: Nose normal.     Mouth/Throat:     Pharynx: No oropharyngeal exudate.  Eyes:     General: No scleral icterus.       Right eye: No discharge.        Left eye: No discharge.     Conjunctiva/sclera: Conjunctivae normal.     Pupils: Pupils are equal, round, and reactive to light.  Neck:     Thyroid: No thyromegaly.     Vascular: No JVD.     Trachea: No tracheal deviation.  Cardiovascular:     Rate and Rhythm: Normal rate and regular rhythm.     Heart sounds: Normal heart sounds. No murmur heard.    No friction rub. No gallop.  Pulmonary:     Effort: Pulmonary effort is normal. No respiratory distress.     Breath sounds: Normal breath sounds. No wheezing or rales.  Chest:     Chest wall: No tenderness.  Abdominal:     General: Bowel sounds are normal. There is no distension.     Palpations: Abdomen is soft. There is no mass.     Tenderness: There is no abdominal tenderness. There is no guarding or rebound.  Musculoskeletal:        General: No tenderness. Normal range of motion.      Cervical back: Normal range of motion and neck supple.  Lymphadenopathy:     Cervical: No cervical adenopathy.  Skin:    General: Skin is warm.     Coloration: Skin is not pale.     Findings: No erythema or rash.  Neurological:     Mental Status: She is alert and  oriented to person, place, and time.     Cranial Nerves: No cranial nerve deficit.     Motor: No abnormal muscle tone.     Coordination: Coordination normal.     Deep Tendon Reflexes: Reflexes are normal and symmetric.  Psychiatric:        Behavior: Behavior normal.        Thought Content: Thought content normal.        Judgment: Judgment normal.           Assessment & Plan:  General medical exam - Plan: CBC with Differential/Platelet, Lipid panel, COMPLETE METABOLIC PANEL WITH GFR, PAP, Thin Prep w/HPV rflx HPV Type 16/18  Pure hypercholesterolemia - Plan: CBC with Differential/Platelet, Lipid panel, COMPLETE METABOLIC PANEL WITH GFR  Cervical cancer screening - Plan: PAP, Thin Prep w/HPV rflx HPV Type 16/18  Colon cancer screening - Plan: Cologuard We discussed colon cancer screening.  Patient would like to try Cologuard.  Her mammogram is up-to-date.  We discussed cervical cancer screening and she is not due for Pap smear until next year so we will defer that until that time.  I will check a CBC CMP and lipid panel.  If her LDL cholesterol is elevated and if she continues to show signs of fatty liver disease, I would recommend a statin to lower her cholesterol.  We also discussed options for weight loss.  I am not comfortable using phentermine with Reglan.  We could consider trying Reginal Lutes if her insurance will cover it particularly given the medical comorbidities she is developing such as hyperlipidemia and fatty liver disease.

## 2022-03-16 ENCOUNTER — Other Ambulatory Visit: Payer: Self-pay | Admitting: Family Medicine

## 2022-03-16 DIAGNOSIS — R7989 Other specified abnormal findings of blood chemistry: Secondary | ICD-10-CM

## 2022-03-17 ENCOUNTER — Encounter: Payer: Self-pay | Admitting: Family Medicine

## 2022-03-17 NOTE — Telephone Encounter (Signed)
Called and spoke w/  pt when over her lab results again, pt has began taking medication for cold , she said that her headache has gone away. Asked pt if she  had a home Covid test take , advised pt take test , and notify us with results.

## 2022-03-18 ENCOUNTER — Other Ambulatory Visit: Payer: Self-pay | Admitting: Family Medicine

## 2022-03-18 MED ORDER — AZITHROMYCIN 250 MG PO TABS: ORAL_TABLET | ORAL | 0 refills | Status: DC

## 2022-03-24 ENCOUNTER — Ambulatory Visit
Admission: RE | Admit: 2022-03-24 | Discharge: 2022-03-24 | Disposition: A | Discharge status: Home / Self Care | Source: Ambulatory Visit | Attending: Family Medicine | Admitting: Family Medicine

## 2022-03-24 DIAGNOSIS — R7989 Other specified abnormal findings of blood chemistry: Secondary | ICD-10-CM

## 2022-03-29 ENCOUNTER — Encounter: Payer: Self-pay | Admitting: Family Medicine

## 2022-03-30 ENCOUNTER — Encounter (HOSPITAL_BASED_OUTPATIENT_CLINIC_OR_DEPARTMENT_OTHER): Payer: Self-pay | Admitting: Emergency Medicine

## 2022-03-30 ENCOUNTER — Emergency Department (HOSPITAL_BASED_OUTPATIENT_CLINIC_OR_DEPARTMENT_OTHER)
Admission: EM | Admit: 2022-03-30 | Discharge: 2022-03-30 | Disposition: A | Discharge status: Home / Self Care | Attending: Emergency Medicine | Admitting: Emergency Medicine

## 2022-03-30 ENCOUNTER — Other Ambulatory Visit: Payer: Self-pay

## 2022-03-30 ENCOUNTER — Emergency Department (HOSPITAL_BASED_OUTPATIENT_CLINIC_OR_DEPARTMENT_OTHER)

## 2022-03-30 ENCOUNTER — Ambulatory Visit: Payer: Self-pay

## 2022-03-30 DIAGNOSIS — R1011 Right upper quadrant pain: Secondary | ICD-10-CM | POA: Diagnosis present

## 2022-03-30 LAB — COMPREHENSIVE METABOLIC PANEL
ALT: 17 U/L (ref 0–44)
AST: 13 U/L — ABNORMAL LOW (ref 15–41)
Albumin: 4.4 g/dL (ref 3.5–5.0)
Alkaline Phosphatase: 71 U/L (ref 38–126)
Anion gap: 12 (ref 5–15)
BUN: 15 mg/dL (ref 6–20)
CO2: 25 mmol/L (ref 22–32)
Calcium: 9.8 mg/dL (ref 8.9–10.3)
Chloride: 105 mmol/L (ref 98–111)
Creatinine, Ser: 0.77 mg/dL (ref 0.44–1.00)
GFR, Estimated: >60 mL/min (ref >60)
Glucose, Bld: 110 mg/dL — ABNORMAL HIGH (ref 70–99)
Potassium: 3.9 mmol/L (ref 3.5–5.1)
Sodium: 142 mmol/L (ref 135–145)
Total Bilirubin: 0.4 mg/dL (ref 0.3–1.2)
Total Protein: 7.2 g/dL (ref 6.5–8.1)

## 2022-03-30 LAB — CBC
HCT: 37.5 % (ref 36.0–46.0)
Hemoglobin: 12.4 g/dL (ref 12.0–15.0)
MCH: 29.4 pg (ref 26.0–34.0)
MCHC: 33.1 g/dL (ref 30.0–36.0)
MCV: 88.9 fL (ref 80.0–100.0)
Platelets: 396 10*3/uL (ref 150–400)
RBC: 4.22 MIL/uL (ref 3.87–5.11)
RDW: 13.2 % (ref 11.5–15.5)
WBC: 9.4 10*3/uL (ref 4.0–10.5)
nRBC: 0 % (ref 0.0–0.2)

## 2022-03-30 LAB — LIPASE, BLOOD: Lipase: 14 U/L (ref 11–51)

## 2022-03-30 MED ORDER — MORPHINE SULFATE (PF) 4 MG/ML IV SOLN
4.0000 mg | Freq: Once | INTRAVENOUS | Status: AC
  Administered 2022-03-30: 4 mg via INTRAVENOUS
  Filled 2022-03-30: qty 1

## 2022-03-30 MED ORDER — ONDANSETRON HCL 4 MG/2ML IJ SOLN
4.0000 mg | Freq: Once | INTRAMUSCULAR | Status: AC
  Administered 2022-03-30: 4 mg via INTRAVENOUS
  Filled 2022-03-30: qty 2

## 2022-03-30 MED ORDER — LACTATED RINGERS IV BOLUS
1000.0000 mL | Freq: Once | INTRAVENOUS | Status: AC
  Administered 2022-03-30: 1000 mL via INTRAVENOUS

## 2022-03-30 NOTE — ED Provider Notes (Signed)
[facility] EMERGENCY DEPT Provider Note   CSN: [id number] Arrival date & time: 03/30/22  1614    History  Chief Complaint  Patient presents with   Abdominal Pain    Tracey Malone is a 46 y.o. female with history of NAFLD who presents to the emergency department for evaluation of right upper quadrant pain that began earlier today upon waking.  Pain is described as constant and sharp.  She called her primary care doctor and they advised her to go to the emergency department for evaluation for her gallbladder.  Patient states that she has had LFTs in the past and recently had a ultrasound of the liver which shows fatty liver.  Currently, patient denies fever, chills, chest pain, shortness of breath, nausea, vomiting, diarrhea and dysuria.   Abdominal Pain      Home Medications Prior to Admission medications   Medication Sig Start Date End Date Taking? Authorizing Provider  azithromycin (ZITHROMAX) 250 MG tablet 2 tabs poqday1, 1 tab poqday 2-5 03/18/22   Donita Brooks, MD  cyanocobalamin (,VITAMIN B-12,) 1000 MCG/ML injection  08/26/20   [provider]  cyclobenzaprine (FLEXERIL) 10 MG tablet TAKE 1 TABLET(10 MG) BY MOUTH THREE TIMES DAILY AS NEEDED FOR MUSCLE SPASMS 08/10/21   Donita Brooks, MD  diclofenac (VOLTAREN) 75 MG EC tablet TAKE 1 TABLET(75 MG) BY MOUTH TWICE DAILY Patient not taking: Reported on 03/15/2022 03/13/21   Donita Brooks, MD  Diethylpropion HCl CR 75 MG TB24 Take 1 tablet by mouth every morning. Patient not taking: Reported on 03/15/2022 10/20/21   [provider]  fluticasone (FLONASE) 50 MCG/ACT nasal spray Place 2 sprays into both nostrils daily. Patient not taking: Reported on 03/15/2022 03/04/20   McVey, Madelaine Bhat, PA-C  HYDROcodone-acetaminophen (NORCO) 5-325 MG tablet Take 1 tablet by mouth every 6 (six) hours as needed for moderate pain. Patient not taking: Reported on 03/15/2022 05/11/21   Donita Brooks, MD   liothyronine (CYTOMEL) 5 MCG tablet  05/14/20   [provider]  methocarbamol (ROBAXIN) 500 MG tablet  06/12/21   [provider]  methylphenidate (RITALIN LA) 10 MG 24 hr capsule Take 1 capsule (10 mg total) by mouth daily. 03/14/22   Donita Brooks, MD  ondansetron (ZOFRAN-ODT) 8 MG disintegrating tablet Take 8 mg by mouth every 8 (eight) hours as needed. Patient not taking: Reported on 03/15/2022 09/11/21   [provider]  pantoprazole (PROTONIX) 40 MG tablet Take 1 tablet (40 mg total) by mouth daily. 03/15/22   Donita Brooks, MD  progesterone (PROMETRIUM) 100 MG capsule Take 100 mg by mouth at bedtime. Patient not taking: Reported on 03/15/2022 06/22/21   [provider]  tamsulosin (FLOMAX) 0.4 MG CAPS capsule Take 0.4 mg by mouth daily. Patient not taking: Reported on 03/15/2022 03/13/21   [provider]  Testosterone 100 MG PLLT See admin instructions. Patient not taking: Reported on 03/15/2022 08/26/20   [provider]  Testosterone 25 MG PLLT See admin instructions. Patient not taking: Reported on 03/15/2022 08/26/20   [provider]  VITAMIN D PO     [provider]      Allergies    Codeine, Topamax [topiramate], Treximet [sumatriptan-naproxen sodium], and Penicillins    Review of Systems   Review of Systems  Gastrointestinal:  Positive for abdominal pain.    Physical Exam Updated Vital Signs BP 131/74 (BP Location: Right Arm)   Pulse 84   Temp 98.8 F (37.1  C)   Resp 16   Ht 5\' 5"  (1.651 m)   Wt 80.3 kg   LMP 03/07/2022 (Approximate)   SpO2 99%   BMI 29.45 kg/m  Physical Exam Vitals and nursing note reviewed.  Constitutional:      General: She is not in acute distress.    Appearance: She is not ill-appearing.  HENT:     Head: Atraumatic.  Eyes:     Conjunctiva/sclera: Conjunctivae normal.  Cardiovascular:     Rate and Rhythm: Normal rate and regular rhythm.     Pulses: Normal pulses.      Heart sounds: No murmur heard. Pulmonary:     Effort: Pulmonary effort is normal. No respiratory distress.     Breath sounds: Normal breath sounds.  Abdominal:     General: Abdomen is flat. There is no distension.     Palpations: Abdomen is soft.     Tenderness: There is abdominal tenderness in the right upper quadrant. There is no right CVA tenderness or left CVA tenderness. Negative signs include Murphy's sign and McBurney's sign.  Musculoskeletal:        General: Normal range of motion.     Cervical back: Normal range of motion.  Skin:    General: Skin is warm and dry.     Capillary Refill: Capillary refill takes less than 2 seconds.  Neurological:     General: No focal deficit present.     Mental Status: She is alert.  Psychiatric:        Mood and Affect: Mood normal.     ED Results / Procedures / Treatments   Labs (all labs ordered are listed, but only abnormal results are displayed) Labs Reviewed  COMPREHENSIVE METABOLIC PANEL - Abnormal; Notable for the following components:      Result Value   Glucose, Bld 110 (*)    AST 13 (*)    All other components within normal limits  LIPASE, BLOOD  CBC  URINALYSIS, ROUTINE W REFLEX MICROSCOPIC  PREGNANCY, URINE    EKG None  Radiology 05/07/2022 Abdomen Limited RUQ (LIVER/GB)  Result Date: 03/30/2022 CLINICAL DATA:  Right upper quadrant pain EXAM: ULTRASOUND ABDOMEN LIMITED RIGHT UPPER QUADRANT COMPARISON:  03/24/2022 ultrasound. FINDINGS: Gallbladder: Gallbladder is contracted. This limits evaluation. No tenderness to gallbladder palpation. Common bile duct: Diameter: Normal, 2 mm. Liver: Again identified is nonspecific mild heterogeneity of the hepatic parenchyma. Portal vein is patent on color Doppler imaging with normal direction of blood flow towards the liver. Other: None. IMPRESSION: Contracted gallbladder. Patient states that they are NPO for 6.5 hours. This appearance can be seen with chronic cholecystitis, but is  nonspecific. No other explanation for right upper quadrant pain. Electronically Signed   By: 03/26/2022 M.D.   On: 03/30/2022 18:40    Procedures Procedures    Medications Ordered in ED Medications  lactated ringers bolus 1,000 mL (1,000 mLs Intravenous New Bag/Given 03/30/22 1831)  morphine (PF) 4 MG/ML injection 4 mg (4 mg Intravenous Given 03/30/22 1832)  ondansetron (ZOFRAN) injection 4 mg (4 mg Intravenous Given 03/30/22 1828)    ED Course/ Medical Decision Making/ A&P                           Medical Decision Making Amount and/or Complexity of Data Reviewed Labs: ordered. Radiology: ordered.  Risk Prescription drug management.   Social determinants of health:  Social History   Socioeconomic History   Marital status: Married  Spouse name: Not on file   Number of children: Not on file   Years of education: Not on file   Highest education level: Not on file  Occupational History   Not on file  Tobacco Use   Smoking status: Never   Smokeless tobacco: Never  Substance and Sexual Activity   Alcohol use: No    Comment: ocasional   Drug use: No   Sexual activity: Yes    Birth control/protection: Surgical    Comment: Married to [employer], two kids.  Retired from [employer].  Other Topics Concern   Not on file  Social History Narrative   Not on file   Social Determinants of Health   Financial Resource Strain: Not on file  Food Insecurity: Not on file  Transportation Needs: Not on file  Physical Activity: Not on file  Stress: Not on file  Social Connections: Not on file  Intimate Partner Violence: Not on file     Initial impression:  This patient presents to the ED for concern of right upper quadrant pain, this involves an extensive number of treatment options, and is a complaint that carries with it a high risk of complications and morbidity.   Differentials include nephrolithiasis, pyelonephritis, cholecystitis, choledocholithiasis, cholelithiasis,  pancreatitis.   Comorbidities affecting care:  NAFLD  Additional history obtained: RUQ ultrasound obtained 03/24/2022 with mild heterogeneity of the liver without hepatic lesions or biliary duct dilation  Lab Tests  I Ordered, reviewed, and interpreted labs and EKG.  The pertinent results include:  CMP unremarkable Lipase normal CBC normal  Imaging Studies ordered:  I ordered imaging studies including  Right upper quadrant ultrasound notes contracted liver without gallstones or acute infection I independently visualized and interpreted imaging and I agree with the radiologist interpretation.   Medicines ordered and prescription drug management:  I ordered medication including: 1 L LR bolus Morphine 4 mg Zofran 4 mg Reevaluation of the patient after these medicines showed that the patient improved I have reviewed the patients home medicines and have made adjustments as needed   ED Course/Re-evaluation: 46 year old female presents to the emergency department for evaluation of right upper quadrant pain that began upon waking this morning.  Vitals are without significant abnormality.  On exam, she has tenderness to the right upper quadrant without positive Murphy sign.  Negative CVA tenderness bilaterally.  Abdomen is otherwise soft, nondistended.  Lungs CTA bilaterally.  Labs are overall reassuring-her LFTs were normal, negative lipase and no leukocytosis.  Right upper quadrant ultrasound mentions contracted gallbladder that is nonspecific and possibly indicates chronic cholecystitis.  However, patient's labs do not support this diagnosis as she does not have an elevated white count or any changes in her liver values.  She also has recent right upper quadrant ultrasound as well that does not show this finding.  Patient may require HIDA scan for further characterization.  At this time, there does not seem to be an emergent cause of her right upper quadrant pain.  I recommend that she  follow-up with her PCP and discuss whether or not she needs additional evaluation with HIDA scan.  Patient expresses understanding and is amenable to plan.  Disposition:  After consideration of the diagnostic results, physical exam, history and the patients response to treatment feel that the patent would benefit from discharge.   RUQ pain: Plan and management as described above. Discharged home in good condition.  Final Clinical Impression(s) / ED Diagnoses Final diagnoses:  RUQ pain    Rx /  DC Orders ED Discharge Orders     None         Delight Ovens 03/30/22 Vicki Mallet, MD 04/04/22 [phone]

## 2022-03-30 NOTE — Telephone Encounter (Signed)
  Chief Complaint: abdominal pain Symptoms: R sided abdominal pain, constant  Frequency: since yesterday Pertinent Negatives: Patient denies diarrhea, Nause, vomiting Disposition: [x] ED /[] Urgent Care (no appt availability in office) / [] Appointment(In office/virtual)/ []  [facility]/ [] Home Care/ [] Refused Recommended Disposition /[] [facility]/ []  Follow-up with PCP Additional Notes: got triage call from [place], [facility] d/t no appts. pt unsure what going on to cause pt. Doesn't feel like she has pulled a muscle. Earlier last week after physical pt had high fever and felt really sick. Advised pt based on symptoms and office not having any appts to go to ED for evaluation. Pt was hesitant at first but after explaining importance, pt willing to go.   Reason for Disposition  [1] MILD-MODERATE pain AND [2] constant AND [3] present > 2 hours  Answer Assessment - Initial Assessment Questions 1. LOCATION: "Where does it hurt?"      Under the ribs beside the belly button  3. ONSET: "When did the pain begin?" (e.g., minutes, hours or days ago)      yesterday 5. PATTERN "Does the pain come and go, or is it constant?"    - If constant: "Is it getting better, staying the same, or worsening?"      (Note: Constant means the pain never goes away completely; most serious pain is constant and it progresses)     - If intermittent: "How long does it last?" "Do you have pain now?"     (Note: Intermittent means the pain goes away completely between bouts)     Constant  6. SEVERITY: "How bad is the pain?"  (e.g., Scale 1-10; mild, moderate, or severe)   - MILD (1-3): doesn't interfere with normal activities, abdomen soft and not tender to touch    - MODERATE (4-7): interferes with normal activities or awakens from sleep, abdomen tender to touch    - SEVERE (8-10): excruciating pain, doubled over, unable to do any normal activities      High pain tolerance   9. RELIEVING/AGGRAVATING FACTORS:  "What makes it better or worse?" (e.g., movement, antacids, bowel movement)     Movement, touch, coughing  10. OTHER SYMPTOMS: "Do you have any other symptoms?" (e.g., back pain, diarrhea, fever, urination pain, vomiting)       no  Protocols used: Abdominal Pain - Female-A-AH

## 2022-03-31 ENCOUNTER — Telehealth: Payer: Self-pay

## 2022-03-31 NOTE — Telephone Encounter (Signed)
FYI  Pt called to stating that she in the ED yesterday due to pain in the RUQ. However, pt is still having the same pain in the same area. Per ED Dr. For pt to follow w/PCP for a referral to the GI.   Pt would like for you to review her labs from ED and her report, especially her liver results. She is just a little concern.  Pt is coming in tom at 415pm

## 2022-04-01 ENCOUNTER — Ambulatory Visit: Admitting: Family Medicine

## 2022-04-01 ENCOUNTER — Other Ambulatory Visit: Payer: Self-pay | Admitting: Family Medicine

## 2022-04-01 VITALS — BP 120/82 | HR 93 | Temp 98.5°F | Ht 65.0 in | Wt 177.0 lb

## 2022-04-01 DIAGNOSIS — R1011 Right upper quadrant pain: Secondary | ICD-10-CM

## 2022-04-01 MED ORDER — DICYCLOMINE HCL 20 MG PO TABS: 20.0000 mg | ORAL_TABLET | Freq: Three times a day (TID) | ORAL | 0 refills | Status: DC

## 2022-04-01 NOTE — Progress Notes (Signed)
Subjective:    Patient ID: Tracey Malone, female    DOB: August 12, 1976, 46 y.o.   MRN: [id number]  HPI I saw the patient recently for a physical exam on June 12.  Lab work at that time showed mild elevations of her liver function test.  Right upper quadrant ultrasound revealed no abnormalities.  Shortly thereafter she developed a viral illness with fever chills headache congestion.  Ultimately, I felt the patient has sinusitis so I called out a Z-Pak.  The head congestion and sinus pain resolved after the antibiotic.  However Tuesday of this week she developed sharp pain in her right abdomen.  She went to an urgent care where they referred her to the emergency room.  In the emergency room, CMP was normal.  There were no elevations in her liver function test.  CBC was normal there was no leukocytosis.  Right upper quadrant ultrasound showed a contracted gallbladder but was otherwise negative.  She is here today for follow-up.  She denies any fevers or chills.  She denies any nausea or vomiting.  She denies any melena or hematochezia.  She does have some mild Ulyess Mort however she does have vague tenderness in her right abdomen.  There is no guarding or rebound or rigidity.  There is no pain at McBurney's point.  She has a negative Murphy sign. Past Medical History:  Diagnosis Date   ADD (attention deficit disorder)    Bell's palsy    Migraines    Past Surgical History:  Procedure Laterality Date   HEMORRHOID SURGERY     INCONTINENCE SURGERY     TUBAL LIGATION     Current Outpatient Medications on File Prior to Visit  Medication Sig Dispense Refill   azithromycin (ZITHROMAX) 250 MG tablet 2 tabs poqday1, 1 tab poqday 2-5 6 tablet 0   cyanocobalamin (,VITAMIN B-12,) 1000 MCG/ML injection      cyclobenzaprine (FLEXERIL) 10 MG tablet TAKE 1 TABLET(10 MG) BY MOUTH THREE TIMES DAILY AS NEEDED FOR MUSCLE SPASMS 30 tablet 3   methylphenidate (RITALIN LA) 10 MG 24 hr capsule Take 1 capsule (10 mg total) by  mouth daily. 30 capsule 0   diclofenac (VOLTAREN) 75 MG EC tablet TAKE 1 TABLET(75 MG) BY MOUTH TWICE DAILY (Patient not taking: Reported on 04/01/2022) 30 tablet 0   Diethylpropion HCl CR 75 MG TB24 Take 1 tablet by mouth every morning. (Patient not taking: Reported on 04/01/2022)     fluticasone (FLONASE) 50 MCG/ACT nasal spray Place 2 sprays into both nostrils daily. (Patient not taking: Reported on 04/01/2022) 16 g 6   HYDROcodone-acetaminophen (NORCO) 5-325 MG tablet Take 1 tablet by mouth every 6 (six) hours as needed for moderate pain. (Patient not taking: Reported on 04/01/2022) 10 tablet 0   liothyronine (CYTOMEL) 5 MCG tablet  (Patient not taking: Reported on 04/01/2022)     methocarbamol (ROBAXIN) 500 MG tablet  (Patient not taking: Reported on 04/01/2022)     ondansetron (ZOFRAN-ODT) 8 MG disintegrating tablet Take 8 mg by mouth every 8 (eight) hours as needed. (Patient not taking: Reported on 04/01/2022)     pantoprazole (PROTONIX) 40 MG tablet Take 1 tablet (40 mg total) by mouth daily. (Patient not taking: Reported on 04/01/2022) 30 tablet 3   progesterone (PROMETRIUM) 100 MG capsule Take 100 mg by mouth at bedtime. (Patient not taking: Reported on 04/01/2022)     tamsulosin (FLOMAX) 0.4 MG CAPS capsule Take 0.4 mg by mouth daily. (Patient not taking: Reported on 04/01/2022)  Testosterone 100 MG PLLT See admin instructions. (Patient not taking: Reported on 04/01/2022)     Testosterone 25 MG PLLT See admin instructions. (Patient not taking: Reported on 04/01/2022)     VITAMIN D PO  (Patient not taking: Reported on 04/01/2022)     No current facility-administered medications on file prior to visit.   Allergies  Allergen Reactions   Codeine Shortness Of Breath   Topamax [Topiramate]     Cognitive Slowing--types same word over and over. Cannot get out correct words when talking.   Treximet [Sumatriptan-Naproxen Sodium] Shortness Of Breath and Swelling    Throat gets tight.   Penicillins  Swelling    Swelling unknown  Has patient had a PCN reaction causing immediate rash, facial/tongue/throat swelling, SOB or lightheadedness with hypotension: Unknown Has patient had a PCN reaction causing severe rash involving mucus membranes or skin necrosis: No  Has patient had a PCN reaction that required hospitalization: No  Has patient had a PCN reaction occurring within the last 10 years: No  If all of the above answers are "NO", then may proceed with Cep   Social History   Socioeconomic History   Marital status: Married    Spouse name: Not on file   Number of children: Not on file   Years of education: Not on file   Highest education level: Not on file  Occupational History   Not on file  Tobacco Use   Smoking status: Never   Smokeless tobacco: Never  Substance and Sexual Activity   Alcohol use: No    Comment: ocasional   Drug use: No   Sexual activity: Yes    Birth control/protection: Surgical    Comment: Married to [employer], two kids.  Retired from [employer].  Other Topics Concern   Not on file  Social History Narrative   Not on file   Social Determinants of Health   Financial Resource Strain: Not on file  Food Insecurity: Not on file  Transportation Needs: Not on file  Physical Activity: Not on file  Stress: Not on file  Social Connections: Not on file  Intimate Partner Violence: Not on file   Family History  Problem Relation Age of Onset   COPD Father       Review of Systems  All other systems reviewed and are negative.      Objective:   Physical Exam Vitals reviewed.  Constitutional:      General: She is not in acute distress.    Appearance: She is well-developed. She is not diaphoretic.  HENT:     Head: Normocephalic and atraumatic.     Right Ear: External ear normal.     Left Ear: External ear normal.     Nose: Nose normal.     Mouth/Throat:     Pharynx: No oropharyngeal exudate.  Eyes:     General: No scleral icterus.       Right eye: No  discharge.        Left eye: No discharge.     Conjunctiva/sclera: Conjunctivae normal.     Pupils: Pupils are equal, round, and reactive to light.  Neck:     Thyroid: No thyromegaly.     Vascular: No JVD.     Trachea: No tracheal deviation.  Cardiovascular:     Rate and Rhythm: Normal rate and regular rhythm.     Heart sounds: Normal heart sounds. No murmur heard.    No friction rub. No gallop.  Pulmonary:  Effort: Pulmonary effort is normal. No respiratory distress.     Breath sounds: Normal breath sounds. No wheezing or rales.  Chest:     Chest wall: No tenderness.  Abdominal:     General: Bowel sounds are normal. There is no distension.     Palpations: Abdomen is soft. There is no mass.     Tenderness: There is no abdominal tenderness. There is no guarding or rebound.  Musculoskeletal:        General: No tenderness. Normal range of motion.     Cervical back: Normal range of motion and neck supple.  Lymphadenopathy:     Cervical: No cervical adenopathy.  Skin:    General: Skin is warm.     Coloration: Skin is not pale.     Findings: No erythema or rash.  Neurological:     Mental Status: She is alert and oriented to person, place, and time.     Cranial Nerves: No cranial nerve deficit.     Motor: No abnormal muscle tone.     Coordination: Coordination normal.     Deep Tendon Reflexes: Reflexes are normal and symmetric.  Psychiatric:        Behavior: Behavior normal.        Thought Content: Thought content normal.        Judgment: Judgment normal.           Assessment & Plan:  Right upper quadrant abdominal pain Pain is nonspecific.  Given the recent viral illness, diarrhea, and the pain, I suspect that she may have some type of enteritis.  Her physical exam today is reassuring so I am going to treat the patient with Bentyl 20 mg every 6 hours as needed.  The emergency room recommended a GI consultation for the pain.  At the present time there is no evidence of an  acute abdomen.  The question is whether the patient would benefit from GI consultation for endoscopy or whether she would benefit from a HIDA scan.  I recommended that we try Bentyl over the weekend to see if this will help.  If the pain disappears, no further work-up is necessary.  If the pain persist I will proceed with a GI consultation.

## 2022-04-02 NOTE — Telephone Encounter (Signed)
Pt talked about results w/Dr during OV 04/01/22 Nothing at this time.

## 2022-04-15 ENCOUNTER — Encounter: Payer: Self-pay | Admitting: Family Medicine

## 2022-04-16 ENCOUNTER — Other Ambulatory Visit: Payer: Self-pay | Admitting: Family Medicine

## 2022-04-16 MED ORDER — PHENTERMINE HCL 37.5 MG PO TABS: 37.5000 mg | ORAL_TABLET | Freq: Every day | ORAL | 2 refills | Status: DC

## 2022-04-16 NOTE — Telephone Encounter (Signed)
Pt is aware of Rx sent to pharmacy

## 2022-04-17 ENCOUNTER — Telehealth: Payer: Self-pay | Admitting: Family Medicine

## 2022-04-17 NOTE — Telephone Encounter (Signed)
Pharmacy at [facility] calling for clarification on medicine, apparently they never received prescriptions for phentermine, even thought it looks like it was sent yesterday.  Gave verbal for 1 month and follow up with Dr Tanya Nones.

## 2022-05-19 ENCOUNTER — Encounter: Payer: Self-pay | Admitting: Family Medicine

## 2022-05-21 ENCOUNTER — Other Ambulatory Visit: Payer: Self-pay | Admitting: Family Medicine

## 2022-05-21 MED ORDER — PHENTERMINE HCL 37.5 MG PO CAPS: 37.5000 mg | ORAL_CAPSULE | ORAL | 1 refills | Status: DC

## 2022-05-24 ENCOUNTER — Other Ambulatory Visit: Payer: Self-pay | Admitting: Family Medicine

## 2022-05-25 MED ORDER — METHYLPHENIDATE HCL ER (LA) 10 MG PO CP24: 10.0000 mg | ORAL_CAPSULE | Freq: Every day | ORAL | 0 refills | Status: DC

## 2022-05-27 DIAGNOSIS — E663 Overweight: Secondary | ICD-10-CM | POA: Insufficient documentation

## 2022-06-03 ENCOUNTER — Telehealth: Payer: Self-pay

## 2022-06-03 NOTE — Telephone Encounter (Addendum)
Lillie Columbia KeyDenny Peon - PA Case ID: [id number] - Rx #: [id number]   Outcome Approvedon August 24  CaseId:[id number];Status:Approved;Review Type:Prior Auth;Coverage Start Date:04/27/2022;Coverage End Date:08/25/2022; Drug Phentermine HCl 37.5MG  capsules  Send pt msg re PA via pt's MyChart.

## 2022-06-16 ENCOUNTER — Encounter: Payer: Self-pay | Admitting: Family Medicine

## 2022-07-02 ENCOUNTER — Other Ambulatory Visit: Payer: Self-pay | Admitting: Family Medicine

## 2022-07-05 MED ORDER — METHYLPHENIDATE HCL ER (LA) 10 MG PO CP24
10.0000 mg | ORAL_CAPSULE | Freq: Every day | ORAL | 0 refills | Status: DC
Start: 2022-07-05 — End: 2022-10-27

## 2022-07-22 ENCOUNTER — Other Ambulatory Visit: Payer: Self-pay | Admitting: Family Medicine

## 2022-07-22 NOTE — Telephone Encounter (Signed)
Requested Prescriptions  Pending Prescriptions Disp Refills  . pantoprazole (PROTONIX) 40 MG tablet [Pharmacy Med Name: PANTOPRAZOLE 40MG  TABLETS] 30 tablet 3    Sig: TAKE 1 TABLET(40 MG) BY MOUTH DAILY     Gastroenterology: Proton Pump Inhibitors Failed - 07/22/2022 11:10 AM      Failed - Valid encounter within last 12 months    Recent Outpatient Visits          1 year ago Lateral epicondylitis of left elbow   [place] Susy Frizzle, MD   2 years ago Cervical cancer screening   [place] Susy Frizzle, MD   2 years ago Lateral epicondylitis of left elbow   [place] Susy Frizzle, MD   2 years ago Acute cystitis with hematuria   [place], Modena Nunnery, MD   2 years ago Attention deficit hyperactivity disorder (ADHD), predominantly inattentive type   [place], Cammie Mcgee, MD

## 2022-10-01 ENCOUNTER — Encounter: Payer: Self-pay | Admitting: Family Medicine

## 2022-10-08 ENCOUNTER — Encounter: Payer: Self-pay | Admitting: Family Medicine

## 2022-10-08 ENCOUNTER — Ambulatory Visit (INDEPENDENT_AMBULATORY_CARE_PROVIDER_SITE_OTHER): Admitting: Family Medicine

## 2022-10-08 VITALS — BP 124/68 | HR 91 | Ht 65.0 in | Wt 168.4 lb

## 2022-10-08 DIAGNOSIS — F411 Generalized anxiety disorder: Secondary | ICD-10-CM

## 2022-10-08 MED ORDER — ALPRAZOLAM 0.5 MG PO TABS: 0.5000 mg | ORAL_TABLET | Freq: Three times a day (TID) | ORAL | 0 refills | Status: DC | PRN

## 2022-10-08 MED ORDER — ESCITALOPRAM OXALATE 10 MG PO TABS: 10.0000 mg | ORAL_TABLET | Freq: Every day | ORAL | 5 refills | Status: DC

## 2022-10-08 NOTE — Progress Notes (Signed)
Subjective:    Patient ID: Tracey Malone, female    DOB: [birth year], 47 y.o.   MRN: [id number]  Anxiety    Patient presents today complaining of anxiety.  She has dealt with anxiety ever since her brother was killed.  He was murdered from behind.  She has had panic attacks whenever people walk behind her ever since that time.  However recently her anxiety has gotten worse.  Her mother is in the hospital.  She is dealing with panic attacks and anxiety now on a daily basis.  She denies any depression or anhedonia.  She denies trouble sleeping.  She denies any racing thoughts or slurred speech or tangential thoughts.  She denies any hallucinations or suicidal ideations.  However she does report feeling anxious on a daily basis and at times feeling panic attacks.  In the past we Xanax.  But Past Medical History:  Diagnosis Date   ADD (attention deficit disorder)    Bell's palsy    Migraines    Past Surgical History:  Procedure Laterality Date   HEMORRHOID SURGERY     INCONTINENCE SURGERY     TUBAL LIGATION     Current Outpatient Medications on File Prior to Visit  Medication Sig Dispense Refill   cyanocobalamin (,VITAMIN B-12,) 1000 MCG/ML injection      methylphenidate (RITALIN LA) 10 MG 24 hr capsule Take 1 capsule (10 mg total) by mouth daily. 30 capsule 0   VITAMIN D PO      pantoprazole (PROTONIX) 40 MG tablet TAKE 1 TABLET(40 MG) BY MOUTH DAILY (Patient not taking: Reported on 10/08/2022) 30 tablet 3   No current facility-administered medications on file prior to visit.   Allergies  Allergen Reactions   Codeine Shortness Of Breath   Topamax [Topiramate]     Cognitive Slowing--types same word over and over. Cannot get out correct words when talking.   Treximet [Sumatriptan-Naproxen Sodium] Shortness Of Breath and Swelling    Throat gets tight.   Penicillins Swelling    Swelling unknown  Has patient had a PCN reaction causing immediate rash, facial/tongue/throat swelling,  SOB or lightheadedness with hypotension: Unknown Has patient had a PCN reaction causing severe rash involving mucus membranes or skin necrosis: No  Has patient had a PCN reaction that required hospitalization: No  Has patient had a PCN reaction occurring within the last 10 years: No  If all of the above answers are "NO", then may proceed with Cep   Social History   Socioeconomic History   Marital status: Married    Spouse name: Not on file   Number of children: Not on file   Years of education: Not on file   Highest education level: Not on file  Occupational History   Not on file  Tobacco Use   Smoking status: Never   Smokeless tobacco: Never  Substance and Sexual Activity   Alcohol use: No    Comment: ocasional   Drug use: No   Sexual activity: Yes    Birth control/protection: Surgical    Comment: Married to [employer], two kids.  Retired from [employer]  Other Topics Concern   Not on file  Social History Narrative   Not on file   Social Determinants of Health   Financial Resource Strain: Not on file  Food Insecurity: Not on file  Transportation Needs: Not on file  Physical Activity: Not on file  Stress: Not on file  Social Connections: Not on file  Intimate Partner Violence: Not  on file   Family History  Problem Relation Age of Onset   COPD Father       Review of Systems  All other systems reviewed and are negative.      Objective:   Physical Exam Vitals reviewed.  Constitutional:      General: She is not in acute distress.    Appearance: She is well-developed. She is not diaphoretic.  HENT:     Head: Normocephalic and atraumatic.     Right Ear: External ear normal.     Left Ear: External ear normal.     Nose: Nose normal.     Mouth/Throat:     Pharynx: No oropharyngeal exudate.  Eyes:     General: No scleral icterus.       Right eye: No discharge.        Left eye: No discharge.     Conjunctiva/sclera: Conjunctivae normal.     Pupils: Pupils are equal,  round, and reactive to light.  Neck:     Thyroid: No thyromegaly.     Vascular: No JVD.     Trachea: No tracheal deviation.  Cardiovascular:     Rate and Rhythm: Normal rate and regular rhythm.     Heart sounds: Normal heart sounds. No murmur heard.    No friction rub. No gallop.  Pulmonary:     Effort: Pulmonary effort is normal. No respiratory distress.     Breath sounds: Normal breath sounds. No wheezing or rales.  Chest:     Chest wall: No tenderness.  Abdominal:     General: Bowel sounds are normal. There is no distension.     Palpations: Abdomen is soft. There is no mass.     Tenderness: There is no abdominal tenderness. There is no guarding or rebound.  Musculoskeletal:        General: No tenderness. Normal range of motion.     Cervical back: Normal range of motion and neck supple.  Lymphadenopathy:     Cervical: No cervical adenopathy.  Skin:    General: Skin is warm.     Coloration: Skin is not pale.     Findings: No erythema or rash.  Neurological:     Mental Status: She is alert and oriented to person, place, and time.     Cranial Nerves: No cranial nerve deficit.     Motor: No abnormal muscle tone.     Coordination: Coordination normal.     Deep Tendon Reflexes: Reflexes are normal and symmetric.  Psychiatric:        Behavior: Behavior normal.        Thought Content: Thought content normal.        Judgment: Judgment normal.           Assessment & Plan:  GAD (generalized anxiety disorder) I believe the patient is dealing with generalized anxiety disorder made worse by stress.  I have given her Xanax 0.5 mg p.o. every 8 hours as needed anxiety.  She can use that for panic attacks however I am also going out of her Lexapro 10 mg daily to help try to manage and control the anxiety and hopefully in the long-term reduce her dependency on the xanax.  Recheck in 1 month

## 2022-10-27 ENCOUNTER — Other Ambulatory Visit: Payer: Self-pay | Admitting: Family Medicine

## 2022-10-28 MED ORDER — METHYLPHENIDATE HCL ER (LA) 10 MG PO CP24
10.0000 mg | ORAL_CAPSULE | Freq: Every day | ORAL | 0 refills | Status: DC
Start: 2022-10-28 — End: 2023-01-12

## 2022-10-29 ENCOUNTER — Other Ambulatory Visit: Payer: Self-pay | Admitting: Family Medicine

## 2022-10-29 NOTE — Telephone Encounter (Signed)
Requested Prescriptions  Pending Prescriptions Disp Refills   pantoprazole (PROTONIX) 40 MG tablet [Pharmacy Med Name: PANTOPRAZOLE 40MG  TABLETS] 30 tablet 3    Sig: TAKE 1 TABLET(40 MG) BY MOUTH DAILY     Gastroenterology: Proton Pump Inhibitors Failed - 10/29/2022  8:04 AM      Failed - Valid encounter within last 12 months    Recent Outpatient Visits           1 year ago Lateral epicondylitis of left elbow   [place] Susy Frizzle, MD   2 years ago Cervical cancer screening   [place] Susy Frizzle, MD   2 years ago Lateral epicondylitis of left elbow   [place] Susy Frizzle, MD   2 years ago Acute cystitis with hematuria   [place], Modena Nunnery, MD   2 years ago Attention deficit hyperactivity disorder (ADHD), predominantly inattentive type   [place], Cammie Mcgee, MD

## 2022-11-04 ENCOUNTER — Other Ambulatory Visit: Payer: Self-pay | Admitting: Family Medicine

## 2022-11-15 ENCOUNTER — Other Ambulatory Visit: Payer: Self-pay | Admitting: Family Medicine

## 2022-11-15 MED ORDER — ALPRAZOLAM 0.5 MG PO TABS: 0.5000 mg | ORAL_TABLET | Freq: Three times a day (TID) | ORAL | 0 refills | Status: DC | PRN

## 2022-12-20 DIAGNOSIS — R202 Paresthesia of skin: Secondary | ICD-10-CM | POA: Insufficient documentation

## 2022-12-20 DIAGNOSIS — M7661 Achilles tendinitis, right leg: Secondary | ICD-10-CM | POA: Insufficient documentation

## 2023-01-03 ENCOUNTER — Other Ambulatory Visit: Payer: Self-pay | Admitting: Family Medicine

## 2023-01-04 NOTE — Telephone Encounter (Signed)
Requested medication (s) are due for refill today - yes #20  Requested medication (s) are on the active medication list -yes  Future visit scheduled -no  Last refill: 11/15/22  Notes to clinic: non delegated Rx  Requested Prescriptions  Pending Prescriptions Disp Refills   ALPRAZolam (XANAX) 0.5 MG tablet [Pharmacy Med Name: ALPRAZOLAM 0.5MG  TABLETS] 20 tablet     Sig: TAKE 1 TABLET(0.5 MG) BY MOUTH THREE TIMES DAILY AS NEEDED FOR ANXIETY     Not Delegated - Psychiatry: Anxiolytics/Hypnotics 2 Failed - 01/03/2023  4:33 PM      Failed - This refill cannot be delegated      Failed - Urine Drug Screen completed in last 360 days      Failed - Valid encounter within last 6 months    Recent Outpatient Visits           1 year ago Lateral epicondylitis of left elbow   [place] Susy Frizzle, MD   2 years ago Cervical cancer screening   [place], Cammie Mcgee, MD   2 years ago Lateral epicondylitis of left elbow   [place] Susy Frizzle, MD   2 years ago Acute cystitis with hematuria   [place], Modena Nunnery, MD   3 years ago Attention deficit hyperactivity disorder (ADHD), predominantly inattentive type   [place], Cammie Mcgee, MD              Passed - Patient is not pregnant         Requested Prescriptions  Pending Prescriptions Disp Refills   ALPRAZolam (XANAX) 0.5 MG tablet [Pharmacy Med Name: ALPRAZOLAM 0.5MG  TABLETS] 20 tablet     Sig: TAKE 1 TABLET(0.5 MG) BY MOUTH THREE TIMES DAILY AS NEEDED FOR ANXIETY     Not Delegated - Psychiatry: Anxiolytics/Hypnotics 2 Failed - 01/03/2023  4:33 PM      Failed - This refill cannot be delegated      Failed - Urine Drug Screen completed in last 360 days      Failed - Valid encounter within last 6 months    Recent Outpatient Visits           1 year ago Lateral epicondylitis of left elbow   [place] Susy Frizzle, MD   2 years ago Cervical cancer screening   [place] Susy Frizzle, MD   2 years ago Lateral epicondylitis of left elbow   [place] Susy Frizzle, MD   2 years ago Acute cystitis with hematuria   [place], Modena Nunnery, MD   3 years ago Attention deficit hyperactivity disorder (ADHD), predominantly inattentive type   [place], Cammie Mcgee, MD              Passed - Patient is not pregnant

## 2023-01-12 ENCOUNTER — Other Ambulatory Visit: Payer: Self-pay | Admitting: Family Medicine

## 2023-01-13 MED ORDER — METHYLPHENIDATE HCL ER (LA) 10 MG PO CP24
10.0000 mg | ORAL_CAPSULE | Freq: Every day | ORAL | 0 refills | Status: DC
Start: 2023-01-13 — End: 2023-07-13

## 2023-03-14 ENCOUNTER — Telehealth: Admitting: Physician Assistant

## 2023-03-14 DIAGNOSIS — B9689 Other specified bacterial agents as the cause of diseases classified elsewhere: Secondary | ICD-10-CM | POA: Diagnosis not present

## 2023-03-14 DIAGNOSIS — J069 Acute upper respiratory infection, unspecified: Secondary | ICD-10-CM

## 2023-03-14 MED ORDER — FLUTICASONE PROPIONATE 50 MCG/ACT NA SUSP
2.0000 | Freq: Every day | NASAL | 0 refills | Status: DC
Start: 2023-03-14 — End: 2023-12-12

## 2023-03-14 MED ORDER — BENZONATATE 100 MG PO CAPS
100.0000 mg | ORAL_CAPSULE | Freq: Three times a day (TID) | ORAL | 0 refills | Status: DC | PRN
Start: 2023-03-14 — End: 2023-06-29

## 2023-03-14 MED ORDER — DOXYCYCLINE HYCLATE 100 MG PO TABS
100.0000 mg | ORAL_TABLET | Freq: Two times a day (BID) | ORAL | 0 refills | Status: DC
Start: 2023-03-14 — End: 2023-05-11

## 2023-03-14 NOTE — Progress Notes (Signed)
E-Visit for Upper Respiratory Infection   We are sorry you are not feeling well.  Here is how we plan to help!  Based on what you have shared with me, it looks like you may have a bacterial upper respiratory infection.    Symptoms vary from person to person, with common symptoms including sore throat, cough, fatigue or lack of energy and feeling of general discomfort.  A low-grade fever of up to 100.4 may present, but is often uncommon.  Symptoms vary however, and are closely related to a person's age or underlying illnesses.  The most common symptoms associated with an upper respiratory infection are nasal discharge or congestion, cough, sneezing, headache and pressure in the ears and face.  These symptoms usually persist for about 3 to 10 days, but can last up to 2 weeks.  It is important to know that upper respiratory infections do not cause serious illness or complications in most cases.    Upper respiratory infections can be transmitted from person to person, with the most common method of transmission being a person's hands.  Also, these can be transmitted when someone coughs or sneezes; thus, it is important to cover the mouth to reduce this risk.  To keep the spread of the illness at bay, good hand hygiene is very important.  I am prescribing Doxycycline 100mg  Take 1 tablet twice daily for 10 days.  For nasal congestion, you may use an oral decongestants such as Mucinex D or if you have glaucoma or high blood pressure use plain Mucinex.  Saline nasal spray or nasal drops can help and can safely be used as often as needed for congestion.  For your congestion, I have prescribed Fluticasone nasal spray one spray in each nostril twice a day  If you do not have a history of heart disease, hypertension, diabetes or thyroid disease, prostate/bladder issues or glaucoma, you may also use Sudafed to treat nasal congestion.  It is highly recommended that you consult with a pharmacist or your primary care  physician to ensure this medication is safe for you to take.     If you have a cough, you may use cough suppressants such as Delsym and Robitussin.  If you have glaucoma or high blood pressure, you can also use Coricidin HBP.   For cough I have prescribed for you A prescription cough medication called Tessalon Perles 100 mg. You may take 1-2 capsules every 8 hours as needed for cough  If you have a sore or scratchy throat, use a saltwater gargle-  to  teaspoon of salt dissolved in a 4-ounce to 8-ounce glass of warm water.  Gargle the solution for approximately 15-30 seconds and then spit.  It is important not to swallow the solution.  You can also use throat lozenges/cough drops and Chloraseptic spray to help with throat pain or discomfort.  Warm or cold liquids can also be helpful in relieving throat pain.  For headache, pain or general discomfort, you can use Ibuprofen or Tylenol as directed.   Some authorities believe that zinc sprays or the use of Echinacea may shorten the course of your symptoms.   HOME CARE Only take medications as instructed by your medical team. Be sure to drink plenty of fluids. Water is fine as well as fruit juices, sodas and electrolyte beverages. You may want to stay away from caffeine or alcohol. If you are nauseated, try taking small sips of liquids. How do you know if you are getting enough fluid?  Your urine should be a pale yellow or almost colorless. Get rest. Taking a steamy shower or using a humidifier may help nasal congestion and ease sore throat pain. You can place a towel over your head and breathe in the steam from hot water coming from a faucet. Using a saline nasal spray works much the same way. Cough drops, hard candies and sore throat lozenges may ease your cough. Avoid close contacts especially the very young and the elderly Cover your mouth if you cough or sneeze Always remember to wash your hands.   GET HELP RIGHT AWAY IF: You develop worsening  fever. If your symptoms do not improve within 10 days You develop yellow or green discharge from your nose over 3 days. You have coughing fits You develop a severe head ache or visual changes. You develop shortness of breath, difficulty breathing or start having chest pain Your symptoms persist after you have completed your treatment plan  MAKE SURE YOU  Understand these instructions. Will watch your condition. Will get help right away if you are not doing well or get worse.  Thank you for choosing an e-visit.  Your e-visit answers were reviewed by a board certified advanced clinical practitioner to complete your personal care plan. Depending upon the condition, your plan could have included both over the counter or prescription medications.  Please review your pharmacy choice. Make sure the pharmacy is open so you can pick up prescription now. If there is a problem, you may contact your provider through [employer] and have the prescription routed to another pharmacy.  Your safety is important to [place]. If you have drug allergies check your prescription carefully.   For the next 24 hours you can use MyChart to ask questions about today's visit, request a non-urgent call back, or ask for a work or school excuse. You will get an email in the next two days asking about your experience. I hope that your e-visit has been valuable and will speed your recovery.    I have spent 5 minutes in review of e-visit questionnaire, review and updating patient chart, medical decision making and response to patient.   Margaretann Loveless, PA-C

## 2023-03-18 ENCOUNTER — Telehealth: Payer: Self-pay

## 2023-03-18 NOTE — Telephone Encounter (Signed)
Pt called and states that she has had a cold and had an Evisit with an urgent care on 03/14/2023. Pt states the cold sx are better but now her skin is "hurting." Pt states it hurts to touch. No fever or other sx. Pt asks if there is anything else she can do? Thank you.

## 2023-03-22 ENCOUNTER — Encounter: Payer: Self-pay | Admitting: Family Medicine

## 2023-03-24 ENCOUNTER — Encounter: Payer: Self-pay | Admitting: Family Medicine

## 2023-03-24 ENCOUNTER — Ambulatory Visit: Admitting: Family Medicine

## 2023-03-24 VITALS — BP 132/82 | HR 78 | Temp 98.4°F | Ht 65.0 in | Wt 161.4 lb

## 2023-03-24 DIAGNOSIS — R52 Pain, unspecified: Secondary | ICD-10-CM | POA: Diagnosis not present

## 2023-03-24 LAB — COMPLETE METABOLIC PANEL WITH GFR
AST: 15 U/L (ref 10–35)
Albumin: 4.6 g/dL (ref 3.6–5.1)
Alkaline phosphatase (APISO): 65 U/L (ref 31–125)
Calcium: 9.7 mg/dL (ref 8.6–10.2)
Glucose, Bld: 94 mg/dL (ref 65–99)
Total Protein: 7.2 g/dL (ref 6.1–8.1)

## 2023-03-24 LAB — CBC WITH DIFFERENTIAL/PLATELET
Absolute Monocytes: 462 cells/uL (ref 200–950)
Basophils Relative: 0.3 %
Eosinophils Absolute: 43 cells/uL (ref 15–500)
Hemoglobin: 13.1 g/dL (ref 11.7–15.5)
MCH: 30.3 pg (ref 27.0–33.0)
MCV: 90.3 fL (ref 80.0–100.0)
Monocytes Relative: 6.5 %
WBC: 7.1 10*3/uL (ref 3.8–10.8)

## 2023-03-24 LAB — SEDIMENTATION RATE: Sed Rate: 103 mm/h — ABNORMAL HIGH (ref 0–20)

## 2023-03-24 MED ORDER — PREDNISONE 20 MG PO TABS
ORAL_TABLET | ORAL | 0 refills | Status: DC
Start: 2023-03-24 — End: 2023-04-25

## 2023-03-24 MED ORDER — PREGABALIN 75 MG PO CAPS: 75.0000 mg | ORAL_CAPSULE | Freq: Two times a day (BID) | ORAL | 0 refills | Status: DC

## 2023-03-24 MED ORDER — METHYLPREDNISOLONE ACETATE 80 MG/ML IJ SUSP
60.0000 mg | Freq: Once | INTRAMUSCULAR | Status: AC
Start: 2023-03-24 — End: 2023-03-24
  Administered 2023-03-24: 60 mg via INTRAMUSCULAR

## 2023-03-24 NOTE — Progress Notes (Signed)
Subjective:    Patient ID: Tracey Malone, female    DOB: May 31, 1976, 47 y.o.   MRN: [id number]  Patient recently had a virus about 2 weeks ago.  The symptoms of the virus have subsided.  However she now has diffuse body pain.  She reports hyperesthesias on the dorsal surface of both forearms.  She reports hyperesthesias on the anterior surface of both thighs.  She reports aching pain in her neck and down her back all over her skin.  She states that cold air makes the pain worse causes her to even cry.  Heat makes the pain feel better.  She denies any rash.  Denies fever or chills.  There is no swelling in her tongue or lips.  Pain is burning and intense.  She has normal strength 5/5 equal and symmetric in both arms and legs.  She has normal reflexes and normal sensation. Past Medical History:  Diagnosis Date   ADD (attention deficit disorder)    Bell's palsy    Migraines    Past Surgical History:  Procedure Laterality Date   HEMORRHOID SURGERY     INCONTINENCE SURGERY     TUBAL LIGATION     Current Outpatient Medications on File Prior to Visit  Medication Sig Dispense Refill   ALPRAZolam (XANAX) 0.5 MG tablet TAKE 1 TABLET(0.5 MG) BY MOUTH THREE TIMES DAILY AS NEEDED FOR ANXIETY 20 tablet 0   benzonatate (TESSALON) 100 MG capsule Take 1 capsule (100 mg total) by mouth 3 (three) times daily as needed. 30 capsule 0   cyanocobalamin (,VITAMIN B-12,) 1000 MCG/ML injection      doxycycline (VIBRA-TABS) 100 MG tablet Take 1 tablet (100 mg total) by mouth 2 (two) times daily. 20 tablet 0   escitalopram (LEXAPRO) 10 MG tablet Take 1 tablet (10 mg total) by mouth daily. 30 tablet 5   fluticasone (FLONASE) 50 MCG/ACT nasal spray Place 2 sprays into both nostrils daily. 16 g 0   methylphenidate (RITALIN LA) 10 MG 24 hr capsule Take 1 capsule (10 mg total) by mouth daily. 30 capsule 0   pantoprazole (PROTONIX) 40 MG tablet TAKE 1 TABLET(40 MG) BY MOUTH DAILY 30 tablet 3   VITAMIN D PO      No  current facility-administered medications on file prior to visit.   Allergies  Allergen Reactions   Codeine Shortness Of Breath   Topamax [Topiramate]     Cognitive Slowing--types same word over and over. Cannot get out correct words when talking.   Treximet [Sumatriptan-Naproxen Sodium] Shortness Of Breath and Swelling    Throat gets tight.   Penicillins Swelling    Swelling unknown  Has patient had a PCN reaction causing immediate rash, facial/tongue/throat swelling, SOB or lightheadedness with hypotension: Unknown Has patient had a PCN reaction causing severe rash involving mucus membranes or skin necrosis: No  Has patient had a PCN reaction that required hospitalization: No  Has patient had a PCN reaction occurring within the last 10 years: No  If all of the above answers are "NO", then may proceed with Cep   Social History   Socioeconomic History   Marital status: Married    Spouse name: Not on file   Number of children: Not on file   Years of education: Not on file   Highest education level: Not on file  Occupational History   Not on file  Tobacco Use   Smoking status: Never   Smokeless tobacco: Never  Substance and Sexual Activity  Alcohol use: No    Comment: ocasional   Drug use: No   Sexual activity: Yes    Birth control/protection: Surgical    Comment: Married to [employer], two kids.  Retired from [employer].  Other Topics Concern   Not on file  Social History Narrative   Not on file   Social Determinants of Health   Financial Resource Strain: Not on file  Food Insecurity: Not on file  Transportation Needs: Not on file  Physical Activity: Not on file  Stress: Not on file  Social Connections: Not on file  Intimate Partner Violence: Not on file   Family History  Problem Relation Age of Onset   COPD Father       Review of Systems  All other systems reviewed and are negative.      Objective:   Physical Exam Vitals reviewed.  Constitutional:       General: She is not in acute distress.    Appearance: She is well-developed. She is not diaphoretic.  HENT:     Head: Normocephalic and atraumatic.     Right Ear: External ear normal.     Left Ear: External ear normal.     Nose: Nose normal.     Mouth/Throat:     Pharynx: No oropharyngeal exudate.  Eyes:     General: No scleral icterus.       Right eye: No discharge.        Left eye: No discharge.     Conjunctiva/sclera: Conjunctivae normal.     Pupils: Pupils are equal, round, and reactive to light.  Neck:     Thyroid: No thyromegaly.     Vascular: No JVD.     Trachea: No tracheal deviation.  Cardiovascular:     Rate and Rhythm: Normal rate and regular rhythm.     Heart sounds: Normal heart sounds. No murmur heard.    No friction rub. No gallop.  Pulmonary:     Effort: Pulmonary effort is normal. No respiratory distress.     Breath sounds: Normal breath sounds. No wheezing or rales.  Chest:     Chest wall: No tenderness.  Abdominal:     General: Bowel sounds are normal. There is no distension.     Palpations: Abdomen is soft. There is no mass.     Tenderness: There is no abdominal tenderness. There is no guarding or rebound.  Musculoskeletal:        General: No tenderness. Normal range of motion.     Cervical back: Normal range of motion and neck supple.  Lymphadenopathy:     Cervical: No cervical adenopathy.  Skin:    General: Skin is warm.     Coloration: Skin is not pale.     Findings: No erythema or rash.  Neurological:     Mental Status: She is alert and oriented to person, place, and time.     Cranial Nerves: No cranial nerve deficit.     Motor: No abnormal muscle tone.     Coordination: Coordination normal.     Deep Tendon Reflexes: Reflexes are normal and symmetric.  Psychiatric:        Behavior: Behavior normal.        Thought Content: Thought content normal.        Judgment: Judgment normal.           Assessment & Plan:  Diffuse pain - Plan: CBC  with Differential/Platelet, COMPLETE METABOLIC PANEL WITH GFR, Sedimentation rate, C-reactive protein, methylPREDNISolone acetate (  DEPO-MEDROL) injection 60 mg Pain does not follow any typical anatomical pattern.  Sounds almost neuropathic.  However there is no evidence of any neuropathy on exam.  Patient did recently have a virus but there is no evidence of mild right peripheral neuropathy based on her strength and sensation.  She only lesions.  She also has muscle aches.  I will try the patient on a prednisone taper pack.  If the pain is beyond control, she can also use Lyrica 75 mg twice daily as needed.  Recheck in 1 week if no better or sooner if worsening check CBC CMP sedimentation rate and CRP

## 2023-03-25 ENCOUNTER — Ambulatory Visit: Admitting: Family Medicine

## 2023-03-25 LAB — CBC WITH DIFFERENTIAL/PLATELET
Basophils Absolute: 21 cells/uL (ref 0–200)
Eosinophils Relative: 0.6 %
HCT: 39 % (ref 35.0–45.0)
Lymphs Abs: 2180 cells/uL (ref 850–3900)
MCHC: 33.6 g/dL (ref 32.0–36.0)
MPV: 9.4 fL (ref 7.5–12.5)
Neutro Abs: 4395 cells/uL (ref 1500–7800)
Neutrophils Relative %: 61.9 %
Platelets: 409 10*3/uL — ABNORMAL HIGH (ref 140–400)
RBC: 4.32 10*6/uL (ref 3.80–5.10)
RDW: 12.5 % (ref 11.0–15.0)
Total Lymphocyte: 30.7 %

## 2023-03-25 LAB — COMPLETE METABOLIC PANEL WITH GFR
AG Ratio: 1.8 (calc) (ref 1.0–2.5)
ALT: 22 U/L (ref 6–29)
BUN: 13 mg/dL (ref 7–25)
CO2: 27 mmol/L (ref 20–32)
Chloride: 103 mmol/L (ref 98–110)
Creat: 0.79 mg/dL (ref 0.50–0.99)
Globulin: 2.6 g/dL (calc) (ref 1.9–3.7)
Potassium: 4.6 mmol/L (ref 3.5–5.3)
Sodium: 139 mmol/L (ref 135–146)
Total Bilirubin: 0.3 mg/dL (ref 0.2–1.2)
eGFR: 92 mL/min/{1.73_m2} (ref >=60)

## 2023-03-25 LAB — C-REACTIVE PROTEIN: CRP: <3 mg/L (ref <8.0)

## 2023-03-29 ENCOUNTER — Other Ambulatory Visit: Payer: Self-pay | Admitting: Family Medicine

## 2023-03-29 MED ORDER — HYDROCODONE-ACETAMINOPHEN 5-325 MG PO TABS: 1.0000 | ORAL_TABLET | Freq: Four times a day (QID) | ORAL | 0 refills | Status: DC | PRN

## 2023-04-04 ENCOUNTER — Ambulatory Visit: Admitting: Family Medicine

## 2023-04-04 ENCOUNTER — Other Ambulatory Visit: Payer: Self-pay | Admitting: Family Medicine

## 2023-04-04 VITALS — BP 122/76 | HR 79 | Temp 97.8°F | Wt 167.0 lb

## 2023-04-04 DIAGNOSIS — M2550 Pain in unspecified joint: Secondary | ICD-10-CM | POA: Diagnosis not present

## 2023-04-04 NOTE — Progress Notes (Signed)
Subjective:    Patient ID: Tracey Malone, female    DOB: April 13, 1976, 47 y.o.   MRN: [id number] 03/24/23 Patient recently had a virus about 2 weeks ago.  The symptoms of the virus have subsided.  However she now has diffuse body pain.  She reports hyperesthesias on the dorsal surface of both forearms.  She reports hyperesthesias on the anterior surface of both thighs.  She reports aching pain in her neck and down her back all over her skin.  She states that cold air makes the pain worse causes her to even cry.  Heat makes the pain feel better.  She denies any rash.  Denies fever or chills.  There is no swelling in her tongue or lips.  Pain is burning and intense.  She has normal strength 5/5 equal and symmetric in both arms and legs.  She has normal reflexes and normal sensation.  At that time, my plan was: Pain does not follow any typical anatomical pattern.  Sounds almost neuropathic.  However there is no evidence of any neuropathy on exam.  Patient did recently have a virus but there is no evidence of mild right peripheral neuropathy based on her strength and sensation.  She only lesions.  She also has muscle aches.  I will try the patient on a prednisone taper pack.  If the pain is beyond control, she can also use Lyrica 75 mg twice daily as needed.  Recheck in 1 week if no better or sooner if worsening check CBC CMP sedimentation rate and CRP  04/04/23 Labs were normal last time except for a sed rate of 103.  The patient's polyarthralgias and diffuse muscle pain have moved.  She states that the pains in her legs have improved however now the pain is centered from the base of her skull down to her tailbone.  She states that the entire torso hurts.  Is not just limited to the spine.  It has pain diffusely across the back from the neck all the way down to the tailbone encompassing the ribs and the paraspinal muscles.  She has no weakness or numbness in her legs.  She has no weakness or numbness in her  arms.  She denies any fevers or chills.  There is no rash. Past Medical History:  Diagnosis Date   ADD (attention deficit disorder)    Bell's palsy    Migraines    Past Surgical History:  Procedure Laterality Date   HEMORRHOID SURGERY     INCONTINENCE SURGERY     TUBAL LIGATION     Current Outpatient Medications on File Prior to Visit  Medication Sig Dispense Refill   ALPRAZolam (XANAX) 0.5 MG tablet TAKE 1 TABLET(0.5 MG) BY MOUTH THREE TIMES DAILY AS NEEDED FOR ANXIETY 20 tablet 0   benzonatate (TESSALON) 100 MG capsule Take 1 capsule (100 mg total) by mouth 3 (three) times daily as needed. 30 capsule 0   cyanocobalamin (,VITAMIN B-12,) 1000 MCG/ML injection      doxycycline (VIBRA-TABS) 100 MG tablet Take 1 tablet (100 mg total) by mouth 2 (two) times daily. 20 tablet 0   escitalopram (LEXAPRO) 10 MG tablet Take 1 tablet (10 mg total) by mouth daily. 30 tablet 5   fluticasone (FLONASE) 50 MCG/ACT nasal spray Place 2 sprays into both nostrils daily. 16 g 0   HYDROcodone-acetaminophen (NORCO) 5-325 MG tablet Take 1 tablet by mouth every 6 (six) hours as needed for moderate pain. 30 tablet 0   methylphenidate (  RITALIN LA) 10 MG 24 hr capsule Take 1 capsule (10 mg total) by mouth daily. 30 capsule 0   pantoprazole (PROTONIX) 40 MG tablet TAKE 1 TABLET(40 MG) BY MOUTH DAILY 30 tablet 3   predniSONE (DELTASONE) 20 MG tablet 3 tabs poqday 1-2, 2 tabs poqday 3-4, 1 tab poqday 5-6 12 tablet 0   pregabalin (LYRICA) 75 MG capsule Take 1 capsule (75 mg total) by mouth 2 (two) times daily. 60 capsule 0   VITAMIN D PO      No current facility-administered medications on file prior to visit.   Allergies  Allergen Reactions   Codeine Shortness Of Breath   Topamax [Topiramate]     Cognitive Slowing--types same word over and over. Cannot get out correct words when talking.   Treximet [Sumatriptan-Naproxen Sodium] Shortness Of Breath and Swelling    Throat gets tight.   Penicillins Swelling     Swelling unknown  Has patient had a PCN reaction causing immediate rash, facial/tongue/throat swelling, SOB or lightheadedness with hypotension: Unknown Has patient had a PCN reaction causing severe rash involving mucus membranes or skin necrosis: No  Has patient had a PCN reaction that required hospitalization: No  Has patient had a PCN reaction occurring within the last 10 years: No  If all of the above answers are "NO", then may proceed with Cep   Social History   Socioeconomic History   Marital status: Married    Spouse name: Not on file   Number of children: Not on file   Years of education: Not on file   Highest education level: Not on file  Occupational History   Not on file  Tobacco Use   Smoking status: Never   Smokeless tobacco: Never  Substance and Sexual Activity   Alcohol use: No    Comment: ocasional   Drug use: No   Sexual activity: Yes    Birth control/protection: Surgical    Comment: Married to [employer], two kids.  Retired from [employer].  Other Topics Concern   Not on file  Social History Narrative   Not on file   Social Determinants of Health   Financial Resource Strain: Not on file  Food Insecurity: Not on file  Transportation Needs: Not on file  Physical Activity: Not on file  Stress: Not on file  Social Connections: Not on file  Intimate Partner Violence: Not on file   Family History  Problem Relation Age of Onset   COPD Father       Review of Systems  All other systems reviewed and are negative.      Objective:   Physical Exam Vitals reviewed.  Constitutional:      General: She is not in acute distress.    Appearance: She is well-developed. She is not diaphoretic.  HENT:     Head: Normocephalic and atraumatic.     Right Ear: External ear normal.     Left Ear: External ear normal.     Nose: Nose normal.     Mouth/Throat:     Pharynx: No oropharyngeal exudate.  Eyes:     General: No scleral icterus.       Right eye: No discharge.         Left eye: No discharge.     Conjunctiva/sclera: Conjunctivae normal.     Pupils: Pupils are equal, round, and reactive to light.  Neck:     Thyroid: No thyromegaly.     Vascular: No JVD.     Trachea: No  tracheal deviation.  Cardiovascular:     Rate and Rhythm: Normal rate and regular rhythm.     Heart sounds: Normal heart sounds. No murmur heard.    No friction rub. No gallop.  Pulmonary:     Effort: Pulmonary effort is normal. No respiratory distress.     Breath sounds: Normal breath sounds. No wheezing or rales.  Chest:     Chest wall: No tenderness.  Abdominal:     General: Bowel sounds are normal. There is no distension.     Palpations: Abdomen is soft. There is no mass.     Tenderness: There is no abdominal tenderness. There is no guarding or rebound.  Musculoskeletal:        General: No tenderness. Normal range of motion.     Cervical back: Normal range of motion and neck supple.       Back:  Lymphadenopathy:     Cervical: No cervical adenopathy.  Skin:    General: Skin is warm.     Coloration: Skin is not pale.     Findings: No erythema or rash.  Neurological:     Mental Status: She is alert and oriented to person, place, and time.     Cranial Nerves: No cranial nerve deficit.     Motor: No abnormal muscle tone.     Coordination: Coordination normal.     Deep Tendon Reflexes: Reflexes are normal and symmetric.  Psychiatric:        Behavior: Behavior normal.        Thought Content: Thought content normal.        Judgment: Judgment normal.           Assessment & Plan:  Polyarthralgia - Plan: Sedimentation rate, ANA, Rheumatoid factor, [place] mtn spotted fvr abs pnl(IgG+IgM), B. burgdorfi antibodies by WB, CK, DG Thoracic Spine W/Swimmers, DG Lumbar Spine Complete Patient complains of pain in the entire area circled in red.  I will repeat lab work today including a CBC to evaluate for any evidence of infection with leukocytosis or issues with her bone  marrow.,  Check a CMP to evaluate for any evidence of organ dysfunction.  However I am more concerned about autoimmune diseases at this point versus skeletal lesions.  Therefore we will start by obtaining x-rays of the thoracic and lumbar spine.  Will check an ANA, sed rate, rheumatoid factor.  Will also check tickborne titers to look for Lyme disease.

## 2023-04-05 LAB — CK: Total CK: 56 U/L (ref 29–143)

## 2023-04-05 NOTE — Telephone Encounter (Signed)
Requested Prescriptions  Pending Prescriptions Disp Refills   escitalopram (LEXAPRO) 10 MG tablet [Pharmacy Med Name: ESCITALOPRAM 10MG  TABLETS] 90 tablet 0    Sig: TAKE 1 TABLET(10 MG) BY MOUTH DAILY     Psychiatry:  Antidepressants - SSRI Failed - 04/04/2023  3:14 AM      Failed - Valid encounter within last 6 months    Recent Outpatient Visits           2 years ago Lateral epicondylitis of left elbow   [facility] Donita Brooks, MD   2 years ago Cervical cancer screening   [facility] Donita Brooks, MD   2 years ago Lateral epicondylitis of left elbow   [facility] Donita Brooks, MD   3 years ago Acute cystitis with hematuria   [facility] [place], Velna Hatchet, MD   3 years ago Attention deficit hyperactivity disorder (ADHD), predominantly inattentive type   [facility] [place], Priscille Heidelberg, MD

## 2023-04-08 ENCOUNTER — Encounter: Payer: Self-pay | Admitting: Family Medicine

## 2023-04-08 LAB — ANA: Anti Nuclear Antibody (ANA): NEGATIVE

## 2023-04-11 ENCOUNTER — Ambulatory Visit: Admission: RE | Admit: 2023-04-11 | Source: Ambulatory Visit

## 2023-04-11 DIAGNOSIS — M2550 Pain in unspecified joint: Secondary | ICD-10-CM

## 2023-04-11 LAB — B. BURGDORFI ANTIBODIES BY WB

## 2023-04-11 LAB — ROCKY MTN SPOTTED FVR ABS PNL(IGG+IGM)
RMSF IgG: NOT DETECTED
RMSF IgM: NOT DETECTED

## 2023-04-11 LAB — SEDIMENTATION RATE: Sed Rate: 77 mm/h — ABNORMAL HIGH (ref 0–20)

## 2023-04-11 LAB — RHEUMATOID FACTOR: Rheumatoid fact SerPl-aCnc: <10 [IU]/mL (ref <14)

## 2023-04-13 ENCOUNTER — Telehealth: Payer: Self-pay

## 2023-04-13 ENCOUNTER — Encounter: Payer: Self-pay | Admitting: Family Medicine

## 2023-04-13 NOTE — Telephone Encounter (Signed)
My Chart message from patient:   Is there anything that may work better for the current pain? Lyrica helps but not long and doesn't give full relief. I'll be so glad when we figure this out!

## 2023-04-19 ENCOUNTER — Other Ambulatory Visit

## 2023-04-19 DIAGNOSIS — R52 Pain, unspecified: Secondary | ICD-10-CM

## 2023-04-19 DIAGNOSIS — M2550 Pain in unspecified joint: Secondary | ICD-10-CM

## 2023-04-21 ENCOUNTER — Other Ambulatory Visit: Payer: Self-pay

## 2023-04-21 ENCOUNTER — Other Ambulatory Visit: Payer: Self-pay | Admitting: Family Medicine

## 2023-04-21 DIAGNOSIS — M4640 Discitis, unspecified, site unspecified: Secondary | ICD-10-CM

## 2023-04-21 DIAGNOSIS — M5412 Radiculopathy, cervical region: Secondary | ICD-10-CM

## 2023-04-21 DIAGNOSIS — M2550 Pain in unspecified joint: Secondary | ICD-10-CM

## 2023-04-21 DIAGNOSIS — R52 Pain, unspecified: Secondary | ICD-10-CM

## 2023-04-21 DIAGNOSIS — R700 Elevated erythrocyte sedimentation rate: Secondary | ICD-10-CM

## 2023-04-21 LAB — CBC WITH DIFFERENTIAL/PLATELET
Absolute Monocytes: 454 cells/uL (ref 200–950)
Basophils Absolute: 21 cells/uL (ref 0–200)
Basophils Relative: 0.3 %
Eosinophils Absolute: 50 cells/uL (ref 15–500)
Eosinophils Relative: 0.7 %
HCT: 35.4 % (ref 35.0–45.0)
Hemoglobin: 11.7 g/dL (ref 11.7–15.5)
Lymphs Abs: 1285 cells/uL (ref 850–3900)
MCH: 30.5 pg (ref 27.0–33.0)
MCHC: 33.1 g/dL (ref 32.0–36.0)
MCV: 92.2 fL (ref 80.0–100.0)
MPV: 9.6 fL (ref 7.5–12.5)
Monocytes Relative: 6.4 %
Neutro Abs: 5290 cells/uL (ref 1500–7800)
Neutrophils Relative %: 74.5 %
Platelets: 286 10*3/uL (ref 140–400)
RBC: 3.84 10*6/uL (ref 3.80–5.10)
RDW: 13.4 % (ref 11.0–15.0)
Total Lymphocyte: 18.1 %
WBC: 7.1 10*3/uL (ref 3.8–10.8)

## 2023-04-21 LAB — COMPLETE METABOLIC PANEL WITH GFR
AG Ratio: 1.8 (calc) (ref 1.0–2.5)
ALT: 20 U/L (ref 6–29)
AST: 16 U/L (ref 10–35)
Albumin: 4.2 g/dL (ref 3.6–5.1)
Alkaline phosphatase (APISO): 66 U/L (ref 31–125)
BUN: 14 mg/dL (ref 7–25)
CO2: 27 mmol/L (ref 20–32)
Calcium: 8.9 mg/dL (ref 8.6–10.2)
Chloride: 105 mmol/L (ref 98–110)
Creat: 0.65 mg/dL (ref 0.50–0.99)
Globulin: 2.3 g/dL (calc) (ref 1.9–3.7)
Glucose, Bld: 75 mg/dL (ref 65–99)
Potassium: 4 mmol/L (ref 3.5–5.3)
Sodium: 139 mmol/L (ref 135–146)
Total Bilirubin: 0.4 mg/dL (ref 0.2–1.2)
Total Protein: 6.5 g/dL (ref 6.1–8.1)
eGFR: 109 mL/min/{1.73_m2} (ref >=60)

## 2023-04-21 LAB — PROTEIN ELECTROPHORESIS, SERUM, WITH REFLEX
Albumin ELP: 3.8 g/dL (ref 3.8–4.8)
Alpha 1: 0.3 g/dL (ref 0.2–0.3)
Alpha 2: 0.7 g/dL (ref 0.5–0.9)
Beta 2: 0.2 g/dL (ref 0.2–0.5)
Beta Globulin: 0.4 g/dL (ref 0.4–0.6)
Gamma Globulin: 0.8 g/dL (ref 0.8–1.7)
Total Protein: 6.2 g/dL (ref 6.1–8.1)

## 2023-04-21 LAB — SEDIMENTATION RATE: Sed Rate: 79 mm/h — ABNORMAL HIGH (ref 0–20)

## 2023-04-22 ENCOUNTER — Ambulatory Visit
Admission: RE | Admit: 2023-04-22 | Discharge: 2023-04-22 | Disposition: A | Discharge status: Home / Self Care | Source: Ambulatory Visit | Attending: Family Medicine | Admitting: Family Medicine

## 2023-04-22 ENCOUNTER — Telehealth: Payer: Self-pay

## 2023-04-22 DIAGNOSIS — M4640 Discitis, unspecified, site unspecified: Secondary | ICD-10-CM

## 2023-04-22 DIAGNOSIS — R700 Elevated erythrocyte sedimentation rate: Secondary | ICD-10-CM

## 2023-04-22 DIAGNOSIS — M5412 Radiculopathy, cervical region: Secondary | ICD-10-CM

## 2023-04-22 NOTE — Telephone Encounter (Signed)
New order for CT without contrast needed. Order entered per Dr. Caren Macadam order. Mjp,lpn

## 2023-04-25 ENCOUNTER — Other Ambulatory Visit: Payer: Self-pay | Admitting: Family Medicine

## 2023-04-25 MED ORDER — HYDROCODONE-ACETAMINOPHEN 5-325 MG PO TABS: 1.0000 | ORAL_TABLET | Freq: Four times a day (QID) | ORAL | 0 refills | Status: DC | PRN

## 2023-04-25 MED ORDER — PREDNISONE 20 MG PO TABS
20.0000 mg | ORAL_TABLET | Freq: Every day | ORAL | 0 refills | Status: DC
Start: 2023-04-25 — End: 2023-05-11

## 2023-04-25 NOTE — Telephone Encounter (Signed)
Patient called to follow up; stated rheumatologist on referral she received isn't able to see her until September. Patient stated she isn't handling the pain very well and can't wait until then. Requesting for referral to be marked stat and for a sooner appointment.   Please advise at [phone].

## 2023-04-29 ENCOUNTER — Ambulatory Visit: Admitting: Family Medicine

## 2023-04-29 ENCOUNTER — Other Ambulatory Visit: Payer: Self-pay

## 2023-04-29 ENCOUNTER — Encounter: Payer: Self-pay | Admitting: Family Medicine

## 2023-04-29 ENCOUNTER — Telehealth: Payer: Self-pay

## 2023-04-29 VITALS — BP 118/62 | HR 86 | Temp 97.7°F | Ht 65.0 in | Wt 160.6 lb

## 2023-04-29 DIAGNOSIS — R203 Hyperesthesia: Secondary | ICD-10-CM

## 2023-04-29 DIAGNOSIS — G629 Polyneuropathy, unspecified: Secondary | ICD-10-CM | POA: Diagnosis not present

## 2023-04-29 DIAGNOSIS — R700 Elevated erythrocyte sedimentation rate: Secondary | ICD-10-CM

## 2023-04-29 DIAGNOSIS — M5412 Radiculopathy, cervical region: Secondary | ICD-10-CM

## 2023-04-29 LAB — SEDIMENTATION RATE: Sed Rate: 106 mm/h — ABNORMAL HIGH (ref 0–20)

## 2023-04-29 MED ORDER — PREGABALIN 150 MG PO CAPS: 150.0000 mg | ORAL_CAPSULE | Freq: Two times a day (BID) | ORAL | 1 refills | Status: DC

## 2023-04-29 NOTE — Progress Notes (Signed)
Subjective:    Patient ID: Tracey Malone, female    DOB: March 10, 1976, 47 y.o.   MRN: [id number] 03/24/23 Patient recently had a virus about 2 weeks ago.  The symptoms of the virus have subsided.  However she now has diffuse body pain.  She reports hyperesthesias on the dorsal surface of both forearms.  She reports hyperesthesias on the anterior surface of both thighs.  She reports aching pain in her neck and down her back all over her skin.  She states that cold air makes the pain worse causes her to even cry.  Heat makes the pain feel better.  She denies any rash.  Denies fever or chills.  There is no swelling in her tongue or lips.  Pain is burning and intense.  She has normal strength 5/5 equal and symmetric in both arms and legs.  She has normal reflexes and normal sensation.  At that time, my plan was: Pain does not follow any typical anatomical pattern.  Sounds almost neuropathic.  However there is no evidence of any neuropathy on exam.  Patient did recently have a virus but there is no evidence of mild right peripheral neuropathy based on her strength and sensation.  She only lesions.  She also has muscle aches.  I will try the patient on a prednisone taper pack.  If the pain is beyond control, she can also use Lyrica 75 mg twice daily as needed.  Recheck in 1 week if no better or sooner if worsening check CBC CMP sedimentation rate and CRP  04/04/23 Labs were normal last time except for a sed rate of 103.  The patient's polyarthralgias and diffuse muscle pain have moved.  She states that the pains in her legs have improved however now the pain is centered from the base of her skull down to her tailbone.  She states that the entire torso hurts.  Is not just limited to the spine.  It has pain diffusely across the back from the neck all the way down to the tailbone encompassing the ribs and the paraspinal muscles.  She has no weakness or numbness in her legs.  She has no weakness or numbness in her  arms.  She denies any fevers or chills.  There is no rash.  At that time, my plan was: Patient complains of pain in the entire area circled in red.  I will repeat lab work today including a CBC to evaluate for any evidence of infection with leukocytosis or issues with her bone marrow.,  Check a CMP to evaluate for any evidence of organ dysfunction.  However I am more concerned about autoimmune diseases at this point versus skeletal lesions.  Therefore we will start by obtaining x-rays of the thoracic and lumbar spine.  Will check an ANA, sed rate, rheumatoid factor.  Will also check tickborne titers to look for Lyme disease.  04/29/23 Patient presents for diagnostic dilemma.  ANA and rheumatoid factor were negative.  SPEP was negative.  X-rays of the thoracic and lumbar spine were negative.  Given the elevated sedimentation rate and the patient's reporting of severe pain in her neck burning stinging paresthesias radiating down both arms, obtain that CT scan of neck to evaluate for potential source for nerve impingement such as epidural abscess.  CT scan of the neck was unremarkable.  Lyme titer and RMSF titers were negative.  CBC and CMP have been normal.  However the patient has had a persistently elevated sedimentation rate.  Initial sedimentation rate was greater than 100.  Follow-up sedimentation rate was in the 70s.  Patient continues to report severe pain in her neck and burning pain like her skin is on fire radiating down both arms.  She also reports burning paresthesias radiating down the center of her back and into her ribs to her waist.  She denies any nerve symptoms in her legs.  She denies any chest pain or pleurisy or hemoptysis.  She denies any nausea or vomiting or diarrhea.  She denies any melena or hematochezia or vaginal bleeding. Past Medical History:  Diagnosis Date   ADD (attention deficit disorder)    Bell's palsy    Migraines    Past Surgical History:  Procedure Laterality Date    HEMORRHOID SURGERY     INCONTINENCE SURGERY     TUBAL LIGATION     Current Outpatient Medications on File Prior to Visit  Medication Sig Dispense Refill   ALPRAZolam (XANAX) 0.5 MG tablet TAKE 1 TABLET(0.5 MG) BY MOUTH THREE TIMES DAILY AS NEEDED FOR ANXIETY 20 tablet 0   benzonatate (TESSALON) 100 MG capsule Take 1 capsule (100 mg total) by mouth 3 (three) times daily as needed. 30 capsule 0   cyanocobalamin (,VITAMIN B-12,) 1000 MCG/ML injection      doxycycline (VIBRA-TABS) 100 MG tablet Take 1 tablet (100 mg total) by mouth 2 (two) times daily. 20 tablet 0   escitalopram (LEXAPRO) 10 MG tablet TAKE 1 TABLET(10 MG) BY MOUTH DAILY 90 tablet 0   fluticasone (FLONASE) 50 MCG/ACT nasal spray Place 2 sprays into both nostrils daily. 16 g 0   HYDROcodone-acetaminophen (NORCO) 5-325 MG tablet Take 1 tablet by mouth every 6 (six) hours as needed for moderate pain. 30 tablet 0   methylphenidate (RITALIN LA) 10 MG 24 hr capsule Take 1 capsule (10 mg total) by mouth daily. 30 capsule 0   pantoprazole (PROTONIX) 40 MG tablet TAKE 1 TABLET(40 MG) BY MOUTH DAILY 30 tablet 3   predniSONE (DELTASONE) 20 MG tablet Take 1 tablet (20 mg total) by mouth daily with breakfast. 14 tablet 0   pregabalin (LYRICA) 75 MG capsule Take 1 capsule (75 mg total) by mouth 2 (two) times daily. 60 capsule 0   VITAMIN D PO      No current facility-administered medications on file prior to visit.   Allergies  Allergen Reactions   Codeine Shortness Of Breath   Topamax [Topiramate]     Cognitive Slowing--types same word over and over. Cannot get out correct words when talking.   Treximet [Sumatriptan-Naproxen Sodium] Shortness Of Breath and Swelling    Throat gets tight.   Penicillins Swelling    Swelling unknown  Has patient had a PCN reaction causing immediate rash, facial/tongue/throat swelling, SOB or lightheadedness with hypotension: Unknown Has patient had a PCN reaction causing severe rash involving mucus  membranes or skin necrosis: No  Has patient had a PCN reaction that required hospitalization: No  Has patient had a PCN reaction occurring within the last 10 years: No  If all of the above answers are "NO", then may proceed with Cep   Social History   Socioeconomic History   Marital status: Married    Spouse name: Not on file   Number of children: Not on file   Years of education: Not on file   Highest education level: Not on file  Occupational History   Not on file  Tobacco Use   Smoking status: Never   Smokeless tobacco: Never  Substance and Sexual Activity   Alcohol use: No    Comment: ocasional   Drug use: No   Sexual activity: Yes    Birth control/protection: Surgical    Comment: Married to Tracey Malone, two kids.  Retired from [employer].  Other Topics Concern   Not on file  Social History Narrative   Not on file   Social Determinants of Health   Financial Resource Strain: Not on file  Food Insecurity: Not on file  Transportation Needs: Not on file  Physical Activity: Not on file  Stress: Not on file  Social Connections: Not on file  Intimate Partner Violence: Not on file   Family History  Problem Relation Age of Onset   COPD Father       Review of Systems  All other systems reviewed and are negative.      Objective:   Physical Exam Vitals reviewed.  Constitutional:      General: She is not in acute distress.    Appearance: She is well-developed. She is not diaphoretic.  HENT:     Head: Normocephalic and atraumatic.     Right Ear: External ear normal.     Left Ear: External ear normal.     Nose: Nose normal.     Mouth/Throat:     Pharynx: No oropharyngeal exudate.  Eyes:     General: No scleral icterus.       Right eye: No discharge.        Left eye: No discharge.     Conjunctiva/sclera: Conjunctivae normal.     Pupils: Pupils are equal, round, and reactive to light.  Neck:     Thyroid: No thyromegaly.     Vascular: No JVD.     Trachea: No tracheal  deviation.  Cardiovascular:     Rate and Rhythm: Normal rate and regular rhythm.     Heart sounds: Normal heart sounds. No murmur heard.    No friction rub. No gallop.  Pulmonary:     Effort: Pulmonary effort is normal. No respiratory distress.     Breath sounds: Normal breath sounds. No wheezing or rales.  Chest:     Chest wall: No tenderness.  Abdominal:     General: Bowel sounds are normal. There is no distension.     Palpations: Abdomen is soft. There is no mass.     Tenderness: There is no abdominal tenderness. There is no guarding or rebound.  Musculoskeletal:        General: No tenderness. Normal range of motion.     Cervical back: Normal range of motion and neck supple.       Back:  Lymphadenopathy:     Cervical: No cervical adenopathy.  Skin:    General: Skin is warm.     Coloration: Skin is not pale.     Findings: No erythema or rash.  Neurological:     Mental Status: She is alert and oriented to person, place, and time.     Cranial Nerves: No cranial nerve deficit.     Motor: No abnormal muscle tone.     Coordination: Coordination normal.     Deep Tendon Reflexes: Reflexes are normal and symmetric.  Psychiatric:        Behavior: Behavior normal.        Thought Content: Thought content normal.        Judgment: Judgment normal.           Assessment & Plan:  Neuropathy - Plan: Ambulatory referral to  Neurology, Sedimentation rate, C-reactive protein  Hyperesthesia  Elevated sed rate Given the elevated sedimentation rate I was concerned about an autoimmune disease.  However her ANA and rheumatoid factor is negative as has cbc.  I tried the patient empirically on prednisone but the immunosuppressive drugs did not improve the pain at all.  The patient states that anti-inflammatories do not help narcotics do not help muscle relaxer do not help.  Here because any problems next day is the only gives her any relief in neck and arms and in her upper back.  At this  point, symptoms suggest fibromyalgia but I cannot explain the elevated sedimentation rate with this.  Therefore I will try to expedite rheumatology referral.  I will also schedule the patient to see neurology to perform nerve conduction studies of the upper extremities to evaluate for any potential cause of nerve pain and to assist in the workup of her hyperesthesias.  Meanwhile increase Lyrica to 150 mg twice daily.  I will hold off MRI of the thoracic spine given the fact she had normal x-rays of the thoracic and lumbar spine until after seeing neurology

## 2023-04-29 NOTE — Telephone Encounter (Signed)
Donita Brooks, MD  Darral Dash, LPN Please call Dr Fatima Sanger office with rheumatology and see if they have any earlier appointments for this patient than Sept.

## 2023-05-02 ENCOUNTER — Encounter: Payer: Self-pay | Admitting: Neurology

## 2023-05-02 ENCOUNTER — Other Ambulatory Visit: Payer: Self-pay

## 2023-05-02 DIAGNOSIS — R202 Paresthesia of skin: Secondary | ICD-10-CM

## 2023-05-03 ENCOUNTER — Other Ambulatory Visit: Payer: Self-pay | Admitting: Family Medicine

## 2023-05-03 ENCOUNTER — Other Ambulatory Visit: Payer: Self-pay

## 2023-05-03 DIAGNOSIS — R202 Paresthesia of skin: Secondary | ICD-10-CM

## 2023-05-03 DIAGNOSIS — R700 Elevated erythrocyte sedimentation rate: Secondary | ICD-10-CM

## 2023-05-03 DIAGNOSIS — G8929 Other chronic pain: Secondary | ICD-10-CM

## 2023-05-03 DIAGNOSIS — R0789 Other chest pain: Secondary | ICD-10-CM

## 2023-05-03 NOTE — Progress Notes (Signed)
Ct chest

## 2023-05-10 ENCOUNTER — Ambulatory Visit
Admission: RE | Admit: 2023-05-10 | Discharge: 2023-05-10 | Disposition: A | Discharge status: Home / Self Care | Source: Ambulatory Visit | Attending: Family Medicine | Admitting: Family Medicine

## 2023-05-10 DIAGNOSIS — R0789 Other chest pain: Secondary | ICD-10-CM

## 2023-05-10 DIAGNOSIS — R700 Elevated erythrocyte sedimentation rate: Secondary | ICD-10-CM

## 2023-05-10 DIAGNOSIS — G8929 Other chronic pain: Secondary | ICD-10-CM

## 2023-05-10 MED ORDER — IOPAMIDOL (ISOVUE-300) INJECTION 61%
75.0000 mL | Freq: Once | INTRAVENOUS | Status: AC | PRN
  Administered 2023-05-10: 75 mL via INTRAVENOUS

## 2023-05-10 NOTE — Progress Notes (Unsigned)
Office Visit Note  Patient: Tracey Malone             Date of Birth: Dec 03, 1975           MRN: [id number]             PCP: Donita Brooks, MD Referring: Donita Brooks, MD Visit Date: 05/11/2023 Occupation: @GUAROCC @  Subjective:  No chief complaint on file.   History of Present Illness: Tracey Malone is a 47 y.o. female ***     Activities of Daily Living:  Patient reports morning stiffness for *** {minute/hour:19697}.   Patient {ACTIONS;DENIES/REPORTS:21021675::"Denies"} nocturnal pain.  Difficulty dressing/grooming: {ACTIONS;DENIES/REPORTS:21021675::"Denies"} Difficulty climbing stairs: {ACTIONS;DENIES/REPORTS:21021675::"Denies"} Difficulty getting out of chair: {ACTIONS;DENIES/REPORTS:21021675::"Denies"} Difficulty using hands for taps, buttons, cutlery, and/or writing: {ACTIONS;DENIES/REPORTS:21021675::"Denies"}  No Rheumatology ROS completed.   PMFS History:  Patient Active Problem List   Diagnosis Date Noted   Achilles tendinitis of right lower extremity 12/20/2022   Positive Tinel's sign 12/20/2022   Overweight (BMI 25.0-29.9) 05/27/2022   Plantar fasciitis, right 01/20/2022   Anxiety 10/27/2021   Fatty (change of) liver, not elsewhere classified 10/27/2021   Hot flashes 10/27/2021   Hyperlipidemia 10/27/2021   Leukocytosis 10/27/2021   Low libido 10/27/2021   Menopause 10/27/2021   Sleep disturbance 10/27/2021   Subclinical hypothyroidism 10/27/2021   Vaginal dryness 10/27/2021   Ganglion cyst of dorsum of right wrist 07/23/2021   Inflammatory pain 06/12/2021   Lateral epicondylitis, left elbow 02/26/2021   Pain of left forearm 02/25/2021   Injury of peroneal tendon of right foot 03/20/2018   Contracted, tendon sheath 10/06/2017   Chronic pain of right ankle 07/25/2017   Pain in right ankle and joints of right foot 02/14/2017   Tear of talofibular ligament of right lower extremity 02/14/2017   Bell's palsy    UTI (urinary tract infection)  06/27/2013   GERD (gastroesophageal reflux disease) 06/27/2013   ADD (attention deficit disorder) 06/27/2013   Migraines     Past Medical History:  Diagnosis Date   ADD (attention deficit disorder)    Bell's palsy    Migraines     Family History  Problem Relation Age of Onset   COPD Father    Past Surgical History:  Procedure Laterality Date   HEMORRHOID SURGERY     INCONTINENCE SURGERY     TUBAL LIGATION     Social History   Social History Narrative   Not on file   Immunization History  Administered Date(s) Administered   Influenza,inj,Quad PF,6+ Mos 06/27/2013, 08/12/2014   Tdap 08/09/2013     Objective: Vital Signs: LMP 03/14/2023 (Approximate)    Physical Exam   Musculoskeletal Exam: ***  CDAI Exam: CDAI Score: -- Patient Global: --; Provider Global: -- Swollen: --; Tender: -- Joint Exam 05/11/2023   No joint exam has been documented for this visit   There is currently no information documented on the homunculus. Go to the Rheumatology activity and complete the homunculus joint exam.  Investigation: No additional findings.  Imaging: CT CERVICAL SPINE WO CONTRAST  Result Date: 04/22/2023 CLINICAL DATA:  Radiculopathy. EXAM: CT CERVICAL SPINE WITHOUT CONTRAST TECHNIQUE: Multidetector CT imaging of the cervical spine was performed without intravenous contrast. Multiplanar CT image reconstructions were also generated. RADIATION DOSE REDUCTION: This exam was performed according to the departmental dose-optimization program which includes automated exposure control, adjustment of the mA and/or kV according to patient size and/or use of iterative reconstruction technique. COMPARISON:  None Available. FINDINGS: Alignment: Normal. Skull base and  vertebrae: No acute fracture. No primary bone lesion or focal pathologic process. Soft tissues and spinal canal: No prevertebral fluid or swelling. No visible canal hematoma. Disc levels: Mild degenerative change identified  at C5-6-7 with disc space narrowing and small marginal osteophytes. Upper chest: Negative. Other: None. IMPRESSION: Mild/early degenerative changes.  No acute osseous abnormalities. Electronically Signed   By: Layla Maw M.D.   On: 04/22/2023 17:12   DG Lumbar Spine Complete  Result Date: 04/15/2023 CLINICAL DATA:  Larey Seat 1 month ago.  Mid and LOWER back pain. EXAM: LUMBAR SPINE - COMPLETE 4+ VIEW COMPARISON:  11/02/2006 FINDINGS: There is no evidence of lumbar spine fracture. Alignment is normal. Intervertebral disc spaces are maintained. IMPRESSION: Negative. Electronically Signed   By: Norva Pavlov M.D.   On: 04/15/2023 12:47   DG Thoracic Spine W/Swimmers  Result Date: 04/15/2023 CLINICAL DATA:  Elevated sed rate.  Back pain. EXAM: THORACIC SPINE - 3 VIEWS COMPARISON:  08/15/2018 FINDINGS: There is normal alignment of the thoracic spine. There is no acute fracture or subluxation. No lytic or blastic lesions. Visualized portion of the chest and abdomen is unremarkable. IMPRESSION: Negative. Electronically Signed   By: Norva Pavlov M.D.   On: 04/15/2023 12:46    Recent Labs: Lab Results  Component Value Date   WBC 7.1 04/19/2023   HGB 11.7 04/19/2023   PLT 286 04/19/2023   NA 139 04/19/2023   K 4.0 04/19/2023   CL 105 04/19/2023   CO2 27 04/19/2023   GLUCOSE 75 04/19/2023   BUN 14 04/19/2023   CREATININE 0.65 04/19/2023   BILITOT 0.4 04/19/2023   ALKPHOS 71 03/30/2022   AST 16 04/19/2023   ALT 20 04/19/2023   PROT 6.2 04/19/2023   PROT 6.5 04/19/2023   ALBUMIN 4.4 03/30/2022   CALCIUM 8.9 04/19/2023   GFRAA >60 09/21/2018    Speciality Comments: No specialty comments available.  Procedures:  No procedures performed Allergies: Codeine, Topamax [topiramate], Treximet [sumatriptan-naproxen sodium], and Penicillins   Assessment / Plan:     Visit Diagnoses: Polyarthralgia - 04/04/23: Lyme disease-, B burgdorferi-, RMSF-, ESR 77, ANA negative, RF negative, CK  WNL  Elevated sed rate - 03/24/23: ESR 103, CRP WNL. 04/04/23: ESR 77. 04/19/23: ESR 79. 04/29/23: ESR 106, CRP WNL  Diffuse pain  Achilles tendinitis of right lower extremity  Lateral epicondylitis, left elbow  Plantar fasciitis, right  Tear of talofibular ligament of right lower extremity, subsequent encounter  Bell's palsy  Subclinical hypothyroidism  Fatty (change of) liver, not elsewhere classified  History of gastroesophageal reflux (GERD)  Hx of migraines  History of hyperlipidemia  History of anxiety  Attention deficit hyperactivity disorder (ADHD), predominantly inattentive type  Orders: No orders of the defined types were placed in this encounter.  No orders of the defined types were placed in this encounter.   Face-to-face time spent with patient was *** minutes. Greater than 50% of time was spent in counseling and coordination of care.  Follow-Up Instructions: No follow-ups on file.   Gearldine Bienenstock, PA-C  Note - This record has been created using Dragon software.  Chart creation errors have been sought, but may not always  have been located. Such creation errors do not reflect on  the standard of medical care.

## 2023-05-11 ENCOUNTER — Ambulatory Visit: Attending: Rheumatology | Admitting: Rheumatology

## 2023-05-11 ENCOUNTER — Encounter: Payer: Self-pay | Admitting: Rheumatology

## 2023-05-11 VITALS — BP 120/78 | HR 76 | Resp 16 | Ht 65.0 in | Wt 162.2 lb

## 2023-05-11 DIAGNOSIS — M722 Plantar fascial fibromatosis: Secondary | ICD-10-CM

## 2023-05-11 DIAGNOSIS — R5383 Other fatigue: Secondary | ICD-10-CM | POA: Diagnosis not present

## 2023-05-11 DIAGNOSIS — Z8719 Personal history of other diseases of the digestive system: Secondary | ICD-10-CM

## 2023-05-11 DIAGNOSIS — G510 Bell's palsy: Secondary | ICD-10-CM

## 2023-05-11 DIAGNOSIS — E559 Vitamin D deficiency, unspecified: Secondary | ICD-10-CM

## 2023-05-11 DIAGNOSIS — R700 Elevated erythrocyte sedimentation rate: Secondary | ICD-10-CM | POA: Diagnosis not present

## 2023-05-11 DIAGNOSIS — E038 Other specified hypothyroidism: Secondary | ICD-10-CM

## 2023-05-11 DIAGNOSIS — M2550 Pain in unspecified joint: Secondary | ICD-10-CM

## 2023-05-11 DIAGNOSIS — M5030 Other cervical disc degeneration, unspecified cervical region: Secondary | ICD-10-CM

## 2023-05-11 DIAGNOSIS — M7712 Lateral epicondylitis, left elbow: Secondary | ICD-10-CM

## 2023-05-11 DIAGNOSIS — M792 Neuralgia and neuritis, unspecified: Secondary | ICD-10-CM

## 2023-05-11 DIAGNOSIS — Z8669 Personal history of other diseases of the nervous system and sense organs: Secondary | ICD-10-CM

## 2023-05-11 DIAGNOSIS — S93491D Sprain of other ligament of right ankle, subsequent encounter: Secondary | ICD-10-CM

## 2023-05-11 DIAGNOSIS — M7661 Achilles tendinitis, right leg: Secondary | ICD-10-CM

## 2023-05-11 DIAGNOSIS — R52 Pain, unspecified: Secondary | ICD-10-CM | POA: Diagnosis not present

## 2023-05-11 DIAGNOSIS — F900 Attention-deficit hyperactivity disorder, predominantly inattentive type: Secondary | ICD-10-CM

## 2023-05-11 DIAGNOSIS — Z8639 Personal history of other endocrine, nutritional and metabolic disease: Secondary | ICD-10-CM

## 2023-05-11 DIAGNOSIS — Z8659 Personal history of other mental and behavioral disorders: Secondary | ICD-10-CM

## 2023-05-11 DIAGNOSIS — K760 Fatty (change of) liver, not elsewhere classified: Secondary | ICD-10-CM

## 2023-05-11 LAB — B12 AND FOLATE PANEL: Folate: 10 ng/mL

## 2023-05-11 LAB — VITAMIN D 25 HYDROXY (VIT D DEFICIENCY, FRACTURES): Vit D, 25-Hydroxy: 44 ng/mL (ref 30–100)

## 2023-05-11 LAB — SEDIMENTATION RATE: Sed Rate: 101 mm/h — ABNORMAL HIGH (ref 0–20)

## 2023-05-11 LAB — TSH: TSH: 2.02 mIU/L

## 2023-05-13 ENCOUNTER — Ambulatory Visit: Admitting: Neurology

## 2023-05-13 DIAGNOSIS — R202 Paresthesia of skin: Secondary | ICD-10-CM

## 2023-05-13 NOTE — Procedures (Signed)
[facility]  [address] [place], [address]  [place], [place] [address] Tel: [phone] Fax: [phone] Test Date:  05/13/2023  Patient: Tracey Malone DOB: May 15, 1976 Physician: Nita Sickle, DO  Sex: Female Height: 5\' 5"  Ref Phys: Lynnea Ferrier, MD  ID#: [id number]   Technician:    History: This is a 47 year old female referred for evaluation of generalized pain in the upper extremities.  NCV & EMG Findings: Extensive electrodiagnostic testing of the right upper extremity and additional studies of the left shows: Bilateral median, ulnar, and mixed palmar sensory responses are within normal limits. Bilateral median and ulnar motor responses are within normal limits. There is no evidence of active or chronic motor axonal loss changes affecting any of the tested muscles.  Motor unit configuration and recruitment pattern is within normal limits.   Impression: This is a normal study of the upper extremities.  In particular, there is no evidence of carpal tunnel syndrome, ulnar neuropathy, sensorimotor polyneuropathy, or a cervical radiculopathy.   ___________________________ Nita Sickle, DO    Nerve Conduction Studies   Stim Site NR Peak (ms) Norm Peak (ms) O-P Amp (V) Norm O-P Amp  Left Median Anti Sensory (2nd Digit)  32 C  Wrist    2.7 <3.4 70.7 >20  Right Median Anti Sensory (2nd Digit)  32 C  Wrist    2.5 <3.4 63.2 >20  Left Ulnar Anti Sensory (5th Digit)  32 C  Wrist    2.7 <3.1 54.5 >12  Right Ulnar Anti Sensory (5th Digit)  32 C  Wrist    2.4 <3.1 51.0 >12     Stim Site NR Onset (ms) Norm Onset (ms) O-P Amp (mV) Norm O-P Amp Site1 Site2 Delta-0 (ms) Dist (cm) Vel (m/s) Norm Vel (m/s)  Left Median Motor (Abd Poll Brev)  32 C  Wrist    2.4 <3.9 10.8 >6 Elbow Wrist 4.3 28.0 65 >50  Elbow    6.7  10.7         Right Median Motor (Abd Poll Brev)  32 C  Wrist    2.3 <3.9 10.8 >6 Elbow Wrist 3.8 26.0 68 >50  Elbow    6.1  10.7         Left Ulnar  Motor (Abd Dig Minimi)  32 C  Wrist    1.8 <3.1 9.0 >7 B Elbow Wrist 2.9 21.0 72 >50  B Elbow    4.7  8.1  A Elbow B Elbow 1.5 10.0 67 >50  A Elbow    6.2  7.8         Right Ulnar Motor (Abd Dig Minimi)  32 C  Wrist    1.6 <3.1 8.6 >7 B Elbow Wrist 3.3 21.0 64 >50  B Elbow    4.9  8.1  A Elbow B Elbow 1.4 10.0 71 >50  A Elbow    6.3  7.9            Stim Site NR Peak (ms) Norm Peak (ms) P-T Amp (V) Site1 Site2 Delta-P (ms) Norm Delta (ms)  Left Median/Ulnar Palm Comparison (Wrist - 8cm)  32 C  Median Palm    1.3 <2.2 148.2 Median Palm Ulnar Palm 0.2   Ulnar Palm    1.5 <2.2 49.7      Right Median/Ulnar Palm Comparison (Wrist - 8cm)  32 C  Median Palm    1.2 <2.2 164.0 Median Palm Ulnar Palm 0.1   Ulnar Palm    1.3 <2.2  43.1       Electromyography   Side Muscle Ins.Act Fibs Fasc Recrt Amp Dur Poly Activation Comment  Right 1stDorInt Nml Nml Nml Nml Nml Nml Nml Nml N/A  Right PronatorTeres Nml Nml Nml Nml Nml Nml Nml Nml N/A  Right Biceps Nml Nml Nml Nml Nml Nml Nml Nml N/A  Right Triceps Nml Nml Nml Nml Nml Nml Nml Nml N/A  Right Deltoid Nml Nml Nml Nml Nml Nml Nml Nml N/A  Left 1stDorInt Nml Nml Nml Nml Nml Nml Nml Nml N/A  Left PronatorTeres Nml Nml Nml Nml Nml Nml Nml Nml N/A  Left Biceps Nml Nml Nml Nml Nml Nml Nml Nml N/A  Left Triceps Nml Nml Nml Nml Nml Nml Nml Nml N/A  Left Deltoid Nml Nml Nml Nml Nml Nml Nml Nml N/A      Waveforms:

## 2023-05-19 ENCOUNTER — Other Ambulatory Visit: Payer: Self-pay | Admitting: Family Medicine

## 2023-05-19 LAB — B12 AND FOLATE PANEL: Vitamin B-12: >2000 pg/mL — ABNORMAL HIGH (ref 200–1100)

## 2023-05-19 MED ORDER — DULOXETINE HCL 60 MG PO CPEP
60.0000 mg | ORAL_CAPSULE | Freq: Every day | ORAL | 3 refills | Status: AC
Start: 2023-05-19 — End: ?

## 2023-05-20 ENCOUNTER — Encounter: Admitting: Neurology

## 2023-05-20 NOTE — Progress Notes (Signed)
I called patient.  I will forward her labs to Dr. Gailen Shelter and Dr. Allena Katz.  Thank you

## 2023-05-20 NOTE — Progress Notes (Signed)
Labs indicate HSV-2 past infection, varicella-zoster possible recent infection, EBV past infection.  Sedimentation rate stays elevated.  All of the serology was negative which will be discussed at the follow-up visit.  Please forward results to her ophthalmologist.

## 2023-05-23 ENCOUNTER — Telehealth: Payer: Self-pay

## 2023-05-23 NOTE — Progress Notes (Signed)
Dr. Gailen Shelter, all the lab results are available now.  I was under the impression that patient has an appointment with the neurologist on August 16.  In my opinion she will benefit from an neurology evaluation. Thank you, Pollyann Savoy, MD

## 2023-05-23 NOTE — Telephone Encounter (Signed)
Pt states she feels "spaced out" since starting Cymbalta. Pt states the Lyrica or the Cymbalta hasn't really helped a lot for her pain. Pt states she also feels, exhausted. Pt asks for advised as to what the next steps should be?

## 2023-05-26 ENCOUNTER — Other Ambulatory Visit: Payer: Self-pay | Admitting: Family Medicine

## 2023-05-26 DIAGNOSIS — R203 Hyperesthesia: Secondary | ICD-10-CM

## 2023-05-26 DIAGNOSIS — R700 Elevated erythrocyte sedimentation rate: Secondary | ICD-10-CM

## 2023-05-26 DIAGNOSIS — G629 Polyneuropathy, unspecified: Secondary | ICD-10-CM

## 2023-06-22 ENCOUNTER — Encounter: Admitting: Rheumatology

## 2023-06-29 ENCOUNTER — Other Ambulatory Visit: Payer: Self-pay

## 2023-06-29 ENCOUNTER — Encounter (HOSPITAL_BASED_OUTPATIENT_CLINIC_OR_DEPARTMENT_OTHER): Payer: Self-pay | Admitting: Orthopaedic Surgery

## 2023-07-01 NOTE — Progress Notes (Deleted)
Office Visit Note  Patient: Tracey Malone             Date of Birth: Jun 06, 1976           MRN: [id number]             PCP: Donita Brooks, MD Referring: Donita Brooks, MD Visit Date: 07/15/2023 Occupation: @GUAROCC @  Subjective:  No chief complaint on file.   History of Present Illness: Tracey Malone is a 47 y.o. female ***     Activities of Daily Living:  Patient reports morning stiffness for *** {minute/hour:19697}.   Patient {ACTIONS;DENIES/REPORTS:21021675::"Denies"} nocturnal pain.  Difficulty dressing/grooming: {ACTIONS;DENIES/REPORTS:21021675::"Denies"} Difficulty climbing stairs: {ACTIONS;DENIES/REPORTS:21021675::"Denies"} Difficulty getting out of chair: {ACTIONS;DENIES/REPORTS:21021675::"Denies"} Difficulty using hands for taps, buttons, cutlery, and/or writing: {ACTIONS;DENIES/REPORTS:21021675::"Denies"}  No Rheumatology ROS completed.   PMFS History:  Patient Active Problem List   Diagnosis Date Noted   Achilles tendinitis of right lower extremity 12/20/2022   Positive Tinel's sign 12/20/2022   Overweight (BMI 25.0-29.9) 05/27/2022   Plantar fasciitis, right 01/20/2022   Anxiety 10/27/2021   Fatty (change of) liver, not elsewhere classified 10/27/2021   Hot flashes 10/27/2021   Hyperlipidemia 10/27/2021   Leukocytosis 10/27/2021   Low libido 10/27/2021   Menopause 10/27/2021   Sleep disturbance 10/27/2021   Subclinical hypothyroidism 10/27/2021   Vaginal dryness 10/27/2021   Ganglion cyst of dorsum of right wrist 07/23/2021   Inflammatory pain 06/12/2021   Lateral epicondylitis, left elbow 02/26/2021   Pain of left forearm 02/25/2021   Injury of peroneal tendon of right foot 03/20/2018   Contracted, tendon sheath 10/06/2017   Chronic pain of right ankle 07/25/2017   Pain in right ankle and joints of right foot 02/14/2017   Tear of talofibular ligament of right lower extremity 02/14/2017   Bell's palsy    UTI (urinary tract infection)  06/27/2013   GERD (gastroesophageal reflux disease) 06/27/2013   ADD (attention deficit disorder) 06/27/2013   Migraines     Past Medical History:  Diagnosis Date   ADD (attention deficit disorder)    Bell's palsy    Migraines     Family History  Problem Relation Age of Onset   COPD Father    Healthy Son    Healthy Daughter    Past Surgical History:  Procedure Laterality Date   BREAST ENHANCEMENT SURGERY  2013   FOOT SURGERY Right 2019   ligament   GANGLION CYST EXCISION Right 2022   HEMORRHOID SURGERY     INCONTINENCE SURGERY     tommy John surgery Left 2022   TUBAL LIGATION     Social History   Social History Narrative   Not on file   Immunization History  Administered Date(s) Administered   Influenza,inj,Quad PF,6+ Mos 06/27/2013, 08/12/2014   Tdap 08/09/2013     Objective: Vital Signs: There were no vitals taken for this visit.   Physical Exam   Musculoskeletal Exam: ***  CDAI Exam: CDAI Score: -- Patient Global: --; Provider Global: -- Swollen: --; Tender: -- Joint Exam 07/15/2023   No joint exam has been documented for this visit   There is currently no information documented on the homunculus. Go to the Rheumatology activity and complete the homunculus joint exam.  Investigation: No additional findings.  Imaging: No results found.  Recent Labs: Lab Results  Component Value Date   WBC 7.1 04/19/2023   HGB 11.7 04/19/2023   PLT 286 04/19/2023   NA 139 04/19/2023   K 4.0 04/19/2023  CL 105 04/19/2023   CO2 27 04/19/2023   GLUCOSE 75 04/19/2023   BUN 14 04/19/2023   CREATININE 0.65 04/19/2023   BILITOT 0.4 04/19/2023   ALKPHOS 71 03/30/2022   AST 16 04/19/2023   ALT 20 04/19/2023   PROT 6.8 05/11/2023   ALBUMIN 4.4 03/30/2022   CALCIUM 8.9 04/19/2023   GFRAA >60 09/21/2018   May 11, 2023 SPEP normal, EBV IgG positive, IgM negative, HSV-2 IgG positive, varicella-zoster IgM positive, Treponema pallidum antibodies negative, FTA  antibody nonreactive, TSH normal, vitamin D normal, ESR 101, ANCA negative, MPO negative, serum proteinase 3 negative, B12> 2000, folate 10  03/24/23: ESR 103, CRP WNL. 04/04/23: ESR 77. 04/19/23: ESR 79. 04/29/23: ESR 106, CRP WNL , Lyme disease-, B burgdorferi-, RMSF-, ESR 77, ANA negative, RF negative, CK WNL    Speciality Comments: No specialty comments available.  Procedures:  No procedures performed Allergies: Codeine, Topamax [topiramate], Treximet [sumatriptan-naproxen sodium], and Penicillins   Assessment / Plan:     Visit Diagnoses: No diagnosis found.  Orders: No orders of the defined types were placed in this encounter.  No orders of the defined types were placed in this encounter.   Face-to-face time spent with patient was *** minutes. Greater than 50% of time was spent in counseling and coordination of care.  Follow-Up Instructions: No follow-ups on file.   Pollyann Savoy, MD  Note - This record has been created using Animal nutritionist.  Chart creation errors have been sought, but may not always  have been located. Such creation errors do not reflect on  the standard of medical care.

## 2023-07-05 NOTE — Discharge Instructions (Signed)

## 2023-07-05 NOTE — H&P (Signed)
ORTHOPAEDIC SURGERY H&P  Subjective:  The patient presents with left ankle instability.   Past Medical History:  Diagnosis Date   ADD (attention deficit disorder)    Bell's palsy    Migraines     Past Surgical History:  Procedure Laterality Date   BREAST ENHANCEMENT SURGERY  2013   FOOT SURGERY Right 2019   ligament   GANGLION CYST EXCISION Right 2022   HEMORRHOID SURGERY     INCONTINENCE SURGERY     tommy John surgery Left 2022   TUBAL LIGATION       (Not in an outpatient encounter)    Allergies  Allergen Reactions   Codeine Shortness Of Breath   Topamax [Topiramate]     Cognitive Slowing--types same word over and over. Cannot get out correct words when talking.   Treximet [Sumatriptan-Naproxen Sodium] Shortness Of Breath and Swelling    Throat gets tight.   Penicillins Swelling    Swelling unknown  Has patient had a PCN reaction causing immediate rash, facial/tongue/throat swelling, SOB or lightheadedness with hypotension: Unknown Has patient had a PCN reaction causing severe rash involving mucus membranes or skin necrosis: No  Has patient had a PCN reaction that required hospitalization: No  Has patient had a PCN reaction occurring within the last 10 years: No  If all of the above answers are "NO", then may proceed with Cep    Social History   Socioeconomic History   Marital status: Married    Spouse name: Not on file   Number of children: Not on file   Years of education: Not on file   Highest education level: Not on file  Occupational History   Not on file  Tobacco Use   Smoking status: Former    Current packs/day: 0.00    Average packs/day: 1 pack/day for 21.0 years (21.0 ttl pk-yrs)    Types: Cigarettes    Start date: 22    Quit date: 2012    Years since quitting: 12.7    Passive exposure: Never   Smokeless tobacco: Never  Vaping Use   Vaping status: Never Used  Substance and Sexual Activity   Alcohol use: Yes    Comment: ocasional   Drug  use: No   Sexual activity: Yes    Birth control/protection: Surgical    Comment: Married to [employer], two kids.  Retired from [employer].  Other Topics Concern   Not on file  Social History Narrative   Not on file   Social Determinants of Health   Financial Resource Strain: Not on file  Food Insecurity: Not on file  Transportation Needs: Not on file  Physical Activity: Not on file  Stress: Not on file  Social Connections: Not on file  Intimate Partner Violence: Not on file     History reviewed. No pertinent family history.   Review of Systems Pertinent items are noted in HPI.  Objective: Vital signs in last 24 hours:    06/29/2023    2:30 PM 05/11/2023    9:11 AM 05/11/2023    8:53 AM  Vitals with BMI  Height 5\' 5"   5\' 5"   Weight 150 lbs  162 lbs 3 oz  BMI 24.96  26.99  Systolic  120 118  Diastolic  78 80  Pulse  76 76      EXAM: General: Well nourished, well developed. Awake, alert and oriented to time, place, person. Normal mood and affect. No apparent distress. Breathing room air.  Operative Lower Extremity: Alignment -  Neutral Deformity - None Skin intact Tenderness to palpation - left lateral ankle 5/5 TA, PT, GS, Per, EHL, FHL Sensation intact to light touch throughout Palpable DP and PT pulses Special testing: None  The contralateral foot/ankle was examined for comparison and noted to be neurovascularly intact with no localized deformity, swelling, or tenderness.  Imaging Review All images taken were independently reviewed by me.  Assessment/Plan: The clinical and radiographic findings were reviewed and discussed at length with the patient.  The patient has left ankle instability.  We spoke at length about the natural course of these findings. We discussed nonoperative and operative treatment options in detail.  The risks and benefits were presented and reviewed. The risks due to implant failure/irritation, infection, stiffness, nerve/vessel/tendon  injury, wound healing issues, failure of this surgery, need for further surgery, thromboembolic events, amputation, death among others were discussed. The patient acknowledged the explanation and agreed to proceed with the plan.  Netta Cedars  Orthopaedic Surgery [facility]

## 2023-07-06 ENCOUNTER — Other Ambulatory Visit: Payer: Self-pay

## 2023-07-06 ENCOUNTER — Ambulatory Visit (HOSPITAL_BASED_OUTPATIENT_CLINIC_OR_DEPARTMENT_OTHER): Admitting: Certified Registered"

## 2023-07-06 ENCOUNTER — Encounter (HOSPITAL_BASED_OUTPATIENT_CLINIC_OR_DEPARTMENT_OTHER): Payer: Self-pay | Admitting: Orthopaedic Surgery

## 2023-07-06 ENCOUNTER — Ambulatory Visit (HOSPITAL_BASED_OUTPATIENT_CLINIC_OR_DEPARTMENT_OTHER)
Admission: RE | Admit: 2023-07-06 | Discharge: 2023-07-06 | Disposition: A | Discharge status: Home / Self Care | Attending: Orthopaedic Surgery | Admitting: Orthopaedic Surgery

## 2023-07-06 ENCOUNTER — Encounter (HOSPITAL_BASED_OUTPATIENT_CLINIC_OR_DEPARTMENT_OTHER)
Admission: RE | Disposition: A | Discharge status: Home / Self Care | Payer: Self-pay | Source: Home / Self Care | Attending: Orthopaedic Surgery

## 2023-07-06 ENCOUNTER — Ambulatory Visit (HOSPITAL_BASED_OUTPATIENT_CLINIC_OR_DEPARTMENT_OTHER)

## 2023-07-06 DIAGNOSIS — K219 Gastro-esophageal reflux disease without esophagitis: Secondary | ICD-10-CM | POA: Insufficient documentation

## 2023-07-06 DIAGNOSIS — Z01818 Encounter for other preprocedural examination: Secondary | ICD-10-CM

## 2023-07-06 DIAGNOSIS — M25372 Other instability, left ankle: Secondary | ICD-10-CM | POA: Insufficient documentation

## 2023-07-06 DIAGNOSIS — Z87891 Personal history of nicotine dependence: Secondary | ICD-10-CM | POA: Diagnosis not present

## 2023-07-06 DIAGNOSIS — M19071 Primary osteoarthritis, right ankle and foot: Secondary | ICD-10-CM | POA: Diagnosis present

## 2023-07-06 DIAGNOSIS — S93492A Sprain of other ligament of left ankle, initial encounter: Secondary | ICD-10-CM | POA: Insufficient documentation

## 2023-07-06 DIAGNOSIS — X58XXXA Exposure to other specified factors, initial encounter: Secondary | ICD-10-CM | POA: Insufficient documentation

## 2023-07-06 HISTORY — PX: TENDON REPAIR: SHX5111

## 2023-07-06 HISTORY — PX: ANKLE ARTHROSCOPY WITH RECONSTRUCTION: SHX5583

## 2023-07-06 HISTORY — PX: STERIOD INJECTION: SHX5046

## 2023-07-06 LAB — POCT PREGNANCY, URINE: Preg Test, Ur: NEGATIVE

## 2023-07-06 SURGERY — ARTHROSCOPY, ANKLE, WITH RECONSTRUCTION
Anesthesia: Regional | Site: Foot | Laterality: Right

## 2023-07-06 MED ORDER — CEFAZOLIN SODIUM-DEXTROSE 2-4 GM/100ML-% IV SOLN
2.0000 g | INTRAVENOUS | Status: AC
  Administered 2023-07-06: 2 g via INTRAVENOUS

## 2023-07-06 MED ORDER — POVIDONE-IODINE 10 % EX SOLN
CUTANEOUS | Status: DC | PRN
Start: 2023-07-06 — End: 2023-07-06
  Administered 2023-07-06: 1 via TOPICAL

## 2023-07-06 MED ORDER — LIDOCAINE HCL (CARDIAC) PF 100 MG/5ML IV SOSY
PREFILLED_SYRINGE | INTRAVENOUS | Status: DC | PRN
  Administered 2023-07-06: 20 mg via INTRAVENOUS

## 2023-07-06 MED ORDER — LIDOCAINE 2% (20 MG/ML) 5 ML SYRINGE
INTRAMUSCULAR | Status: AC
  Filled 2023-07-06: qty 5

## 2023-07-06 MED ORDER — MIDAZOLAM HCL 2 MG/2ML IJ SOLN
INTRAMUSCULAR | Status: AC
  Filled 2023-07-06: qty 2

## 2023-07-06 MED ORDER — PHENYLEPHRINE 80 MCG/ML (10ML) SYRINGE FOR IV PUSH (FOR BLOOD PRESSURE SUPPORT)
PREFILLED_SYRINGE | INTRAVENOUS | Status: AC
  Filled 2023-07-06: qty 10

## 2023-07-06 MED ORDER — ACETAMINOPHEN 500 MG PO TABS
1000.0000 mg | ORAL_TABLET | Freq: Once | ORAL | Status: AC
  Administered 2023-07-06: 1000 mg via ORAL

## 2023-07-06 MED ORDER — CHLORHEXIDINE GLUCONATE 4 % EX SOLN: 60.0000 mL | Freq: Once | CUTANEOUS | Status: DC

## 2023-07-06 MED ORDER — ONDANSETRON HCL 4 MG/2ML IJ SOLN
INTRAMUSCULAR | Status: AC
  Filled 2023-07-06: qty 2

## 2023-07-06 MED ORDER — MIDAZOLAM HCL 2 MG/2ML IJ SOLN
2.0000 mg | Freq: Once | INTRAMUSCULAR | Status: AC
  Administered 2023-07-06: 2 mg via INTRAVENOUS

## 2023-07-06 MED ORDER — OXYCODONE HCL 5 MG PO TABS
ORAL_TABLET | ORAL | Status: AC
  Filled 2023-07-06: qty 1

## 2023-07-06 MED ORDER — EPHEDRINE 5 MG/ML INJ
INTRAVENOUS | Status: AC
  Filled 2023-07-06: qty 5

## 2023-07-06 MED ORDER — VANCOMYCIN HCL 500 MG IV SOLR
INTRAVENOUS | Status: DC | PRN
  Administered 2023-07-06: 500 mg via TOPICAL

## 2023-07-06 MED ORDER — TRIAMCINOLONE ACETONIDE 40 MG/ML IJ SUSP
INTRAMUSCULAR | Status: AC
  Filled 2023-07-06: qty 5

## 2023-07-06 MED ORDER — DEXAMETHASONE SODIUM PHOSPHATE 10 MG/ML IJ SOLN
INTRAMUSCULAR | Status: AC
  Filled 2023-07-06: qty 1

## 2023-07-06 MED ORDER — DEXAMETHASONE SODIUM PHOSPHATE 10 MG/ML IJ SOLN
INTRAMUSCULAR | Status: DC | PRN
Start: 2023-07-06 — End: 2023-07-06
  Administered 2023-07-06: 10 mg

## 2023-07-06 MED ORDER — ACETAMINOPHEN 500 MG PO TABS
ORAL_TABLET | ORAL | Status: AC
  Filled 2023-07-06: qty 2

## 2023-07-06 MED ORDER — FENTANYL CITRATE (PF) 100 MCG/2ML IJ SOLN
INTRAMUSCULAR | Status: AC
  Filled 2023-07-06: qty 2

## 2023-07-06 MED ORDER — SODIUM CHLORIDE 0.9 % IR SOLN
Status: DC | PRN
  Administered 2023-07-06: 250 mL

## 2023-07-06 MED ORDER — VANCOMYCIN HCL 500 MG IV SOLR
INTRAVENOUS | Status: AC
  Filled 2023-07-06: qty 30

## 2023-07-06 MED ORDER — PROPOFOL 10 MG/ML IV BOLUS
INTRAVENOUS | Status: AC
  Filled 2023-07-06: qty 20

## 2023-07-06 MED ORDER — LIDOCAINE HCL (PF) 0.5 % IJ SOLN
INTRAMUSCULAR | Status: AC
  Filled 2023-07-06: qty 50

## 2023-07-06 MED ORDER — FENTANYL CITRATE (PF) 100 MCG/2ML IJ SOLN
INTRAMUSCULAR | Status: DC | PRN
  Administered 2023-07-06: 50 ug via INTRAVENOUS

## 2023-07-06 MED ORDER — CEFAZOLIN SODIUM-DEXTROSE 2-4 GM/100ML-% IV SOLN
INTRAVENOUS | Status: AC
  Filled 2023-07-06: qty 100

## 2023-07-06 MED ORDER — BUPIVACAINE LIPOSOME 1.3 % IJ SUSP
INTRAMUSCULAR | Status: DC | PRN
Start: 2023-07-06 — End: 2023-07-06
  Administered 2023-07-06: 10 mL via PERINEURAL

## 2023-07-06 MED ORDER — LIDOCAINE HCL (PF) 0.5 % IJ SOLN
INTRAMUSCULAR | Status: DC | PRN
Start: 2023-07-06 — End: 2023-07-06
  Administered 2023-07-06: 2 mL

## 2023-07-06 MED ORDER — BUPIVACAINE HCL (PF) 0.5 % IJ SOLN
INTRAMUSCULAR | Status: DC | PRN
Start: 2023-07-06 — End: 2023-07-06
  Administered 2023-07-06: 20 mL via PERINEURAL

## 2023-07-06 MED ORDER — PHENYLEPHRINE HCL (PRESSORS) 10 MG/ML IV SOLN
INTRAVENOUS | Status: DC | PRN
  Administered 2023-07-06: 80 ug via INTRAVENOUS

## 2023-07-06 MED ORDER — SODIUM CHLORIDE 0.9 % IR SOLN
Status: DC | PRN
Start: 2023-07-06 — End: 2023-07-06
  Administered 2023-07-06: 100 mL

## 2023-07-06 MED ORDER — OXYCODONE HCL 5 MG PO TABS
5.0000 mg | ORAL_TABLET | Freq: Once | ORAL | Status: AC
  Administered 2023-07-06: 5 mg via ORAL

## 2023-07-06 MED ORDER — ROPIVACAINE HCL 5 MG/ML IJ SOLN
INTRAMUSCULAR | Status: DC | PRN
Start: 2023-07-06 — End: 2023-07-06
  Administered 2023-07-06: 20 mL via PERINEURAL

## 2023-07-06 MED ORDER — PROPOFOL 10 MG/ML IV BOLUS
INTRAVENOUS | Status: DC | PRN
  Administered 2023-07-06: 200 mg via INTRAVENOUS

## 2023-07-06 MED ORDER — DEXAMETHASONE SODIUM PHOSPHATE 10 MG/ML IJ SOLN
INTRAMUSCULAR | Status: DC | PRN
Start: 2023-07-06 — End: 2023-07-06
  Administered 2023-07-06: 10 mg via INTRAVENOUS

## 2023-07-06 MED ORDER — FENTANYL CITRATE (PF) 100 MCG/2ML IJ SOLN
25.0000 ug | INTRAMUSCULAR | Status: DC | PRN
  Administered 2023-07-06 (×2): 50 ug via INTRAVENOUS

## 2023-07-06 MED ORDER — TRIAMCINOLONE ACETONIDE 40 MG/ML IJ SUSP
INTRAMUSCULAR | Status: DC | PRN
Start: 2023-07-06 — End: 2023-07-06
  Administered 2023-07-06: 40 mg

## 2023-07-06 MED ORDER — EPHEDRINE SULFATE (PRESSORS) 50 MG/ML IJ SOLN
INTRAMUSCULAR | Status: DC | PRN
  Administered 2023-07-06 (×4): 5 mg via INTRAVENOUS

## 2023-07-06 MED ORDER — LACTATED RINGERS IV SOLN: INTRAVENOUS | Status: DC

## 2023-07-06 MED ORDER — FENTANYL CITRATE (PF) 100 MCG/2ML IJ SOLN
100.0000 ug | Freq: Once | INTRAMUSCULAR | Status: AC
  Administered 2023-07-06: 50 ug via INTRAVENOUS

## 2023-07-06 SURGICAL SUPPLY — 82 items
ANCH SUT NDL DX FBRTK STRL LF (Anchor) ×3 IMPLANT
ANCH SUT NDL STRL LF DX FBRTK (Anchor) ×3 IMPLANT
ANCHOR SUT FBRTK 1.3 SUTTAP (Anchor) IMPLANT
ANCHOR SUT FIBERTAK DX DBL (Anchor) IMPLANT
APL PRP STRL LF DISP 70% ISPRP (MISCELLANEOUS) ×3
ASCP RGD 125 NANO NDL SCP (MISCELLANEOUS) ×3
BANDAGE ESMARK 6X9 LF (GAUZE/BANDAGES/DRESSINGS) IMPLANT
BLADE SURG 15 STRL LF DISP TIS (BLADE) ×4 IMPLANT
BLADE SURG 15 STRL SS (BLADE) ×12
BNDG CMPR 5X4 KNIT ELC UNQ LF (GAUZE/BANDAGES/DRESSINGS) ×3
BNDG CMPR 9X6 STRL LF SNTH (GAUZE/BANDAGES/DRESSINGS)
BNDG CMPR MED 10X6 ELC LF (GAUZE/BANDAGES/DRESSINGS) ×3
BNDG ELASTIC 4INX 5YD STR LF (GAUZE/BANDAGES/DRESSINGS) ×4 IMPLANT
BNDG ELASTIC 6X10 VLCR STRL LF (GAUZE/BANDAGES/DRESSINGS) ×4 IMPLANT
BNDG ESMARK 6X9 LF (GAUZE/BANDAGES/DRESSINGS)
BNDG GAUZE DERMACEA FLUFF 4 (GAUZE/BANDAGES/DRESSINGS) ×4 IMPLANT
BNDG GZE DERMACEA 4 6PLY (GAUZE/BANDAGES/DRESSINGS) ×3
BOOT STEPPER DURA LG (SOFTGOODS) IMPLANT
BOOT STEPPER DURA MED (SOFTGOODS) IMPLANT
BOOT STEPPER DURA SM (SOFTGOODS) IMPLANT
BURR OVAL 8 FLU 4.0X13 (MISCELLANEOUS) IMPLANT
CHLORAPREP W/TINT 26 (MISCELLANEOUS) ×4 IMPLANT
CUFF TOURN SGL QUICK 34 (TOURNIQUET CUFF) ×3
CUFF TRNQT CYL 34X4.125X (TOURNIQUET CUFF) IMPLANT
DISSECTOR 3.8MM X 13CM (MISCELLANEOUS) IMPLANT
DRAPE EXTREMITY T 121X128X90 (DISPOSABLE) ×4 IMPLANT
DRAPE OEC MINIVIEW 54X84 (DRAPES) IMPLANT
DRAPE U-SHAPE 47X51 STRL (DRAPES) ×4 IMPLANT
ELECT REM PT RETURN 9FT ADLT (ELECTROSURGICAL) ×3
ELECTRODE REM PT RTRN 9FT ADLT (ELECTROSURGICAL) ×4 IMPLANT
EXCALIBUR 3.8MM X 13CM (MISCELLANEOUS) IMPLANT
FACESHIELD WRAPAROUND (MASK) IMPLANT
FACESHIELD WRAPAROUND OR TEAM (MASK) ×4 IMPLANT
GAUZE PAD ABD 8X10 STRL (GAUZE/BANDAGES/DRESSINGS) ×8 IMPLANT
GAUZE SPONGE 4X4 12PLY STRL (GAUZE/BANDAGES/DRESSINGS) ×4 IMPLANT
GAUZE XEROFORM 1X8 LF (GAUZE/BANDAGES/DRESSINGS) ×4 IMPLANT
GLOVE BIOGEL PI IND STRL 8 (GLOVE) ×4 IMPLANT
GLOVE SURG SS PI 7.5 STRL IVOR (GLOVE) ×4 IMPLANT
GOWN STRL REUS W/ TWL LRG LVL3 (GOWN DISPOSABLE) ×4 IMPLANT
GOWN STRL REUS W/ TWL XL LVL3 (GOWN DISPOSABLE) ×8 IMPLANT
GOWN STRL REUS W/TWL LRG LVL3 (GOWN DISPOSABLE) ×3
GOWN STRL REUS W/TWL XL LVL3 (GOWN DISPOSABLE) ×3
IV NS IRRIG 3000ML ARTHROMATIC (IV SOLUTION) ×4 IMPLANT
KIT FIBERTAK DX 1.6 DISP (KITS) IMPLANT
MANIFOLD NEPTUNE II (INSTRUMENTS) ×4 IMPLANT
NANONEEDLE HIGHFLOW SHEATH 125 (SHEATH) ×3
NDL SUT 6 .5 CRC .975X.05 MAYO (NEEDLE) IMPLANT
NEEDLE MAYO TAPER (NEEDLE) ×3
NS IRRIG 1000ML POUR BTL (IV SOLUTION) IMPLANT
PACK ARTHROSCOPY DSU (CUSTOM PROCEDURE TRAY) ×4 IMPLANT
PACK BASIN DAY SURGERY FS (CUSTOM PROCEDURE TRAY) ×4 IMPLANT
PAD CAST 4YDX4 CTTN HI CHSV (CAST SUPPLIES) ×4 IMPLANT
PADDING CAST COTTON 4X4 STRL (CAST SUPPLIES) ×3
PADDING CAST COTTON 6X4 STRL (CAST SUPPLIES) ×4 IMPLANT
PENCIL SMOKE EVACUATOR (MISCELLANEOUS) IMPLANT
SANITIZER HAND PURELL FF 515ML (MISCELLANEOUS) ×4 IMPLANT
SCOPE NANONDL 125 (MISCELLANEOUS) IMPLANT
SCOPE NANONEEDLE 125 (MISCELLANEOUS) ×3 IMPLANT
SHAVER DISSECTOR 3.0 (BURR) IMPLANT
SHAVER SABRE 2.0 (BURR) IMPLANT
SHEATH NANONDL HIGHFLOW 125 (SHEATH) IMPLANT
SHEATH NANONEEDLE HIGHFLOW 125 (SHEATH) ×3 IMPLANT
SLEEVE SCD COMPRESS KNEE MED (STOCKING) ×4 IMPLANT
SPLINT PLASTER CAST FAST 5X30 (CAST SUPPLIES) IMPLANT
SPONGE T-LAP 18X18 ~~LOC~~+RFID (SPONGE) ×4 IMPLANT
STOCKINETTE 6 STRL (DRAPES) ×4 IMPLANT
STRAP ANKLE FOOT DISTRACTOR (ORTHOPEDIC SUPPLIES) ×4 IMPLANT
SUCTION TUBE FRAZIER 10FR DISP (SUCTIONS) ×4 IMPLANT
SUT ETHILON 2 0 FS 18 (SUTURE) ×4 IMPLANT
SUT VIC AB 0 CT1 27 (SUTURE) ×3
SUT VIC AB 0 CT1 27XBRD ANBCTR (SUTURE) IMPLANT
SUT VIC AB 0 SH 27 (SUTURE) IMPLANT
SUT VIC AB 2-0 CT1 27 (SUTURE)
SUT VIC AB 2-0 CT1 TAPERPNT 27 (SUTURE) IMPLANT
SUT VIC AB 3-0 SH 27 (SUTURE)
SUT VIC AB 3-0 SH 27X BRD (SUTURE) IMPLANT
SYR BULB EAR ULCER 3OZ GRN STR (SYRINGE) IMPLANT
TOWEL GREEN STERILE FF (TOWEL DISPOSABLE) ×4 IMPLANT
TUBE CONNECTING 20X1/4 (TUBING) IMPLANT
TUBING ARTHROSCOPY IRRIG 16FT (MISCELLANEOUS) ×4 IMPLANT
WAND ABLATOR APOLLO I90 (BUR) IMPLANT
WATER STERILE IRR 1000ML POUR (IV SOLUTION) ×4 IMPLANT

## 2023-07-06 NOTE — Progress Notes (Signed)
Assisted Dr. Woodrum with left, adductor canal, popliteal, ultrasound guided block. Side rails up, monitors on throughout procedure. See vital signs in flow sheet. Tolerated Procedure well. 

## 2023-07-06 NOTE — Anesthesia Procedure Notes (Signed)
Anesthesia Regional Block: Popliteal block   Pre-Anesthetic Checklist: , timeout performed,  Correct Patient, Correct Site, Correct Laterality,  Correct Procedure, Correct Position, site marked,  Risks and benefits discussed,  Pre-op evaluation,  At surgeon's request and post-op pain management  Laterality: Left  Prep: Maximum Sterile Barrier Precautions used, chloraprep       Needles:  Injection technique: Single-shot  Needle Type: Echogenic Stimulator Needle     Needle Length: 9cm  Needle Gauge: 21     Additional Needles:   Procedures:,,,, ultrasound used (permanent image in chart),,    Narrative:  Start time: 07/06/2023 9:55 AM End time: 07/06/2023 10:00 AM Injection made incrementally with aspirations every 5 mL. Anesthesiologist: Elmer Picker, MD

## 2023-07-06 NOTE — Transfer of Care (Signed)
Immediate Anesthesia Transfer of Care Note  Patient: LEVITA MONICAL  Procedure(s) Performed: ANKLE ARTHROSCOPY WITH lateral ankle stabilization Toniann Ket) (Left: Ankle) LEFT PERONEAL TENDON DEBRIDEMENT AND POSSIBLE TRANSFER (Left) STEROID INJECTION (Right: Foot)  Patient Location: PACU  Anesthesia Type:General  Level of Consciousness: awake and alert   Airway & Oxygen Therapy: Patient Spontanous Breathing and Patient connected to face mask oxygen  Post-op Assessment: Report given to RN and Post -op Vital signs reviewed and stable  Post vital signs: Reviewed and stable  Last Vitals:  Vitals Value Taken Time  BP 115/66 07/06/23 1200  Temp 36.1 C 07/06/23 1200  Pulse 91 07/06/23 1205  Resp 19 07/06/23 1205  SpO2 100 % 07/06/23 1205  Vitals shown include unfiled device data.  Last Pain:  Vitals:   07/06/23 1200  TempSrc:   PainSc: Asleep      Patients Stated Pain Goal: 3 (07/06/23 0848)  Complications: No notable events documented.

## 2023-07-06 NOTE — Anesthesia Procedure Notes (Signed)
Anesthesia Regional Block: Adductor canal block   Pre-Anesthetic Checklist: , timeout performed,  Correct Patient, Correct Site, Correct Laterality,  Correct Procedure, Correct Position, site marked,  Risks and benefits discussed,  Pre-op evaluation,  At surgeon's request and post-op pain management  Laterality: Left  Prep: Maximum Sterile Barrier Precautions used, chloraprep       Needles:  Injection technique: Single-shot  Needle Type: Echogenic Stimulator Needle     Needle Length: 9cm  Needle Gauge: 21     Additional Needles:   Procedures:,,,, ultrasound used (permanent image in chart),,    Narrative:  Start time: 07/06/2023 10:00 AM End time: 07/06/2023 10:03 AM Injection made incrementally with aspirations every 5 mL. Anesthesiologist: Elmer Picker, MD

## 2023-07-06 NOTE — Anesthesia Procedure Notes (Signed)
Procedure Name: LMA Insertion Date/Time: 07/06/2023 10:30 AM  Performed by: Lauralyn Primes, CRNAPre-anesthesia Checklist: Patient identified, Emergency Drugs available, Suction available and Patient being monitored Patient Re-evaluated:Patient Re-evaluated prior to induction Oxygen Delivery Method: Circle system utilized Preoxygenation: Pre-oxygenation with 100% oxygen Induction Type: IV induction Ventilation: Mask ventilation without difficulty LMA: LMA inserted LMA Size: 4.0 Number of attempts: 1 Airway Equipment and Method: Bite block Placement Confirmation: positive ETCO2 Tube secured with: Tape Dental Injury: Teeth and Oropharynx as per pre-operative assessment

## 2023-07-06 NOTE — Anesthesia Preprocedure Evaluation (Addendum)
Anesthesia Evaluation  Patient identified by MRN, date of birth, ID band Patient awake    Reviewed: Allergy & Precautions, NPO status , Patient's Chart, lab work & pertinent test results  Airway Mallampati: I  TM Distance: >3 FB Neck ROM: Full    Dental no notable dental hx. (+) Teeth Intact, Dental Advisory Given   Pulmonary former smoker   Pulmonary exam normal breath sounds clear to auscultation       Cardiovascular negative cardio ROS Normal cardiovascular exam Rhythm:Regular Rate:Normal     Neuro/Psych  Headaches PSYCHIATRIC DISORDERS Anxiety        GI/Hepatic Neg liver ROS,GERD  ,,  Endo/Other  negative endocrine ROS    Renal/GU negative Renal ROS  negative genitourinary   Musculoskeletal negative musculoskeletal ROS (+)    Abdominal   Peds  (+) ATTENTION DEFICIT DISORDER WITHOUT HYPERACTIVITY Hematology negative hematology ROS (+)   Anesthesia Other Findings   Reproductive/Obstetrics                             Anesthesia Physical Anesthesia Plan  ASA: 2  Anesthesia Plan: General and Regional   Post-op Pain Management: Regional block* and Tylenol PO (pre-op)*   Induction: Intravenous  PONV Risk Score and Plan: 3 and Ondansetron, Dexamethasone and Midazolam  Airway Management Planned: LMA  Additional Equipment:   Intra-op Plan:   Post-operative Plan: Extubation in OR  Informed Consent: I have reviewed the patients History and Physical, chart, labs and discussed the procedure including the risks, benefits and alternatives for the proposed anesthesia with the patient or authorized representative who has indicated his/her understanding and acceptance.     Dental advisory given  Plan Discussed with: CRNA  Anesthesia Plan Comments:        Anesthesia Quick Evaluation

## 2023-07-06 NOTE — H&P (Signed)
H&P Update:  -History and Physical Reviewed  -Patient has been re-examined  -No change in the plan of care  -The risks and benefits were presented and reviewed. The risks due to arthroscopy, hardware/suture failure and/or irritation, new/persistent infection, stiffness, nerve/vessel/tendon injury or rerupture of repaired tendon, nonunion/malunion, allograft usage, wound healing issues, development of arthritis, failure of this surgery, possibility of delayed definitive surgery, need for further surgery, thromboembolic events, anesthesia/medical complications, amputation, death among others were discussed. The patient acknowledged the explanation, agreed to proceed with the plan and a consent was signed.  Tracey Malone

## 2023-07-07 ENCOUNTER — Encounter (HOSPITAL_BASED_OUTPATIENT_CLINIC_OR_DEPARTMENT_OTHER): Payer: Self-pay | Admitting: Orthopaedic Surgery

## 2023-07-07 ENCOUNTER — Other Ambulatory Visit: Payer: Self-pay | Admitting: Family Medicine

## 2023-07-10 NOTE — Op Note (Signed)
07/06/2023  12:51 PM   PATIENT: Tracey Malone  47 y.o. female  MRN: [id number]   PRE-OPERATIVE DIAGNOSIS:   LEFT - Sprain of lateral ligament of ankle joint with peroneal tendinopathy RIGHT - subtalar joint osteoarthritis   POST-OPERATIVE DIAGNOSIS:   Same   PROCEDURE: 1) Right subtalar joint injection under image guidance 2) Left ankle arthroscopic assisted debridement 3) Left ankle open stabilization of lateral ankle ligament complex 4) Left ankle open debridement and tenosynovectomy of peroneal tendons   SURGEON:  Netta Cedars, MD   ASSISTANT: None   ANESTHESIA: General, regional   EBL: Minimal   TOURNIQUET:    Total Tourniquet Time Documented: Thigh (Left) - 39 minutes Total: Thigh (Left) - 39 minutes    COMPLICATIONS: None apparent   DISPOSITION: Extubated, awake and stable to recovery.   INDICATION FOR PROCEDURE: The patient presented with above diagnosis.  We discussed the diagnosis, alternative treatment options, risks and benefits of the above surgical intervention, as well as alternative non-operative treatments. All questions/concerns were addressed and the patient/family demonstrated appropriate understanding of the diagnosis, the procedure, the postoperative course, and overall prognosis. The patient wished to proceed with surgical intervention and signed an informed surgical consent as such, in each others presence prior to surgery.   PROCEDURE IN DETAIL: After preoperative consent was obtained and the correct operative site was identified, the patient was brought to the operating room supine on stretcher and transferred onto operating table. General anesthesia was induced. Preoperative antibiotics were administered. Surgical timeout was taken. The patient was then positioned supine with an ipsilateral hip bump. The operative lower extremity was prepped and draped in standard sterile fashion with a tourniquet around the thigh. The  extremity was exsanguinated and the tourniquet was inflated to 275 mmHg.  We began by performing an image guided injection of the RIGHT subtalar joint using intraoperative fluoroscopy. 1 cc of Kenalog (40 mg/cc) and 2 cc of 0.5% lidocaine without epinephrine were injected.  Routine evaluation of the LEFT ankle joint demonstrated gross lateral laxity with drawer testing. Full dorsiflexion as well as plantarflexion was possible.   We began by insufflating the ankle joint thru anteromedial approach. The anteromedial portal was carefully established medial to the tibialis anterior tendon. The arthroscopic trochar with blunt was inserted and then camera placed. There was excellent visualization of the joint and routine diagnostic ankle arthroscopy was performed. Of note, there was mild synovitis throughout the joint noted. Mild chondral changes in the tibiotalar joint surfaces. No loose bodies were encountered and no anterior ankle impingement was identified on max dorsiflexion. The deltoid and syndesmosis ligaments were stressed and noted to be stable. Arthroscopic assisted debridement of the ankle joint was performed.    A standard curvilinear approach was made over the lateral ankle ligament complex and the distal lateral malleolus.    We began by windowing the approach posteriorly to access the peroneal tendons. The sheath was incised and tenosynovial fluid was readily evacuated. We then sequentially evaluated both the peroneus longus and brevis tendons with 20% tearing noted. We did note extensive tenosynovitis that was debrided thoroughly. There was no instability of peroneal tendons to intraoperative stress testing. The peroneus brevis tendon was debrided with excision of the tear and the sheath was closed with vicryl.    Dissection was the carried down to the level of the lateral ankle ligament complex. We sharply incised the capsule and the complex just distal to the tip of the fibula leaving a small  cuff of  tissue for repair after advancement. The proximal flap was elevated carefully off the lateral malleolus. We then used a rongeur to roughen the distal lateral malleolus. Two Arthrex FiberTak anchors were implanted using standard technique in the anatomic footprints of the ATFL and CFL ligaments. These were verified by manual stress to be well seated within bone. The suture needles were sequentially passed through the distal flap, tied, and then brought back proximally into the proximal flap were they were then tied.The foot was held in eversion throughout to set appropriate tension and protect the repair. Intraoperative ankle testing demonstrated improved stability of the newly reconstructed lateral ankle ligament complex.   The surgical sites were thoroughly irrigated. The tourniquet was deflated and hemostasis achieved. Betadine and vancomycin powder were applied. The deep layers were closed using 2-0 vicryl. The skin was closed without tension using 2-0 nylon suture.    The leg was cleaned with saline and sterile dressings with gauze were applied. A well padded bulky short leg splint was applied. The patient was awakened from anesthesia and transported to the recovery room in stable condition.      FOLLOW UP PLAN: -transfer to PACU, then home -strict NWB left lower extremity, maximum elevation -maintain short leg splint until follow up -WBAT right lower extremity -DVT ppx: Aspirin 81 mg twice daily while NWB -follow up as outpatient within 7-10 days for wound check with exchange of short leg splint to short leg cast -sutures out in 2-3 weeks in outpatient office     RADIOGRAPHS: None   Netta Cedars Orthopaedic Surgery [facility]

## 2023-07-11 NOTE — Anesthesia Postprocedure Evaluation (Signed)
Anesthesia Post Note  Patient: Tracey Malone  Procedure(s) Performed: ANKLE ARTHROSCOPY WITH lateral ankle stabilization Toniann Ket) (Left: Ankle) LEFT PERONEAL TENDON DEBRIDEMENT AND POSSIBLE TRANSFER (Left) STEROID INJECTION (Right: Foot)     Patient location during evaluation: PACU Anesthesia Type: Regional and General Level of consciousness: awake and alert Pain management: pain level controlled Vital Signs Assessment: post-procedure vital signs reviewed and stable Respiratory status: spontaneous breathing, nonlabored ventilation, respiratory function stable and patient connected to nasal cannula oxygen Cardiovascular status: blood pressure returned to baseline and stable Postop Assessment: no apparent nausea or vomiting Anesthetic complications: no  No notable events documented.  Last Vitals:  Vitals:   07/06/23 1245 07/06/23 1259  BP: 104/64 115/64  Pulse: 88 84  Resp: (!) 27 16  Temp:  36.6 C  SpO2: 98% 100%    Last Pain:  Vitals:   07/06/23 1259  TempSrc:   PainSc: 5                  Tracey Malone

## 2023-07-12 ENCOUNTER — Other Ambulatory Visit: Payer: Self-pay

## 2023-07-12 ENCOUNTER — Encounter: Payer: Self-pay | Admitting: Family Medicine

## 2023-07-12 DIAGNOSIS — M2550 Pain in unspecified joint: Secondary | ICD-10-CM

## 2023-07-12 DIAGNOSIS — F419 Anxiety disorder, unspecified: Secondary | ICD-10-CM

## 2023-07-12 DIAGNOSIS — G8929 Other chronic pain: Secondary | ICD-10-CM

## 2023-07-12 MED ORDER — ESCITALOPRAM OXALATE 10 MG PO TABS
10.0000 mg | ORAL_TABLET | Freq: Every day | ORAL | 1 refills | Status: DC
Start: 2023-07-12 — End: 2023-07-13

## 2023-07-13 ENCOUNTER — Other Ambulatory Visit: Payer: Self-pay | Admitting: Family Medicine

## 2023-07-13 DIAGNOSIS — F419 Anxiety disorder, unspecified: Secondary | ICD-10-CM

## 2023-07-13 DIAGNOSIS — M2550 Pain in unspecified joint: Secondary | ICD-10-CM

## 2023-07-13 DIAGNOSIS — G8929 Other chronic pain: Secondary | ICD-10-CM

## 2023-07-13 MED ORDER — METHYLPHENIDATE HCL ER (LA) 10 MG PO CP24: 10.0000 mg | ORAL_CAPSULE | Freq: Every day | ORAL | 0 refills | Status: DC

## 2023-07-13 MED ORDER — ESCITALOPRAM OXALATE 10 MG PO TABS
10.0000 mg | ORAL_TABLET | Freq: Every day | ORAL | 1 refills | Status: DC
Start: 2023-07-13 — End: 2023-10-25

## 2023-07-15 ENCOUNTER — Ambulatory Visit: Admitting: Rheumatology

## 2023-07-15 DIAGNOSIS — K760 Fatty (change of) liver, not elsewhere classified: Secondary | ICD-10-CM

## 2023-07-15 DIAGNOSIS — S93491D Sprain of other ligament of right ankle, subsequent encounter: Secondary | ICD-10-CM

## 2023-07-15 DIAGNOSIS — Z8639 Personal history of other endocrine, nutritional and metabolic disease: Secondary | ICD-10-CM

## 2023-07-15 DIAGNOSIS — R5383 Other fatigue: Secondary | ICD-10-CM

## 2023-07-15 DIAGNOSIS — E038 Other specified hypothyroidism: Secondary | ICD-10-CM

## 2023-07-15 DIAGNOSIS — M7712 Lateral epicondylitis, left elbow: Secondary | ICD-10-CM

## 2023-07-15 DIAGNOSIS — R700 Elevated erythrocyte sedimentation rate: Secondary | ICD-10-CM

## 2023-07-15 DIAGNOSIS — F900 Attention-deficit hyperactivity disorder, predominantly inattentive type: Secondary | ICD-10-CM

## 2023-07-15 DIAGNOSIS — M5030 Other cervical disc degeneration, unspecified cervical region: Secondary | ICD-10-CM

## 2023-07-15 DIAGNOSIS — R52 Pain, unspecified: Secondary | ICD-10-CM

## 2023-07-15 DIAGNOSIS — Z8659 Personal history of other mental and behavioral disorders: Secondary | ICD-10-CM

## 2023-07-15 DIAGNOSIS — G510 Bell's palsy: Secondary | ICD-10-CM

## 2023-07-15 DIAGNOSIS — Z8669 Personal history of other diseases of the nervous system and sense organs: Secondary | ICD-10-CM

## 2023-07-15 DIAGNOSIS — E559 Vitamin D deficiency, unspecified: Secondary | ICD-10-CM

## 2023-07-15 DIAGNOSIS — Z8719 Personal history of other diseases of the digestive system: Secondary | ICD-10-CM

## 2023-07-15 DIAGNOSIS — M7661 Achilles tendinitis, right leg: Secondary | ICD-10-CM

## 2023-07-15 DIAGNOSIS — M792 Neuralgia and neuritis, unspecified: Secondary | ICD-10-CM

## 2023-07-15 NOTE — Progress Notes (Deleted)
Office Visit Note  Patient: Tracey Malone             Date of Birth: Jul 25, 1976           MRN: [id number]             PCP: Donita Brooks, MD Referring: Donita Brooks, MD Visit Date: 07/29/2023 Occupation: @GUAROCC @  Subjective:  No chief complaint on file.   History of Present Illness: Tracey Malone is a 47 y.o. female ***   Patient underwent right subtalar joint injection under image guidance, left ankle arthroscopic assisted debridement and left ankle open stabilization of lateral ankle ligament complex and left ankle open debridement and tenosynovectomy of peroneal tendon by Dr. Odis Hollingshead on July 06, 2023.  Activities of Daily Living:  Patient reports morning stiffness for *** {minute/hour:19697}.   Patient {ACTIONS;DENIES/REPORTS:21021675::"Denies"} nocturnal pain.  Difficulty dressing/grooming: {ACTIONS;DENIES/REPORTS:21021675::"Denies"} Difficulty climbing stairs: {ACTIONS;DENIES/REPORTS:21021675::"Denies"} Difficulty getting out of chair: {ACTIONS;DENIES/REPORTS:21021675::"Denies"} Difficulty using hands for taps, buttons, cutlery, and/or writing: {ACTIONS;DENIES/REPORTS:21021675::"Denies"}  No Rheumatology ROS completed.   PMFS History:  Patient Active Problem List   Diagnosis Date Noted   Achilles tendinitis of right lower extremity 12/20/2022   Positive Tinel's sign 12/20/2022   Overweight (BMI 25.0-29.9) 05/27/2022   Plantar fasciitis, right 01/20/2022   Anxiety 10/27/2021   Fatty (change of) liver, not elsewhere classified 10/27/2021   Hot flashes 10/27/2021   Hyperlipidemia 10/27/2021   Leukocytosis 10/27/2021   Low libido 10/27/2021   Menopause 10/27/2021   Sleep disturbance 10/27/2021   Subclinical hypothyroidism 10/27/2021   Vaginal dryness 10/27/2021   Ganglion cyst of dorsum of right wrist 07/23/2021   Inflammatory pain 06/12/2021   Lateral epicondylitis, left elbow 02/26/2021   Pain of left forearm 02/25/2021   Injury of peroneal  tendon of right foot 03/20/2018   Contracted, tendon sheath 10/06/2017   Chronic pain of right ankle 07/25/2017   Pain in right ankle and joints of right foot 02/14/2017   Tear of talofibular ligament of right lower extremity 02/14/2017   Bell's palsy    UTI (urinary tract infection) 06/27/2013   GERD (gastroesophageal reflux disease) 06/27/2013   ADD (attention deficit disorder) 06/27/2013   Migraines     Past Medical History:  Diagnosis Date   ADD (attention deficit disorder)    Bell's palsy    Migraines     Family History  Problem Relation Age of Onset   COPD Father    Healthy Son    Healthy Daughter    Past Surgical History:  Procedure Laterality Date   ANKLE ARTHROSCOPY WITH RECONSTRUCTION Left 07/06/2023   Procedure: ANKLE ARTHROSCOPY WITH lateral ankle stabilization (Brostrom);  Surgeon: Netta Cedars, MD;  Location: [facility];  Service: Orthopedics;  Laterality: Left;  90 MIN   BREAST ENHANCEMENT SURGERY  2013   FOOT SURGERY Right 2019   ligament   GANGLION CYST EXCISION Right 2022   HEMORRHOID SURGERY     INCONTINENCE SURGERY     STERIOD INJECTION Right 07/06/2023   Procedure: STEROID INJECTION;  Surgeon: Netta Cedars, MD;  Location: [facility];  Service: Orthopedics;  Laterality: Right;   TENDON REPAIR Left 07/06/2023   Procedure: LEFT PERONEAL TENDON DEBRIDEMENT AND POSSIBLE TRANSFER;  Surgeon: Netta Cedars, MD;  Location:  SURGERY CENTER;  Service: Orthopedics;  Laterality: Left;   tommy John surgery Left 2022   TUBAL LIGATION     Social History   Social History Narrative   Not on file  Immunization History  Administered Date(s) Administered   Influenza,inj,Quad PF,6+ Mos 06/27/2013, 08/12/2014   Tdap 08/09/2013     Objective: Vital Signs: LMP 06/19/2023 (Approximate)    Physical Exam   Musculoskeletal Exam: ***  CDAI Exam: CDAI Score: -- Patient Global: --; Provider Global: -- Swollen:  --; Tender: -- Joint Exam 07/29/2023   No joint exam has been documented for this visit   There is currently no information documented on the homunculus. Go to the Rheumatology activity and complete the homunculus joint exam.  Investigation: No additional findings.  Imaging: DG MINI C-ARM IMAGE ONLY  Result Date: 07/06/2023 There is no interpretation for this exam.  This order is for images obtained during a surgical procedure.  Please See "Surgeries" Tab for more information regarding the procedure.    Recent Labs: Lab Results  Component Value Date   WBC 7.1 04/19/2023   HGB 11.7 04/19/2023   PLT 286 04/19/2023   NA 139 04/19/2023   K 4.0 04/19/2023   CL 105 04/19/2023   CO2 27 04/19/2023   GLUCOSE 75 04/19/2023   BUN 14 04/19/2023   CREATININE 0.65 04/19/2023   BILITOT 0.4 04/19/2023   ALKPHOS 71 03/30/2022   AST 16 04/19/2023   ALT 20 04/19/2023   PROT 6.8 05/11/2023   ALBUMIN 4.4 03/30/2022   CALCIUM 8.9 04/19/2023   GFRAA >60 09/21/2018   May 11, 2023 tPA nonreactive, FTA nonreactive, HSV-2 IgG positive, varicella index positive, varicella-zoster IgM antibody positive HSV-2 IgG positive, HAV IgG negative, ANCA negative, MPO negative, serum proteinase 3 negative, B12 normal, folate normal, SPEP normal, vitamin D 44, TSH normal, ESR 101  May 13, 2019 for EMG and nerve conduction velocities done by Dr. Allena Katz were normal.  Speciality Comments: No specialty comments available.  Procedures:  No procedures performed Allergies: Codeine, Topamax [topiramate], Treximet [sumatriptan-naproxen sodium], and Penicillins   Assessment / Plan:     Visit Diagnoses: Neuralgia - Burning sensation over her entire spine and both arms and legs.  Elevated sed rate - Elevated sedimentation rate since June 2024.  Diffuse pain - She has generalized burning sensation.  All autoimmune workup negative except elevated sedimentation rate.  Other fatigue - Is true fatigue since upper  respiratory tract infection May 2024.  Lateral epicondylitis, left elbow  Achilles tendinitis of right lower extremity - After an injury she developed symptoms which are gradually improving.  Tear of talofibular ligament of right lower extremity, subsequent encounter - Evaluated by Dr. Romualdo Bolk in [facility] 2019.  She has intermittent discomfort.  DDD (degenerative disc disease), cervical - Mild degenerative changes were noted on the previous x-rays.  Bell's palsy - 2012.  Vitamin D deficiency  Subclinical hypothyroidism  History of hyperlipidemia  Fatty liver  History of gastroesophageal reflux (GERD)  Hx of migraines  History of anxiety  Attention deficit hyperactivity disorder (ADHD), predominantly inattentive type  Orders: No orders of the defined types were placed in this encounter.  No orders of the defined types were placed in this encounter.   Face-to-face time spent with patient was *** minutes. Greater than 50% of time was spent in counseling and coordination of care.  Follow-Up Instructions: No follow-ups on file.   Pollyann Savoy, MD  Note - This record has been created using Animal nutritionist.  Chart creation errors have been sought, but may not always  have been located. Such creation errors do not reflect on  the standard of medical care.

## 2023-07-28 NOTE — Plan of Care (Signed)
[facility] Tonsillectomy/Adenoidectomy, Postoperative PEDS care plan entered in error.

## 2023-07-29 ENCOUNTER — Ambulatory Visit: Admitting: Rheumatology

## 2023-07-29 DIAGNOSIS — S93491D Sprain of other ligament of right ankle, subsequent encounter: Secondary | ICD-10-CM

## 2023-07-29 DIAGNOSIS — R5383 Other fatigue: Secondary | ICD-10-CM

## 2023-07-29 DIAGNOSIS — K760 Fatty (change of) liver, not elsewhere classified: Secondary | ICD-10-CM

## 2023-07-29 DIAGNOSIS — E559 Vitamin D deficiency, unspecified: Secondary | ICD-10-CM

## 2023-07-29 DIAGNOSIS — M5030 Other cervical disc degeneration, unspecified cervical region: Secondary | ICD-10-CM

## 2023-07-29 DIAGNOSIS — R52 Pain, unspecified: Secondary | ICD-10-CM

## 2023-07-29 DIAGNOSIS — F900 Attention-deficit hyperactivity disorder, predominantly inattentive type: Secondary | ICD-10-CM

## 2023-07-29 DIAGNOSIS — Z8719 Personal history of other diseases of the digestive system: Secondary | ICD-10-CM

## 2023-07-29 DIAGNOSIS — M7712 Lateral epicondylitis, left elbow: Secondary | ICD-10-CM

## 2023-07-29 DIAGNOSIS — M792 Neuralgia and neuritis, unspecified: Secondary | ICD-10-CM

## 2023-07-29 DIAGNOSIS — Z8659 Personal history of other mental and behavioral disorders: Secondary | ICD-10-CM

## 2023-07-29 DIAGNOSIS — G510 Bell's palsy: Secondary | ICD-10-CM

## 2023-07-29 DIAGNOSIS — E038 Other specified hypothyroidism: Secondary | ICD-10-CM

## 2023-07-29 DIAGNOSIS — Z8669 Personal history of other diseases of the nervous system and sense organs: Secondary | ICD-10-CM

## 2023-07-29 DIAGNOSIS — M7661 Achilles tendinitis, right leg: Secondary | ICD-10-CM

## 2023-07-29 DIAGNOSIS — R700 Elevated erythrocyte sedimentation rate: Secondary | ICD-10-CM

## 2023-07-29 DIAGNOSIS — Z8639 Personal history of other endocrine, nutritional and metabolic disease: Secondary | ICD-10-CM

## 2023-09-18 ENCOUNTER — Other Ambulatory Visit: Payer: Self-pay | Admitting: Family Medicine

## 2023-09-19 MED ORDER — ALPRAZOLAM 0.5 MG PO TABS: 0.5000 mg | ORAL_TABLET | Freq: Three times a day (TID) | ORAL | 0 refills | Status: DC | PRN

## 2023-09-19 MED ORDER — METHYLPHENIDATE HCL ER (LA) 10 MG PO CP24: 10.0000 mg | ORAL_CAPSULE | Freq: Every day | ORAL | 0 refills | Status: DC

## 2023-09-23 ENCOUNTER — Other Ambulatory Visit

## 2023-09-23 DIAGNOSIS — R52 Pain, unspecified: Secondary | ICD-10-CM

## 2023-09-23 DIAGNOSIS — E7800 Pure hypercholesterolemia, unspecified: Secondary | ICD-10-CM

## 2023-09-23 DIAGNOSIS — D72829 Elevated white blood cell count, unspecified: Secondary | ICD-10-CM

## 2023-09-23 DIAGNOSIS — R7989 Other specified abnormal findings of blood chemistry: Secondary | ICD-10-CM

## 2023-09-24 LAB — CBC WITH DIFFERENTIAL/PLATELET
Absolute Lymphocytes: 2350 {cells}/uL (ref 850–3900)
Absolute Monocytes: 570 {cells}/uL (ref 200–950)
Basophils Absolute: 27 {cells}/uL (ref 0–200)
Basophils Relative: 0.3 %
Eosinophils Absolute: 62 {cells}/uL (ref 15–500)
Eosinophils Relative: 0.7 %
HCT: 39.5 % (ref 35.0–45.0)
Hemoglobin: 12.9 g/dL (ref 11.7–15.5)
MCH: 30.4 pg (ref 27.0–33.0)
MCHC: 32.7 g/dL (ref 32.0–36.0)
MCV: 93.2 fL (ref 80.0–100.0)
MPV: 9 fL (ref 7.5–12.5)
Monocytes Relative: 6.4 %
Neutro Abs: 5892 {cells}/uL (ref 1500–7800)
Neutrophils Relative %: 66.2 %
Platelets: 410 10*3/uL — ABNORMAL HIGH (ref 140–400)
RBC: 4.24 10*6/uL (ref 3.80–5.10)
RDW: 12.8 % (ref 11.0–15.0)
Total Lymphocyte: 26.4 %
WBC: 8.9 10*3/uL (ref 3.8–10.8)

## 2023-09-24 LAB — COMPLETE METABOLIC PANEL WITH GFR
AG Ratio: 1.9 (calc) (ref 1.0–2.5)
ALT: 33 U/L — ABNORMAL HIGH (ref 6–29)
AST: 21 U/L (ref 10–35)
Albumin: 4.5 g/dL (ref 3.6–5.1)
Alkaline phosphatase (APISO): 100 U/L (ref 31–125)
BUN: 13 mg/dL (ref 7–25)
CO2: 28 mmol/L (ref 20–32)
Calcium: 9.3 mg/dL (ref 8.6–10.2)
Chloride: 102 mmol/L (ref 98–110)
Creat: 0.71 mg/dL (ref 0.50–0.99)
Globulin: 2.4 g/dL (ref 1.9–3.7)
Glucose, Bld: 77 mg/dL (ref 65–99)
Potassium: 4.1 mmol/L (ref 3.5–5.3)
Sodium: 139 mmol/L (ref 135–146)
Total Bilirubin: 0.5 mg/dL (ref 0.2–1.2)
Total Protein: 6.9 g/dL (ref 6.1–8.1)
eGFR: 104 mL/min/{1.73_m2} (ref >=60)

## 2023-09-24 LAB — LIPID PANEL
Cholesterol: 211 mg/dL — ABNORMAL HIGH (ref <200)
HDL: 70 mg/dL (ref >=50)
LDL Cholesterol (Calc): 122 mg/dL — ABNORMAL HIGH
Non-HDL Cholesterol (Calc): 141 mg/dL — ABNORMAL HIGH (ref <130)
Total CHOL/HDL Ratio: 3 (calc) (ref <5.0)
Triglycerides: 88 mg/dL (ref <150)

## 2023-10-12 ENCOUNTER — Telehealth: Payer: Self-pay | Admitting: Family Medicine

## 2023-10-12 NOTE — Telephone Encounter (Signed)
 Copied from CRM [id number]. Topic: General - Call Back - No Documentation >> Oct 11, 2023 10:05 AM Halina Maidens L wrote: Reason for CRM: Patient want a callback from Nurse about her blood work

## 2023-10-20 NOTE — Progress Notes (Signed)
Office Visit Note  Patient: Tracey Malone             Date of Birth: May 04, 1976           MRN: [id number]             PCP: Donita Brooks, MD Referring: Donita Brooks, MD Visit Date: 11/03/2023 Occupation: @GUAROCC @  Subjective:  Pain in entire spine  History of Present Illness: Tracey Malone is a 48 y.o. female with history of generalized pain and elevated sedimentation rate.  She returns today after her last visit in August 2024.  Patient states she continues to have generalized pain and discomfort.  She has tried different medications without any improvement.  On May 13, 2023 nerve conduction velocity and EMG studies normal of upper extremities done by Dr. Allena Katz.  Patient states after that she went for some massage therapy which was helpful and relieved a lot of her discomfort.  But then pain recurred soon.  She denies any history of joint pain or joint swelling.  There is no history of oral ulcers, nasal ulcers, malar rash, full sensitivity, Raynaud's or lymphadenopathy. On July 06, 2023 patient had right subtalar joint injection, left ankle arthroscopic assisted debridement, open stabilization of lateral ankle ligament complex, open debridement and tenosynovectomy of peroneal tendons.  Patient is gradually recovering from it.    Activities of Daily Living:  Patient reports morning stiffness for 0 minutes.   Patient Reports nocturnal pain.  Difficulty dressing/grooming: Denies Difficulty climbing stairs: Denies Difficulty getting out of chair: Denies Difficulty using hands for taps, buttons, cutlery, and/or writing: Denies  Review of Systems  Constitutional:  Positive for fatigue.  HENT:  Negative for mouth sores and mouth dryness.   Eyes:  Negative for dryness.  Respiratory:  Negative for shortness of breath.   Cardiovascular:  Negative for chest pain and palpitations.  Gastrointestinal:  Negative for blood in stool, constipation and diarrhea.  Endocrine:  Negative for increased urination.  Genitourinary:  Negative for involuntary urination.  Musculoskeletal:  Positive for myalgias and myalgias. Negative for joint pain, gait problem, joint pain, joint swelling, muscle weakness, morning stiffness and muscle tenderness.  Skin:  Negative for color change, rash, hair loss and sensitivity to sunlight.  Allergic/Immunologic: Negative for susceptible to infections.  Neurological:  Negative for dizziness and headaches.  Hematological:  Negative for swollen glands.  Psychiatric/Behavioral:  Positive for sleep disturbance. Negative for depressed mood. The patient is not nervous/anxious.     PMFS History:  Patient Active Problem List   Diagnosis Date Noted   Achilles tendinitis of right lower extremity 12/20/2022   Positive Tinel's sign 12/20/2022   Overweight (BMI 25.0-29.9) 05/27/2022   Plantar fasciitis, right 01/20/2022   Anxiety 10/27/2021   Fatty (change of) liver, not elsewhere classified 10/27/2021   Hot flashes 10/27/2021   Hyperlipidemia 10/27/2021   Leukocytosis 10/27/2021   Low libido 10/27/2021   Menopause 10/27/2021   Sleep disturbance 10/27/2021   Subclinical hypothyroidism 10/27/2021   Vaginal dryness 10/27/2021   Ganglion cyst of dorsum of right wrist 07/23/2021   Inflammatory pain 06/12/2021   Lateral epicondylitis, left elbow 02/26/2021   Pain of left forearm 02/25/2021   Injury of peroneal tendon of right foot 03/20/2018   Contracted, tendon sheath 10/06/2017   Chronic pain of right ankle 07/25/2017   Pain in right ankle and joints of right foot 02/14/2017   Tear of talofibular ligament of right lower extremity 02/14/2017   Bell's palsy  UTI (urinary tract infection) 06/27/2013   GERD (gastroesophageal reflux disease) 06/27/2013   ADD (attention deficit disorder) 06/27/2013   Migraines     Past Medical History:  Diagnosis Date   ADD (attention deficit disorder)    Bell's palsy    Migraines     Family History   Problem Relation Age of Onset   COPD Father    Healthy Son    Healthy Daughter    Past Surgical History:  Procedure Laterality Date   ANKLE ARTHROSCOPY WITH RECONSTRUCTION Left 07/06/2023   Procedure: ANKLE ARTHROSCOPY WITH lateral ankle stabilization (Brostrom);  Surgeon: Netta Cedars, MD;  Location: [facility];  Service: Orthopedics;  Laterality: Left;  90 MIN   BREAST ENHANCEMENT SURGERY  2013   FOOT SURGERY Right 2019   ligament   GANGLION CYST EXCISION Right 2022   HEMORRHOID SURGERY     INCONTINENCE SURGERY     STERIOD INJECTION Right 07/06/2023   Procedure: STEROID INJECTION;  Surgeon: Netta Cedars, MD;  Location: [facility];  Service: Orthopedics;  Laterality: Right;   TENDON REPAIR Left 07/06/2023   Procedure: LEFT PERONEAL TENDON DEBRIDEMENT AND POSSIBLE TRANSFER;  Surgeon: Netta Cedars, MD;  Location: [facility];  Service: Orthopedics;  Laterality: Left;   tommy John surgery Left 2022   TUBAL LIGATION     Social History   Social History Narrative   Not on file   Immunization History  Administered Date(s) Administered   Influenza,inj,Quad PF,6+ Mos 06/27/2013, 08/12/2014   Tdap 08/09/2013     Objective: Vital Signs: BP 115/75 (BP Location: Left Arm, Patient Position: Sitting, Cuff Size: Normal)   Pulse 91   Resp 14   Ht 5\' 5"  (1.651 m)   Wt 148 lb 9.6 oz (67.4 kg)   BMI 24.73 kg/m    Physical Exam Vitals and nursing note reviewed.  Constitutional:      Appearance: She is well-developed.  HENT:     Head: Normocephalic and atraumatic.  Eyes:     Conjunctiva/sclera: Conjunctivae normal.  Cardiovascular:     Rate and Rhythm: Normal rate and regular rhythm.     Heart sounds: Normal heart sounds.  Pulmonary:     Effort: Pulmonary effort is normal.     Breath sounds: Normal breath sounds.  Abdominal:     General: Bowel sounds are normal.     Palpations: Abdomen is soft.  Musculoskeletal:      Cervical back: Normal range of motion.  Lymphadenopathy:     Cervical: No cervical adenopathy.  Skin:    General: Skin is warm and dry.     Capillary Refill: Capillary refill takes less than 2 seconds.  Neurological:     Mental Status: She is alert and oriented to person, place, and time.  Psychiatric:        Behavior: Behavior normal.      Musculoskeletal Exam: Cervical, thoracic and lumbar spine 1 good range of motion without any point tenderness.  There was no SI joint tenderness.  Shoulders, elbows, wrists were in good range of motion without any synovitis.  MCPs, PIPs and DIPs Juengel range of motion with no synovitis.  Hip joints and knee joints were in good range of motion without any warmth swelling or effusion.  There was no tenderness over ankles or MTPs.  CDAI Exam: CDAI Score: -- Patient Global: --; Provider Global: -- Swollen: --; Tender: -- Joint Exam 11/03/2023   No joint exam has been documented for this  visit   There is currently no information documented on the homunculus. Go to the Rheumatology activity and complete the homunculus joint exam.  Investigation: No additional findings.  Imaging: No results found.  Recent Labs: Lab Results  Component Value Date   WBC 8.9 09/23/2023   HGB 12.9 09/23/2023   PLT 410 (H) 09/23/2023   NA 139 09/23/2023   K 4.1 09/23/2023   CL 102 09/23/2023   CO2 28 09/23/2023   GLUCOSE 77 09/23/2023   BUN 13 09/23/2023   CREATININE 0.71 09/23/2023   BILITOT 0.5 09/23/2023   ALKPHOS 71 03/30/2022   AST 21 09/23/2023   ALT 33 (H) 09/23/2023   PROT 6.9 09/23/2023   ALBUMIN 4.4 03/30/2022   CALCIUM 9.3 09/23/2023   GFRAA >60 09/21/2018   May 11, 2023 SPEP normal, EBV IgG positive, HSV 2 IgG positive, varicella-zoster IgM positive, T. pallidum nonreactive, FTA nonreactive, ESR 101, TSH normal, vitamin D 44, B12> 2000, folate 10, ANCA negative, MPO, serum proteinase 3 negative  04/04/23: Lyme disease-, B burgdorferi-, RMSF-,  ESR 77, ANA negative, RF negative, CK WNL   03/24/23: ESR 103, CRP WNL. 04/04/23: ESR 77. 04/19/23: ESR 79. 04/29/23: ESR 106, CRP WNL   Speciality Comments: No specialty comments available.  Procedures:  No procedures performed Allergies: Codeine, Topamax [topiramate], Treximet [sumatriptan-naproxen sodium], and Penicillins   Assessment / Plan:     Visit Diagnoses: Diffuse pain -she continues to have generalized pain which started after upper respiratory tract infection May 2024.  There is no joint pain or joint swelling.  No synovitis was noted on the examination.  There was no muscular weakness on exam.  Patient was able to get up from a squatting position without any difficulty.  Her CK has been normal in the past.  EMG nerve conduction velocities done by Dr. Allena Katz were unremarkable.  Neuralgia - Burning sensation in the entire spine, over her arms and her legs.  Patient has an appointment coming up with neurologist.  Labs obtained at the last visit were reviewed with the patient which were unremarkable except for the elevated sedimentation rate.  Other fatigue-she continues to have fatigue.  TSH and SPEP were normal.  Elevated sed rate - Sed rate remains elevated.  Lateral epicondylitis, left elbow - Intermittent pain.  He was asymptomatic today.  Achilles tendinitis of right lower extremity - Related to injury  Tear of talofibular ligament of right lower extremity, subsequent encounter-she is intermittent discomfort and is followed by podiatrist.  She also had surgery on her left ankle.  DDD (degenerative disc disease), cervical - Mild degenerative changes were noted on the x-rays obtained previously.  She had good range of motion.  Other medical problems listed as follows:  Fatty liver  Vitamin D deficiency  Bell's palsy - 2012  Hx of migraines  History of anxiety  Attention deficit hyperactivity disorder (ADHD), predominantly inattentive type  History of  hyperlipidemia  Subclinical hypothyroidism  History of gastroesophageal reflux (GERD)  Orders: No orders of the defined types were placed in this encounter.  No orders of the defined types were placed in this encounter.    Follow-Up Instructions: Return if symptoms worsen or fail to improve, for Neuralgia.   Pollyann Savoy, MD  Note - This record has been created using Animal nutritionist.  Chart creation errors have been sought, but may not always  have been located. Such creation errors do not reflect on  the standard of medical care.

## 2023-10-25 ENCOUNTER — Ambulatory Visit: Admitting: Family Medicine

## 2023-10-25 ENCOUNTER — Encounter: Payer: Self-pay | Admitting: Family Medicine

## 2023-10-25 VITALS — BP 128/82 | HR 85 | Temp 97.9°F | Ht 65.0 in | Wt 148.0 lb

## 2023-10-25 DIAGNOSIS — R208 Other disturbances of skin sensation: Secondary | ICD-10-CM

## 2023-10-25 MED ORDER — DULOXETINE HCL 60 MG PO CPEP: 60.0000 mg | ORAL_CAPSULE | Freq: Every day | ORAL | 3 refills | Status: DC

## 2023-10-25 NOTE — Progress Notes (Signed)
Subjective:    Patient ID: Tracey Malone, female    DOB: [birth year], 48 y.o.   MRN: [id number] Last summer, I saw the patient's several times for nerve pain.  The patient was having pain radiating up and down her spine from her neck to her tailbone.  She was also having pain radiating down her left arm.  She was having hyperesthesias and allodynia.  Lab work was normal except for an elevated sedimentation rate.  Please see previous office visits that outlined workup further.  CT scan of the cervical spine revealed no nerve impingement.  Nerve conduction studies of the upper extremities were normal.  Patient was tried on gabapentin but this caused worsening hyperesthesias she discontinued the medication.  I referred her to rheumatology due to the elevated sedimentation rate.  They recommended trying Lyrica which did not help her pain and caused confusion.  Patient states that the pain gradually got better over the fall however the pain has now returned.  She complains of hyperesthesias and allodynia in her neck up and down her spine as well as in her left arm.  She also reports worsening restless leg symptoms in both legs.  She states that she cannot sit still due to aching pain in both legs.  She has to frequently stand up and walk and stretch her legs. Past Medical History:  Diagnosis Date   ADD (attention deficit disorder)    Bell's palsy    Migraines    Past Surgical History:  Procedure Laterality Date   ANKLE ARTHROSCOPY WITH RECONSTRUCTION Left 07/06/2023   Procedure: ANKLE ARTHROSCOPY WITH lateral ankle stabilization (Brostrom);  Surgeon: Netta Cedars, MD;  Location: [facility];  Service: Orthopedics;  Laterality: Left;  90 MIN   BREAST ENHANCEMENT SURGERY  2013   FOOT SURGERY Right 2019   ligament   GANGLION CYST EXCISION Right 2022   HEMORRHOID SURGERY     INCONTINENCE SURGERY     STERIOD INJECTION Right 07/06/2023   Procedure: STEROID INJECTION;  Surgeon:  Netta Cedars, MD;  Location: [facility];  Service: Orthopedics;  Laterality: Right;   TENDON REPAIR Left 07/06/2023   Procedure: LEFT PERONEAL TENDON DEBRIDEMENT AND POSSIBLE TRANSFER;  Surgeon: Netta Cedars, MD;  Location:  SURGERY CENTER;  Service: Orthopedics;  Laterality: Left;   tommy John surgery Left 2022   TUBAL LIGATION     Current Outpatient Medications on File Prior to Visit  Medication Sig Dispense Refill   ALPRAZolam (XANAX) 0.5 MG tablet Take 1 tablet (0.5 mg total) by mouth 3 (three) times daily as needed for anxiety. 20 tablet 0   cyanocobalamin (,VITAMIN B-12,) 1000 MCG/ML injection every 30 (thirty) days.     escitalopram (LEXAPRO) 10 MG tablet Take 1 tablet (10 mg total) by mouth daily. 90 tablet 1   fluticasone (FLONASE) 50 MCG/ACT nasal spray Place 2 sprays into both nostrils daily. 16 g 0   methylphenidate (RITALIN LA) 10 MG 24 hr capsule Take 1 capsule (10 mg total) by mouth daily. 30 capsule 0   pantoprazole (PROTONIX) 40 MG tablet TAKE 1 TABLET(40 MG) BY MOUTH DAILY (Patient taking differently: as needed.) 30 tablet 3   No current facility-administered medications on file prior to visit.   Allergies  Allergen Reactions   Codeine Shortness Of Breath   Topamax [Topiramate]     Cognitive Slowing--types same word over and over. Cannot get out correct words when talking.   Treximet [Sumatriptan-Naproxen Sodium] Shortness Of Breath and  Swelling    Throat gets tight.   Penicillins Swelling    Swelling unknown  Has patient had a PCN reaction causing immediate rash, facial/tongue/throat swelling, SOB or lightheadedness with hypotension: Unknown Has patient had a PCN reaction causing severe rash involving mucus membranes or skin necrosis: No  Has patient had a PCN reaction that required hospitalization: No  Has patient had a PCN reaction occurring within the last 10 years: No  If all of the above answers are "NO", then may proceed with  Cep   Social History   Socioeconomic History   Marital status: Married    Spouse name: Not on file   Number of children: Not on file   Years of education: Not on file   Highest education level: Not on file  Occupational History   Not on file  Tobacco Use   Smoking status: Former    Current packs/day: 0.00    Average packs/day: 1 pack/day for 21.0 years (21.0 ttl pk-yrs)    Types: Cigarettes    Start date: 91    Quit date: 2012    Years since quitting: 13.0    Passive exposure: Never   Smokeless tobacco: Never  Vaping Use   Vaping status: Never Used  Substance and Sexual Activity   Alcohol use: Yes    Comment: ocasional   Drug use: No   Sexual activity: Yes    Birth control/protection: Surgical    Comment: Married to [employer], two kids.  Retired from [employer].  Other Topics Concern   Not on file  Social History Narrative   Not on file   Social Drivers of Health   Financial Resource Strain: Not on file  Food Insecurity: Not on file  Transportation Needs: Not on file  Physical Activity: Not on file  Stress: Not on file  Social Connections: Not on file  Intimate Partner Violence: Not on file   Family History  Problem Relation Age of Onset   COPD Father    Healthy Son    Healthy Daughter       Review of Systems  All other systems reviewed and are negative.      Objective:   Physical Exam Vitals reviewed.  Constitutional:      General: She is not in acute distress.    Appearance: She is well-developed. She is not diaphoretic.  HENT:     Head: Normocephalic and atraumatic.     Right Ear: External ear normal.     Left Ear: External ear normal.     Nose: Nose normal.     Mouth/Throat:     Pharynx: No oropharyngeal exudate.  Eyes:     General: No scleral icterus.       Right eye: No discharge.        Left eye: No discharge.     Conjunctiva/sclera: Conjunctivae normal.     Pupils: Pupils are equal, round, and reactive to light.  Neck:     Thyroid: No  thyromegaly.     Vascular: No JVD.     Trachea: No tracheal deviation.  Cardiovascular:     Rate and Rhythm: Normal rate and regular rhythm.     Heart sounds: Normal heart sounds. No murmur heard.    No friction rub. No gallop.  Pulmonary:     Effort: Pulmonary effort is normal. No respiratory distress.     Breath sounds: Normal breath sounds. No wheezing or rales.  Chest:     Chest wall: No tenderness.  Abdominal:     General: Bowel sounds are normal. There is no distension.     Palpations: Abdomen is soft. There is no mass.     Tenderness: There is no abdominal tenderness. There is no guarding or rebound.  Musculoskeletal:        General: No tenderness. Normal range of motion.     Cervical back: Normal range of motion and neck supple.       Back:  Lymphadenopathy:     Cervical: No cervical adenopathy.  Skin:    General: Skin is warm.     Coloration: Skin is not pale.     Findings: No erythema or rash.  Neurological:     Mental Status: She is alert and oriented to person, place, and time.     Cranial Nerves: No cranial nerve deficit.     Motor: No abnormal muscle tone.     Coordination: Coordination normal.     Deep Tendon Reflexes: Reflexes are normal and symmetric.  Psychiatric:        Behavior: Behavior normal.        Thought Content: Thought content normal.        Judgment: Judgment normal.           Assessment & Plan:  Allodynia - Plan: Sedimentation rate Aside from the elevated sedimentation rate in the past, I felt the patient's pain was likely due to fibromyalgia.  She has tried and failed gabapentin and Lyrica.  She is also tried and failed Topamax in the past.  Therefore I hesitate to use any type of seizure medication to control nerve pain.  I recommended stopping Lexapro and switching to Cymbalta 60 mg a day to see if this will help with nerve pain/fibromyalgia.  Repeat a sedimentation rate.  If significantly elevated, and rheumatology is unable to find an  explanation, I would recommend a referral to neurology to determine if there is any other potential causes of severe hyperesthesias and nerve pain that I have not evaluated the patient for.

## 2023-10-26 LAB — SEDIMENTATION RATE: Sed Rate: 75 mm/h — ABNORMAL HIGH (ref 0–20)

## 2023-10-27 ENCOUNTER — Other Ambulatory Visit: Payer: Self-pay | Admitting: Family Medicine

## 2023-10-27 DIAGNOSIS — R208 Other disturbances of skin sensation: Secondary | ICD-10-CM

## 2023-11-03 ENCOUNTER — Ambulatory Visit: Attending: Rheumatology | Admitting: Rheumatology

## 2023-11-03 ENCOUNTER — Encounter: Payer: Self-pay | Admitting: Neurology

## 2023-11-03 ENCOUNTER — Encounter: Payer: Self-pay | Admitting: Rheumatology

## 2023-11-03 VITALS — BP 115/75 | HR 91 | Resp 14 | Ht 65.0 in | Wt 148.6 lb

## 2023-11-03 DIAGNOSIS — Z8719 Personal history of other diseases of the digestive system: Secondary | ICD-10-CM

## 2023-11-03 DIAGNOSIS — F900 Attention-deficit hyperactivity disorder, predominantly inattentive type: Secondary | ICD-10-CM

## 2023-11-03 DIAGNOSIS — G510 Bell's palsy: Secondary | ICD-10-CM

## 2023-11-03 DIAGNOSIS — M792 Neuralgia and neuritis, unspecified: Secondary | ICD-10-CM | POA: Diagnosis not present

## 2023-11-03 DIAGNOSIS — Z8639 Personal history of other endocrine, nutritional and metabolic disease: Secondary | ICD-10-CM

## 2023-11-03 DIAGNOSIS — R5383 Other fatigue: Secondary | ICD-10-CM | POA: Diagnosis not present

## 2023-11-03 DIAGNOSIS — R700 Elevated erythrocyte sedimentation rate: Secondary | ICD-10-CM

## 2023-11-03 DIAGNOSIS — Z8659 Personal history of other mental and behavioral disorders: Secondary | ICD-10-CM

## 2023-11-03 DIAGNOSIS — Z8669 Personal history of other diseases of the nervous system and sense organs: Secondary | ICD-10-CM

## 2023-11-03 DIAGNOSIS — M7661 Achilles tendinitis, right leg: Secondary | ICD-10-CM

## 2023-11-03 DIAGNOSIS — R52 Pain, unspecified: Secondary | ICD-10-CM | POA: Diagnosis not present

## 2023-11-03 DIAGNOSIS — K760 Fatty (change of) liver, not elsewhere classified: Secondary | ICD-10-CM

## 2023-11-03 DIAGNOSIS — M5030 Other cervical disc degeneration, unspecified cervical region: Secondary | ICD-10-CM

## 2023-11-03 DIAGNOSIS — M7712 Lateral epicondylitis, left elbow: Secondary | ICD-10-CM

## 2023-11-03 DIAGNOSIS — S93491D Sprain of other ligament of right ankle, subsequent encounter: Secondary | ICD-10-CM

## 2023-11-03 DIAGNOSIS — E038 Other specified hypothyroidism: Secondary | ICD-10-CM

## 2023-11-03 DIAGNOSIS — E559 Vitamin D deficiency, unspecified: Secondary | ICD-10-CM

## 2023-12-12 ENCOUNTER — Encounter: Payer: Self-pay | Admitting: Neurology

## 2023-12-12 ENCOUNTER — Ambulatory Visit (INDEPENDENT_AMBULATORY_CARE_PROVIDER_SITE_OTHER): Admitting: Neurology

## 2023-12-12 ENCOUNTER — Other Ambulatory Visit: Payer: Self-pay | Admitting: Physician Assistant

## 2023-12-12 ENCOUNTER — Other Ambulatory Visit: Payer: Self-pay | Admitting: Family Medicine

## 2023-12-12 VITALS — BP 120/78 | HR 73 | Ht 65.0 in | Wt 149.0 lb

## 2023-12-12 DIAGNOSIS — B9689 Other specified bacterial agents as the cause of diseases classified elsewhere: Secondary | ICD-10-CM

## 2023-12-12 DIAGNOSIS — R208 Other disturbances of skin sensation: Secondary | ICD-10-CM | POA: Diagnosis not present

## 2023-12-12 MED ORDER — FLUTICASONE PROPIONATE 50 MCG/ACT NA SUSP: 2.0000 | Freq: Every day | NASAL | 0 refills | Status: DC

## 2023-12-12 NOTE — Progress Notes (Signed)
 [facility] Clinic Note - Initial Visit   Date: 12/12/2023   Tracey Malone MRN: [id number] DOB: 1976/10/02   Dear Dr. Tanya Nones:  Thank you for your kind referral of Tracey Malone for consultation of pain. Although her history is well known to you, please allow [place] to reiterate it for the purpose of our medical record. The patient was accompanied to the clinic by husband who also provides collateral information.     Tracey Malone is a 48 y.o. right-handed female with right Bell's palsy (2014), migraines, and anxiety presenting for evaluation of diffuse pain.   IMPRESSION/PLAN: Diffuse pain and dysesthesia involving the anterior arms, anterior legs, and entire back.  Symptoms do not confirm to a nerve distribution or sensory dermatome. Neurological exam is normal and prior NCS/EMG of the arms and CT cervical spine was also unrevealing.  Labs including TSH, vitamin B12, and folate is normal.  She had had extensive testing without clear diagnosis for her symptoms. Based on her exam and testing thus far, I do not see a primary neurological condition to explain her pain.  Fortunately, she is having relief with Cymbalta 60mg  daily, which can be titrated as needed by PCP. From a neurological standpoint, I do not have any additional testing to offer her.   ------------------------------------------------------------- History of present illness: In May 2024, she was leaning forward to get a ball and hit her head against her 1-month old Clinical cytogeneticist. Since this time, she has pain burning sensation involving the entire back, tops of her upper arms, and front of the thighs.  Pain does not extend into the hands, back of the arms, feet, or back of the thighs. Pain occurs in the evening about 4 times per week and lasts all evening.  Cold and stress intensifies her pain.  She has some relief with warm water.  She went to see a chricopractor and massage which helped transiently.   She was started on Cymbalta 60mg  which helps significantly, however, it does not completely alleviate evening symptoms.   She works as a Psychiatric nurse.  Nonsmoker.  She drinks alcohol 3-4 days per week, usually 3 drinks in a sitting.   Out-side paper records, electronic medical record, and images have been reviewed where available and summarized as:  NCS/EMG of the arms 05/13/2023: This is a normal study of the upper extremities.  In particular, there is no evidence of carpal tunnel syndrome, ulnar neuropathy, sensorimotor polyneuropathy, or a cervical radiculopathy.   CT cervical spine 04/22/2023: Mild/early degenerative changes.  No acute osseous abnormalities.    No results found for: "HGBA1C" Lab Results  Component Value Date   VITAMINB12 >2,000 (H) 05/11/2023   Lab Results  Component Value Date   TSH 2.02 05/11/2023   Lab Results  Component Value Date   ESRSEDRATE 75 (H) 10/25/2023   Lab Results  Component Value Date   FOLATE 10.0 05/11/2023     Past Medical History:  Diagnosis Date   ADD (attention deficit disorder)    Bell's palsy    Migraines     Past Surgical History:  Procedure Laterality Date   ANKLE ARTHROSCOPY WITH RECONSTRUCTION Left 07/06/2023   Procedure: ANKLE ARTHROSCOPY WITH lateral ankle stabilization (Brostrom);  Surgeon: Netta Cedars, MD;  Location: [facility];  Service: Orthopedics;  Laterality: Left;  90 MIN   BREAST ENHANCEMENT SURGERY  2013   FOOT SURGERY Right 2019   ligament   GANGLION CYST EXCISION  Right 2022   HEMORRHOID SURGERY     INCONTINENCE SURGERY     STERIOD INJECTION Right 07/06/2023   Procedure: STEROID INJECTION;  Surgeon: Netta Cedars, MD;  Location: [facility];  Service: Orthopedics;  Laterality: Right;   TENDON REPAIR Left 07/06/2023   Procedure: LEFT PERONEAL TENDON DEBRIDEMENT AND POSSIBLE TRANSFER;  Surgeon: Netta Cedars, MD;  Location: [facility];  Service: Orthopedics;  Laterality: Left;   tommy John surgery Left 2022   TUBAL LIGATION       Medications:  Outpatient Encounter Medications as of 12/12/2023  Medication Sig   ALPRAZolam (XANAX) 0.5 MG tablet Take 1 tablet (0.5 mg total) by mouth 3 (three) times daily as needed for anxiety.   cyanocobalamin (,VITAMIN B-12,) 1000 MCG/ML injection every 30 (thirty) days.   DULoxetine (CYMBALTA) 60 MG capsule Take 1 capsule (60 mg total) by mouth daily.   methylphenidate (RITALIN LA) 10 MG 24 hr capsule Take 1 capsule (10 mg total) by mouth daily.   fluticasone (FLONASE) 50 MCG/ACT nasal spray Place 2 sprays into both nostrils daily. (Patient not taking: Reported on 12/12/2023)   pantoprazole (PROTONIX) 40 MG tablet TAKE 1 TABLET(40 MG) BY MOUTH DAILY (Patient not taking: Reported on 12/12/2023)   No facility-administered encounter medications on file as of 12/12/2023.    Allergies:  Allergies  Allergen Reactions   Codeine Shortness Of Breath   Topamax [Topiramate]     Cognitive Slowing--types same word over and over. Cannot get out correct words when talking.   Treximet [Sumatriptan-Naproxen Sodium] Shortness Of Breath and Swelling    Throat gets tight.   Penicillins Swelling    Swelling unknown  Has patient had a PCN reaction causing immediate rash, facial/tongue/throat swelling, SOB or lightheadedness with hypotension: Unknown Has patient had a PCN reaction causing severe rash involving mucus membranes or skin necrosis: No  Has patient had a PCN reaction that required hospitalization: No  Has patient had a PCN reaction occurring within the last 10 years: No  If all of the above answers are "NO", then may proceed with Cep    Family History: Family History  Problem Relation Age of Onset   COPD Father    Healthy Son    Healthy Daughter     Social History: Social History   Tobacco Use   Smoking status: Former    Current packs/day: 0.00    Average packs/day:  1 pack/day for 21.0 years (21.0 ttl pk-yrs)    Types: Cigarettes    Start date: 7    Quit date: 2012    Years since quitting: 13.1    Passive exposure: Never   Smokeless tobacco: Never  Vaping Use   Vaping status: Never Used  Substance Use Topics   Alcohol use: Yes    Comment: occasional   Drug use: No   Social History   Social History Narrative   Are you right handed or left handed? Right Handed   Are you currently employed ? 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 No    Who lives with you? Husband    What type of home do you live in: 1 story or 2 story? Lives in a 3 story home        Vital Signs:  BP 120/78   Pulse 73   Ht 5\' 5"  (1.651 m)   Wt 149 lb (67.6 kg)   SpO2  97%   BMI 24.79 kg/m   Neurological Exam: MENTAL STATUS including orientation to time, place, person, recent and remote memory, attention span and concentration, language, and fund of knowledge is normal.  Speech is not dysarthric.  CRANIAL NERVES: II:  No visual field defects.     III-IV-VI: Pupils equal round and reactive to light.  Normal conjugate, extra-ocular eye movements in all directions of gaze.  No nystagmus.  Mild right ptosis.   V:  Normal sensation VII:  Slight right facial weakness (history of Bell's palsy) with smile, facial muscles are intact bilaterally.   VIII:  Normal hearing and vestibular function.   IX-X:  Normal palatal movement.   XI:  Normal shoulder shrug and head rotation.   XII:  Normal tongue strength and range of motion, no deviation or fasciculation.  MOTOR:  No atrophy, fasciculations or abnormal movements.  No pronator drift.   Upper Extremity:  Right  Left  Deltoid  5/5   5/5   Biceps  5/5   5/5   Triceps  5/5   5/5   Wrist extensors  5/5   5/5   Wrist flexors  5/5   5/5   Finger extensors  5/5   5/5   Finger flexors  5/5   5/5   Dorsal interossei  5/5   5/5   Abductor pollicis  5/5   5/5   Tone  (Ashworth scale)  0  0   Lower Extremity:  Right  Left  Hip flexors  5/5   5/5   Knee flexors  5/5   5/5   Knee extensors  5/5   5/5   Dorsiflexors  5/5   5/5   Plantarflexors  5/5   5/5   Toe extensors  5/5   5/5   Toe flexors  5/5   5/5   Tone (Ashworth scale)  0  0   MSRs:                                           Right        Left brachioradialis 2+  2+  biceps 2+  2+  triceps 2+  2+  patellar 2+  2+  ankle jerk 2+  2+  Hoffman no  no  plantar response down  down   SENSORY:  Normal and symmetric perception of light touch, pinprick, vibration, and temperature in the extremities as well as the back.  There is no sensory level.  Romberg's sign absent.   COORDINATION/GAIT: Normal finger-to- nose-finger.  Intact rapid alternating movements bilaterally.  Gait narrow based and stable. Tandem and stressed gait intact.    Total time spent reviewing records, interview, history/exam, documentation, and coordination of care on day of encounter:  40 minutes    Thank you for allowing me to participate in patient's care.  If I can answer any additional questions, I would be pleased to do so.    Sincerely,    Marline Morace K. Allena Katz, DO

## 2023-12-12 NOTE — Telephone Encounter (Signed)
 Copied from CRM [id number]. Topic: Clinical - Medication Question >> Dec 12, 2023  9:34 AM Marlow Baars wrote: Reason for CRM: The patient states she has seen her Neurologist who states they are not sure why she is having the pain she is having. The patient states that the DULoxetine (CYMBALTA) 60 MG capsule she is on in the morning seems to help but then wears off by the evening. She is questioning is she can take the medicine at night or if the dosage should be increased. She also states she recently got back from [place] and the 5 hr time difference has been hard on her. Please assist patient further

## 2023-12-13 MED ORDER — ALPRAZOLAM 0.5 MG PO TABS: 0.5000 mg | ORAL_TABLET | Freq: Three times a day (TID) | ORAL | 0 refills | Status: AC | PRN

## 2023-12-13 MED ORDER — METHYLPHENIDATE HCL ER (LA) 10 MG PO CP24: 10.0000 mg | ORAL_CAPSULE | Freq: Every day | ORAL | 0 refills | Status: AC

## 2023-12-15 ENCOUNTER — Telehealth: Payer: Self-pay

## 2023-12-15 NOTE — Telephone Encounter (Signed)
 Copied from CRM [id number]. Topic: General - Other >> Dec 15, 2023  2:35 PM Emylou G wrote: Reason for CRM: patient called.. checking status from 3/10 call.. wants to know next steps since neurologist unable to do anything

## 2023-12-19 ENCOUNTER — Ambulatory Visit: Admitting: Neurology

## 2023-12-19 ENCOUNTER — Telehealth: Payer: Self-pay

## 2023-12-19 NOTE — Telephone Encounter (Signed)
 Copied from CRM [id number]. Topic: General - Other >> Dec 19, 2023  3:43 PM Eunice Blase wrote: Reason for CRM: Pt called would like a call back. Pt would not discuss with me only wants to discuss with nurse or doctor. Please call pt at [phone].

## 2023-12-22 MED ORDER — METHYLPHENIDATE HCL ER (LA) 10 MG PO CP24: 10.0000 mg | ORAL_CAPSULE | Freq: Every day | ORAL | 0 refills | Status: AC

## 2023-12-28 ENCOUNTER — Other Ambulatory Visit: Payer: Self-pay

## 2023-12-28 MED ORDER — DULOXETINE HCL 30 MG PO CPEP: 30.0000 mg | ORAL_CAPSULE | Freq: Three times a day (TID) | ORAL | 2 refills | Status: AC

## 2024-01-08 ENCOUNTER — Other Ambulatory Visit: Payer: Self-pay | Admitting: Family Medicine

## 2024-01-08 DIAGNOSIS — B9689 Other specified bacterial agents as the cause of diseases classified elsewhere: Secondary | ICD-10-CM

## 2024-01-10 NOTE — Telephone Encounter (Signed)
 Requested Prescriptions  Pending Prescriptions Disp Refills   fluticasone (FLONASE) 50 MCG/ACT nasal spray [Pharmacy Med Name: FLUTICASONE NASAL SP (120) RX] 16 g 0    Sig: SHAKE LIQUID AND USE 2 SPRAYS IN EACH NOSTRIL DAILY     Ear, Nose, and Throat: Nasal Preparations - Corticosteroids Passed - 01/10/2024  9:43 AM      Passed - Valid encounter within last 12 months    Recent Outpatient Visits           2 months ago Allodynia   [place] [facility] Donita Brooks, MD   8 months ago Neuropathy   [place] [facility] Donita Brooks, MD   9 months ago Polyarthralgia   [facility] Donita Brooks, MD   9 months ago Diffuse pain   [facility] Pickard, Priscille Heidelberg, MD   1 year ago GAD (generalized anxiety disorder)    [facility] Pickard, Priscille Heidelberg, MD

## 2024-03-09 ENCOUNTER — Telehealth: Admitting: Family Medicine

## 2024-03-09 DIAGNOSIS — J069 Acute upper respiratory infection, unspecified: Secondary | ICD-10-CM | POA: Diagnosis not present

## 2024-03-09 NOTE — Patient Instructions (Signed)
Fever, Adult     A fever is a high body temperature that is 100.31F (38C) or higher. Brief mild or moderate fevers generally have no lasting effects, and they often do not need treatment. Moderate or high fevers can feel uncomfortable and can sometimes be a sign of a serious problem. Fevers can also cause dehydration because the body may sweat, especially if the fever keeps coming back or lasts a long time. You can use a thermometer to check for a fever. Body temperature can change with: Age. Time of day. Where the temperature is taken, such as in the mouth, rectum, ear, under the arm, or on the forehead. Follow these instructions at home: Medicines Take over-the-counter and prescription medicines only as told by your health care provider. Follow instructions on how much medicine to take and how often. If you were prescribed antibiotics, take them as told by your provider. Do not stop using the antibiotic even if you start to feel better. General instructions Watch for any changes in your symptoms. Let your provider know about them. Rest as needed. Drink enough fluid to keep your pee (urine) pale yellow. This helps to prevent dehydration. Bathe or sponge bathe with room-temperature water to help lower your body temperature as needed. Do not use cold water. Do not use too many blankets or wear heavy clothes. Stay home from work and public places for at least 24 hours after your fever is gone. Your fever should be gone without having to use medicines. Contact a health care provider if: You have vomiting or diarrhea that does not get better. You cannot eat or drink without vomiting. You have pain when you pee (urinate). Your symptoms do not get better with treatment or you have new symptoms. You have a skin rash. You have signs of dehydration, such as: Dark pee, very little pee, or no pee. Cracked lips or dry mouth. Sunken eyes. Sleepiness. Weakness. Get help right away if: You have  shortness of breath or trouble breathing. You feel dizzy or you faint. You are confused and do not know the time of day, where you are, or who you are (disoriented). You have severe pain in your abdomen. These symptoms may be an emergency. Get help right away. Call 911. Do not wait to see if the symptoms will go away. Do not drive yourself to the hospital. This information is not intended to replace advice given to you by your health care provider. Make sure you discuss any questions you have with your health care provider. Document Revised: 06/22/2022 Document Reviewed: 06/22/2022 Elsevier Patient Education  2024 [employer].

## 2024-03-09 NOTE — Progress Notes (Signed)
 Virtual Visit Consent   Tracey Malone, you are scheduled for a virtual visit with a [facility] provider today. Just as with appointments in the office, your consent must be obtained to participate. Your consent will be active for this visit and any virtual visit you may have with one of our providers in the next 365 days. If you have a MyChart account, a copy of this consent can be sent to you electronically.  As this is a virtual visit, video technology does not allow for your provider to perform a traditional examination. This may limit your provider's ability to fully assess your condition. If your provider identifies any concerns that need to be evaluated in person or the need to arrange testing (such as labs, EKG, etc.), we will make arrangements to do so. Although advances in technology are sophisticated, we cannot ensure that it will always work on either your end or our end. If the connection with a video visit is poor, the visit may have to be switched to a telephone visit. With either a video or telephone visit, we are not always able to ensure that we have a secure connection.  By engaging in this virtual visit, you consent to the provision of healthcare and authorize for your insurance to be billed (if applicable) for the services provided during this visit. Depending on your insurance coverage, you may receive a charge related to this service.  I need to obtain your verbal consent now. Are you willing to proceed with your visit today? Tracey Malone has provided verbal consent on 03/09/2024 for a virtual visit (video or telephone). Albertha Huger, FNP  Date: 03/09/2024 9:35 AM   Virtual Visit via Video Note   I, Albertha Huger, connected with  Tracey Malone  ([id number], 01-29-1976) on 03/09/24 at  9:30 AM EDT by a video-enabled telemedicine application and verified that I am speaking with the correct person using two identifiers.  Location: Patient: Virtual Visit Location Patient:  Home Provider: Virtual Visit Location Provider: Home Office   I discussed the limitations of evaluation and management by telemedicine and the availability of in person appointments. The patient expressed understanding and agreed to proceed.    History of Present Illness: Tracey Malone is a 48 y.o. who identifies as a female who was assigned female at birth, and is being seen today for Head congestion, sinus pressure for 2 days with fever today of 102.5. No in home flu or covid. Husband was sick last week and sx resolved she reports after 3 doses of emycin. She has taken 2 doses of emycin. Aaron Aas  HPI: HPI  Problems:  Patient Active Problem List   Diagnosis Date Noted   Achilles tendinitis of right lower extremity 12/20/2022   Positive Tinel's sign 12/20/2022   Overweight (BMI 25.0-29.9) 05/27/2022   Plantar fasciitis, right 01/20/2022   Anxiety 10/27/2021   Fatty (change of) liver, not elsewhere classified 10/27/2021   Hot flashes 10/27/2021   Hyperlipidemia 10/27/2021   Leukocytosis 10/27/2021   Low libido 10/27/2021   Menopause 10/27/2021   Sleep disturbance 10/27/2021   Subclinical hypothyroidism 10/27/2021   Vaginal dryness 10/27/2021   Ganglion cyst of dorsum of right wrist 07/23/2021   Inflammatory pain 06/12/2021   Lateral epicondylitis, left elbow 02/26/2021   Pain of left forearm 02/25/2021   Injury of peroneal tendon of right foot 03/20/2018   Contracted, tendon sheath 10/06/2017   Chronic pain of right ankle 07/25/2017   Pain in right ankle  and joints of right foot 02/14/2017   Tear of talofibular ligament of right lower extremity 02/14/2017   Bell's palsy    UTI (urinary tract infection) 06/27/2013   GERD (gastroesophageal reflux disease) 06/27/2013   ADD (attention deficit disorder) 06/27/2013   Migraines     Allergies:  Allergies  Allergen Reactions   Codeine Shortness Of Breath   Topamax  [Topiramate ]     Cognitive Slowing--types same word over and over.  Cannot get out correct words when talking.   Treximet [Sumatriptan-Naproxen  Sodium] Shortness Of Breath and Swelling    Throat gets tight.   Penicillins Swelling    Swelling unknown  Has patient had a PCN reaction causing immediate rash, facial/tongue/throat swelling, SOB or lightheadedness with hypotension: Unknown Has patient had a PCN reaction causing severe rash involving mucus membranes or skin necrosis: No  Has patient had a PCN reaction that required hospitalization: No  Has patient had a PCN reaction occurring within the last 10 years: No  If all of the above answers are "NO", then may proceed with Cep   Medications:  Current Outpatient Medications:    ALPRAZolam  (XANAX ) 0.5 MG tablet, Take 1 tablet (0.5 mg total) by mouth 3 (three) times daily as needed for anxiety., Disp: 20 tablet, Rfl: 0   cyanocobalamin (,VITAMIN B-12,) 1000 MCG/ML injection, every 30 (thirty) days., Disp: , Rfl:    DULoxetine  (CYMBALTA ) 30 MG capsule, Take 1 capsule (30 mg total) by mouth in the morning, at noon, and at bedtime., Disp: 90 capsule, Rfl: 2   fluticasone  (FLONASE ) 50 MCG/ACT nasal spray, SHAKE LIQUID AND USE 2 SPRAYS IN EACH NOSTRIL DAILY, Disp: 16 g, Rfl: 0   methylphenidate  (RITALIN  LA) 10 MG 24 hr capsule, Take 1 capsule (10 mg total) by mouth daily., Disp: 30 capsule, Rfl: 0   methylphenidate  (RITALIN  LA) 10 MG 24 hr capsule, Take 1 capsule (10 mg total) by mouth daily., Disp: 30 capsule, Rfl: 0   pantoprazole  (PROTONIX ) 40 MG tablet, TAKE 1 TABLET(40 MG) BY MOUTH DAILY (Patient not taking: Reported on 12/12/2023), Disp: 30 tablet, Rfl: 3  Observations/Objective: Patient is well-developed, well-nourished in no acute distress.  Resting comfortably  at home.  Head is normocephalic, atraumatic.  No labored breathing.  Speech is clear and coherent with logical content.  Patient is alert and oriented at baseline.    Assessment and Plan: 1. Viral upper respiratory tract infection  (Primary)  Advised to be seen at urgent care for testing due to significant fever. Discussed viral illness.   Follow Up Instructions: I discussed the assessment and treatment plan with the patient. The patient was provided an opportunity to ask questions and all were answered. The patient agreed with the plan and demonstrated an understanding of the instructions.  A copy of instructions were sent to the patient via MyChart unless otherwise noted below.    The patient was advised to call back or seek an in-person evaluation if the symptoms worsen or if the condition fails to improve as anticipated.    Savir Blanke, FNP

## 2024-03-31 ENCOUNTER — Telehealth: Admitting: Nurse Practitioner

## 2024-03-31 DIAGNOSIS — N760 Acute vaginitis: Secondary | ICD-10-CM

## 2024-03-31 MED ORDER — FLUCONAZOLE 150 MG PO TABS
150.0000 mg | ORAL_TABLET | Freq: Once | ORAL | 0 refills | Status: AC
Start: 2024-03-31 — End: 2024-03-31

## 2024-03-31 NOTE — Progress Notes (Signed)
 I have spent 5 minutes in review of e-visit questionnaire, review and updating patient chart, medical decision making and response to patient.   Claiborne Rigg, NP

## 2024-03-31 NOTE — Progress Notes (Signed)
